# Patient Record
Sex: Female | Born: 1975 | Race: White | Hispanic: Yes | Smoking: Current some day smoker
Health system: Southern US, Community
[De-identification: ages and names within clinical notes are randomized; demographics above are authoritative.]

## PROBLEM LIST (undated history)

## (undated) DIAGNOSIS — G932 Benign intracranial hypertension: Secondary | ICD-10-CM

## (undated) DIAGNOSIS — K760 Fatty (change of) liver, not elsewhere classified: Secondary | ICD-10-CM

## (undated) DIAGNOSIS — F329 Major depressive disorder, single episode, unspecified: Secondary | ICD-10-CM

## (undated) DIAGNOSIS — M797 Fibromyalgia: Secondary | ICD-10-CM

## (undated) DIAGNOSIS — Z9889 Other specified postprocedural states: Secondary | ICD-10-CM

## (undated) DIAGNOSIS — E781 Pure hyperglyceridemia: Secondary | ICD-10-CM

## (undated) DIAGNOSIS — N12 Tubulo-interstitial nephritis, not specified as acute or chronic: Secondary | ICD-10-CM

## (undated) DIAGNOSIS — R112 Nausea with vomiting, unspecified: Secondary | ICD-10-CM

## (undated) DIAGNOSIS — N390 Urinary tract infection, site not specified: Secondary | ICD-10-CM

## (undated) DIAGNOSIS — M1990 Unspecified osteoarthritis, unspecified site: Secondary | ICD-10-CM

## (undated) DIAGNOSIS — I10 Essential (primary) hypertension: Secondary | ICD-10-CM

## (undated) DIAGNOSIS — F32A Depression, unspecified: Secondary | ICD-10-CM

## (undated) DIAGNOSIS — D649 Anemia, unspecified: Secondary | ICD-10-CM

## (undated) DIAGNOSIS — E282 Polycystic ovarian syndrome: Secondary | ICD-10-CM

## (undated) DIAGNOSIS — M5430 Sciatica, unspecified side: Secondary | ICD-10-CM

## (undated) DIAGNOSIS — N7092 Oophoritis, unspecified: Secondary | ICD-10-CM

## (undated) DIAGNOSIS — F419 Anxiety disorder, unspecified: Secondary | ICD-10-CM

## (undated) DIAGNOSIS — K219 Gastro-esophageal reflux disease without esophagitis: Secondary | ICD-10-CM

## (undated) DIAGNOSIS — R519 Headache, unspecified: Secondary | ICD-10-CM

## (undated) HISTORY — DX: Major depressive disorder, single episode, unspecified: F32.9

## (undated) HISTORY — DX: Anxiety disorder, unspecified: F41.9

## (undated) HISTORY — DX: Fatty (change of) liver, not elsewhere classified: K76.0

## (undated) HISTORY — DX: Pure hyperglyceridemia: E78.1

## (undated) HISTORY — DX: Essential (primary) hypertension: I10

## (undated) HISTORY — DX: Depression, unspecified: F32.A

## (undated) HISTORY — DX: Fibromyalgia: M79.7

## (undated) HISTORY — DX: Unspecified osteoarthritis, unspecified site: M19.90

## (undated) HISTORY — DX: Polycystic ovarian syndrome: E28.2

## (undated) HISTORY — PX: TUBAL LIGATION: SHX77

## (undated) HISTORY — PX: OTHER SURGICAL HISTORY: SHX169

---

## 2012-01-28 ENCOUNTER — Emergency Department (HOSPITAL_COMMUNITY)
Admission: EM | Admit: 2012-01-28 | Discharge: 2012-01-28 | Discharge status: Against Medical Advice | Payer: Self-pay | Attending: Emergency Medicine | Admitting: Emergency Medicine

## 2012-01-28 ENCOUNTER — Encounter (HOSPITAL_COMMUNITY): Payer: Self-pay | Admitting: Emergency Medicine

## 2012-01-28 DIAGNOSIS — R109 Unspecified abdominal pain: Secondary | ICD-10-CM | POA: Insufficient documentation

## 2012-01-28 DIAGNOSIS — R55 Syncope and collapse: Secondary | ICD-10-CM | POA: Insufficient documentation

## 2012-01-28 DIAGNOSIS — R110 Nausea: Secondary | ICD-10-CM | POA: Insufficient documentation

## 2012-01-28 HISTORY — DX: Oophoritis, unspecified: N70.92

## 2012-01-28 NOTE — ED Notes (Signed)
Pt states she cannot wait any longer, needs to get home to her children.  Advised to return for worsening of sx.  Pt alert and oriented, ambulatory

## 2012-01-28 NOTE — ED Notes (Signed)
Pt reports right flank pain that radiated to lower right abd and middle right back area onset thursdays past denies dysuria but admits to nausea no vomiting. Had sharp pain this afternoon causing near syncope and patient came for eval of symptoms.

## 2013-05-06 DIAGNOSIS — D352 Benign neoplasm of pituitary gland: Secondary | ICD-10-CM | POA: Insufficient documentation

## 2013-05-26 ENCOUNTER — Ambulatory Visit (INDEPENDENT_AMBULATORY_CARE_PROVIDER_SITE_OTHER): Payer: BC Managed Care – PPO | Admitting: Endocrinology

## 2013-05-26 ENCOUNTER — Encounter: Payer: Self-pay | Admitting: Endocrinology

## 2013-05-26 VITALS — BP 124/70 | HR 86 | Temp 98.2°F | Resp 12 | Ht 62.0 in | Wt 235.9 lb

## 2013-05-26 DIAGNOSIS — E229 Hyperfunction of pituitary gland, unspecified: Secondary | ICD-10-CM

## 2013-05-26 DIAGNOSIS — F329 Major depressive disorder, single episode, unspecified: Secondary | ICD-10-CM

## 2013-05-26 DIAGNOSIS — E221 Hyperprolactinemia: Secondary | ICD-10-CM | POA: Insufficient documentation

## 2013-05-26 NOTE — Progress Notes (Signed)
Patient ID: Angela Massey, female   DOB: [birth year], 37 y.o.   MRN: [id number]   Chief complaint:  Irregular menstrual cycles  History of Present Illness:  The patient stopped having menstrual cycles this summer for about 3 months. Previously she would have fairly regular menstrual cycles although in the early part of the year she had 2 cycles in a month. About 6 weeks ago or so she then started noticing milk discharge from her breasts also. This would be spontaneous and with cause wetting of her underclothing. 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  She does complain of feeling tired but this is not new More recently, for about 6 months she has been getting cold more recently  LABS: Prolactin on 05/06/13 was 143.7 Free T4, TSH, LH, FSH, ACTH, cortisol, estradiol and IGF-1 are normal   She is now referred here for further evaluation by her neurosurgeon   Past Medical History  Diagnosis Date  . Ovarian abscess   . Depression     Past Surgical History  Procedure Laterality Date  . Abdominal surgery    . Tubal ligation      Family History  Problem Relation Age of Onset  . Diabetes Maternal Grandmother     Social History:  reports that she has been smoking.  She does not have any smokeless tobacco history on file. She reports that she does not drink alcohol or use illicit drugs.  Allergies:  Allergies  Allergen Reactions  . Codeine Hives  . Phenergan [Promethazine Hcl]     convulsions      Medication List       This list is accurate as of:  05/26/13  9:53 AM.  Always use your most recent med list.               benztropine 0.5 MG tablet  Commonly known as:  COGENTIN  Take 0.5 mg by mouth daily.     buPROPion 150 MG 12 hr tablet  Commonly known as:  WELLBUTRIN SR  Take 150 mg by mouth 3 (three) times daily.     ferrous sulfate 325 (65 FE) MG tablet  Take 325 mg by mouth daily with breakfast.     FLUoxetine 20 MG capsule  Commonly known as:  PROZAC  Take 60 mg by mouth daily.     furosemide 20 MG tablet  Commonly known as:  LASIX  Take 20 mg by mouth daily.     ibuprofen 200 MG tablet  Commonly known as:  ADVIL,MOTRIN  Take 800 mg by mouth every 6 (six) hours as needed. For pain     pravastatin 20 MG tablet  Commonly known as:  PRAVACHOL  Take 20 mg by mouth daily.     risperiDONE 2 MG tablet  Commonly known as:  RISPERDAL  Take 2 mg by mouth at bedtime. 2 tablets at bedtime     Vitamin D3 5000 UNITS Tabs  Take 5,000 mg by mouth.         REVIEW OF SYSTEMS:  She has had occasional headaches, not worse now.       She may occasionally feel a little dizzy     Eyes: No visual disturbance        Skin: No rash or pigmentation. She has had mild chronic acne. Does not complain of unusual development of facial hair     His blood pressure has been normal.     No swelling of feet.     Bowel habits:  No change. She does complain of mild chronic nausea.      She had difficulty conceiving her first child and was told to have polycystic ovarian syndrome at that time with a normal testosterone level. Most treated with metformin for a short time. Her maximum weight was 260 pounds. She lost about 60 pounds prior to her second pregnancy and had no difficulty with conceiving. Her last child was 6 years ago  No history of diabetes   PHYSICAL EXAM:  BP 124/70  Pulse 86  Temp(Src) 98.2 F (36.8 C)  Resp 12  Ht 5\' 2"  (1.575 m)  Wt 235 lb 14.4 oz (107.004 kg)  BMI 43.14 kg/m2  SpO2 96%  LMP  05/18/2013  GENERAL: Generalized obesity present. No cushingoid features  No pallor, clubbing or edema.  Skin:  no rash or pigmentation.  EYES:  Externally normal.  Fundii:  normal discs and vessels.  ENT: Oral mucosa and tongue normal.  THYROID:  Not palpable. No cervical lymphadenopathy  HEART:  Normal  S1 and S2; no murmur or click.  CHEST:  Normal shape. Lungs:Vescicular breath sounds heard equally.  No crepitations/ wheeze. Breast exam not indicated  ABDOMEN:  No distention. No skin changes. Liver and spleen not palpable.  No other mass or tenderness.  NEUROLOGICAL: .Reflexes are normal bilaterally at biceps.  SPINE AND JOINTS:  Normal.   ASSESSMENT:    Hyperprolactinemia associated with starting Risperdal about 6 months ago Oligomenorrhea and galactorrhea secondary to her hyperprolactinemia Although she missed menstrual cycles for 3 months she has had a menstrual cycle this month Is unclear whether she has a condition secondary to her using Risperdal which is well known to cause high levels of prolactin or she has a coincidental prolactinoma which was asymptomatic previously.  She currently does have problems with chronic nausea and discussed potential treatment of hyperprolactinemia with dopaminergic drugs which would possibly cause nausea also  PLAN:   It would be preferable that she go off Risperdal and try a different antipsychotic/antidepressant drug that would have less propensity to hyperprolactinemia. She would discuss this with her PCP today Will reassess her prolactin levels and symptoms in about 2 months after stopping this medication and consider treatment if prolactin is persistently high Patient and husband agree with this plan  [facility] 05/26/2013, 9:53 AM

## 2013-05-26 NOTE — Patient Instructions (Signed)
Try to have Risperdal changed

## 2013-07-27 ENCOUNTER — Ambulatory Visit (INDEPENDENT_AMBULATORY_CARE_PROVIDER_SITE_OTHER): Payer: BC Managed Care – PPO | Admitting: Endocrinology

## 2013-07-27 ENCOUNTER — Encounter: Payer: Self-pay | Admitting: Endocrinology

## 2013-07-27 VITALS — BP 110/70 | HR 82 | Temp 98.3°F | Resp 16 | Ht 62.0 in | Wt 239.6 lb

## 2013-07-27 DIAGNOSIS — E221 Hyperprolactinemia: Secondary | ICD-10-CM

## 2013-07-27 DIAGNOSIS — E229 Hyperfunction of pituitary gland, unspecified: Secondary | ICD-10-CM

## 2013-07-27 NOTE — Progress Notes (Signed)
Patient ID: Angela Massey, female   DOB: 03/07/1976, 38 y.o.   MRN: [id number]   Chief complaint:  Followup of high prolactin  History of Present Illness:  She was evaluated in 05/2013 for hyperprolactinemia Patient was symptomatic with galactorrhea and also had stopped having menstrual cycles in summer for about 3 months. Previously she would have fairly regular menstrual cycles although in the early part of 2014 she had 2 cycles in a month. 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 Ovarian abscess   . Depression     Past Surgical History  Procedure Laterality Date  . Abdominal surgery    . Tubal ligation      Family History  Problem Relation Age of Onset  . Diabetes Maternal Grandmother     Social History:  reports that she has been smoking.  She does not have any smokeless tobacco history on file. She reports that she does not drink alcohol or use illicit drugs.  Allergies:  Allergies  Allergen Reactions  . Codeine Hives  . Phenergan [Promethazine Hcl]     convulsions      Medication List       This list is accurate as of: 07/27/13  9:17 AM.  Always use your most recent med list.               ARIPiprazole 15 MG tablet  Commonly known as:  ABILIFY  Take 15 mg by mouth daily.     benztropine 0.5 MG tablet  Commonly known as:  COGENTIN  Take 0.5 mg by mouth daily.     buPROPion 150 MG 12 hr tablet  Commonly known as:  WELLBUTRIN SR  Take 150 mg by mouth 3 (three) times daily.     ferrous  sulfate 325 (65 FE) MG tablet  Take 325 mg by mouth daily with breakfast.     FLUoxetine 20 MG capsule  Commonly known as:  PROZAC  Take 60 mg by mouth daily.     furosemide 20 MG tablet  Commonly known as:  LASIX  Take 20 mg by mouth daily.     ibuprofen 200 MG tablet  Commonly known as:  ADVIL,MOTRIN  Take 800 mg by mouth every 6 (six) hours as needed. For pain     pravastatin 20 MG tablet  Commonly known as:  PRAVACHOL  Take 20 mg by mouth daily.     Vitamin D3 5000 UNITS Tabs  Take 5,000 mg by mouth.         REVIEW OF SYSTEMS:            She has had occasional headaches, not worse now     No history of diabetes      Depression is controlled currently  PHYSICAL EXAM:  BP 110/70  Pulse 82  Temp(Src) 98.3 F (36.8 C)  Resp 16  Ht 5\' 2"  (1.575 m)  Wt 239 lb 9.6 oz (108.682 kg)  BMI 43.81 kg/m2  SpO2 98%  LMP  07/24/2013   ASSESSMENT/PLAN:    Hyperprolactinemia likely to have been associated with use of Risperdal in 2014 Oligomenorrhea and galactorrhea secondary to her hyperprolactinemia appears to be resolving and she has regular menstrual cycles now She also does have a known 4 mm possible pituitary adenoma She is currently taking Abilify which is usually not associated with significant prolactin elevation  Will check her prolactin level today and decide on further management   Shernita Rabinovich 07/27/2013, 9:17 AM

## 2013-07-27 NOTE — Patient Instructions (Signed)
Call if skipping cycles regularly

## 2013-07-28 LAB — PROLACTIN: Prolactin: 22.7 ng/mL (ref 4.8–23.3)

## 2013-07-28 NOTE — Progress Notes (Signed)
Quick Note:  Please let patient know that the lab result is normal and no further action needed. To followup only as needed ______

## 2013-07-31 ENCOUNTER — Encounter: Payer: Self-pay | Admitting: *Deleted

## 2014-05-03 ENCOUNTER — Encounter: Payer: Self-pay | Admitting: Endocrinology

## 2014-07-29 ENCOUNTER — Ambulatory Visit: Payer: Self-pay | Admitting: Podiatry

## 2014-08-02 ENCOUNTER — Ambulatory Visit (INDEPENDENT_AMBULATORY_CARE_PROVIDER_SITE_OTHER): Payer: 59

## 2014-08-02 ENCOUNTER — Ambulatory Visit (INDEPENDENT_AMBULATORY_CARE_PROVIDER_SITE_OTHER): Payer: 59 | Admitting: Podiatry

## 2014-08-02 ENCOUNTER — Encounter: Payer: Self-pay | Admitting: Podiatry

## 2014-08-02 VITALS — BP 112/62 | HR 79 | Resp 16

## 2014-08-02 DIAGNOSIS — M722 Plantar fascial fibromatosis: Secondary | ICD-10-CM

## 2014-08-02 MED ORDER — DICLOFENAC SODIUM 75 MG PO TBEC: 75.0000 mg | DELAYED_RELEASE_TABLET | Freq: Two times a day (BID) | ORAL | Status: DC

## 2014-08-02 MED ORDER — TRIAMCINOLONE ACETONIDE 10 MG/ML IJ SUSP
10.0000 mg | Freq: Once | INTRAMUSCULAR | Status: AC
  Administered 2014-08-02: 10 mg

## 2014-08-02 NOTE — Patient Instructions (Signed)

## 2014-08-02 NOTE — Progress Notes (Signed)
Subjective:     Patient ID: Angela Massey, female   DOB: 1975/12/24, 39 y.o.   MRN: [id number]  HPI patient presents with pain in the left heel ration and mild discomfort on the right. Has tried night splint has tried ice and anti-inflammatories without relief and states the pain has worsened over these 3 months and she wants to be active but is unable to   Review of Systems  All other systems reviewed and are negative.      Objective:   Physical Exam  Constitutional: She is oriented to person, place, and time.  Cardiovascular: Intact distal pulses.   Musculoskeletal: Normal range of motion.  Neurological: She is oriented to person, place, and time.  Skin: Skin is warm and dry.  Nursing note and vitals reviewed.  neurovascular status intact with muscle strength adequate and range of motion subtalar midtarsal joint within normal limits. Patient is noted to have good digital perfusion is well oriented 3 with mild equinus condition noted. Patient is noted to have exquisite discomfort plantar aspect left heel at the insertion of the tendon into the calcaneus and mild to moderate discomfort of the right arch     Assessment:     Severe plantar fasciitis left with inflammatory component and arch pain right    Plan:     H&P and x-rays reviewed and today I injected the left plantar fascia 3 mg Kenalog 5 mg Xylocaine and applied fascial brace. Placed on Voltaren 75 mg twice a day and instructed on physical therapy and reappoint one week also discussed long-term orthotic therapy for this patient

## 2014-08-02 NOTE — Progress Notes (Signed)
   Subjective:    Patient ID: Angela Massey, female    DOB: 05-01-1976, 39 y.o.   MRN: [id number]  HPI Comments: "I have pain the heel and some arch"  Patient c/o aching plantar heel and some arch left for 3 months. AM pain. Walking a lot makes worse. Been using night splint-only helped AM. She takes hydrocodone for fibromyalgia and takes Aleve-no help.   Foot Pain Associated symptoms include diaphoresis, fatigue, myalgias and weakness.      Review of Systems  Constitutional: Positive for diaphoresis and fatigue.  HENT: Positive for hearing loss.   Eyes: Positive for visual disturbance.  Musculoskeletal: Positive for myalgias, back pain and gait problem.  Neurological: Positive for weakness and light-headedness.  Hematological: Bruises/bleeds easily.  All other systems reviewed and are negative.      Objective:   Physical Exam        Assessment & Plan:

## 2014-08-05 ENCOUNTER — Ambulatory Visit (INDEPENDENT_AMBULATORY_CARE_PROVIDER_SITE_OTHER): Payer: 59 | Admitting: Podiatry

## 2014-08-05 ENCOUNTER — Encounter: Payer: Self-pay | Admitting: Podiatry

## 2014-08-05 VITALS — BP 105/55 | HR 73 | Resp 16

## 2014-08-05 DIAGNOSIS — M722 Plantar fascial fibromatosis: Secondary | ICD-10-CM

## 2014-08-05 MED ORDER — TRIAMCINOLONE ACETONIDE 10 MG/ML IJ SUSP
10.0000 mg | Freq: Once | INTRAMUSCULAR | Status: AC
  Administered 2014-08-05: 10 mg

## 2014-08-08 NOTE — Progress Notes (Signed)
Subjective:     Patient ID: Angela Massey, female   DOB: 10-Jun-1976, 39 y.o.   MRN: [id number]  HPI patient states that the pain has improved some but it still quite sore with quite a bit a depression of my arch and feeling of instability   Review of Systems     Objective:   Physical Exam Neurovascular status intact with continued discomfort left plantar fascia with improvement more apparent on the right. Patient does have depression of the arch collapse medial longitudinal arch which is contributory    Assessment:     Plantar fasciitis left with inflammation noted and structural deficit of the foot    Plan:     Reviewed physical therapy supportive shoe and reinjected the plantar fascia 3 mg Kenalog 5 mg Xylocaine and also today scanned for custom orthotic devices

## 2014-08-09 ENCOUNTER — Ambulatory Visit: Payer: 59 | Admitting: Podiatry

## 2014-08-20 ENCOUNTER — Ambulatory Visit: Payer: 59 | Admitting: *Deleted

## 2014-08-20 DIAGNOSIS — M722 Plantar fascial fibromatosis: Secondary | ICD-10-CM

## 2014-08-20 NOTE — Patient Instructions (Signed)

## 2014-08-20 NOTE — Progress Notes (Signed)
Patient ID: Angela Massey, female   DOB: April 26, 1976, 39 y.o.   MRN: [id number] PICKING UP MY INSERTS

## 2014-09-14 ENCOUNTER — Ambulatory Visit (INDEPENDENT_AMBULATORY_CARE_PROVIDER_SITE_OTHER): Payer: 59 | Admitting: Podiatry

## 2014-09-14 ENCOUNTER — Encounter: Payer: Self-pay | Admitting: Podiatry

## 2014-09-14 VITALS — BP 120/67 | HR 83 | Resp 18

## 2014-09-14 DIAGNOSIS — M722 Plantar fascial fibromatosis: Secondary | ICD-10-CM

## 2014-09-14 MED ORDER — TRIAMCINOLONE ACETONIDE 10 MG/ML IJ SUSP
10.0000 mg | Freq: Once | INTRAMUSCULAR | Status: AC
  Administered 2014-09-14: 10 mg

## 2014-09-14 NOTE — Progress Notes (Signed)
Subjective:     Patient ID: Angela Massey, female   DOB: 1976-01-25, 39 y.o.   MRN: [id number]  HPI patient presents stating that the heel is not hurting as much on the inside but is hurting more in the center in the outside and it is at a point where she simply cannot bear weight. Patient is obese which is certainly a complicating factor   Review of Systems     Objective:   Physical Exam Neurovascular status intact no change in health history was severe discomfort central and lateral band of the plantar fascia with the medial band been reasonably improved    Assessment:     Improved plantar fasciitis left with pain that still present but not to the same intensity with severe intensity in the lateral fascial band insertional point calcaneus    Plan:     Reviewed condition at great length and due to the intensity of discomfort I recommended immobilization. Patient is injected from the lateral side 3 mg Kenalog 5 mg Xylocaine and air fracture walker was dispensed with instructions on usage. Reappoint to recheck in 4 weeks

## 2014-10-11 ENCOUNTER — Encounter: Payer: Self-pay | Admitting: Podiatry

## 2014-10-11 ENCOUNTER — Ambulatory Visit (INDEPENDENT_AMBULATORY_CARE_PROVIDER_SITE_OTHER): Payer: 59 | Admitting: Podiatry

## 2014-10-11 DIAGNOSIS — M722 Plantar fascial fibromatosis: Secondary | ICD-10-CM | POA: Diagnosis not present

## 2014-10-11 NOTE — Patient Instructions (Signed)
Pre-Operative Instructions  Congratulations, you have decided to take an important step to improving your quality of life.  You can be assured that the doctors of [facility] will be with you every step of the way.  1. Plan to be at the surgery center/hospital at least 1 (one) hour prior to your scheduled time unless otherwise directed by the surgical center/hospital staff.  You must have a responsible adult accompany you, remain during the surgery and drive you home.  Make sure you have directions to the surgical center/hospital and know how to get there on time. 2. For hospital based surgery you will need to obtain a history and physical form from your family physician within 1 month prior to the date of surgery- we will give you a form for you primary physician.  3. We make every effort to accommodate the date you request for surgery.  There are however, times where surgery dates or times have to be moved.  We will contact you as soon as possible if a change in schedule is required.   4. No Aspirin/Ibuprofen for one week before surgery.  If you are on aspirin, any non-steroidal anti-inflammatory medications (Mobic, Aleve, Ibuprofen) you should stop taking it 7 days prior to your surgery.  You make take Tylenol  For pain prior to surgery.  5. Medications- If you are taking daily heart and blood pressure medications, seizure, reflux, allergy, asthma, anxiety, pain or diabetes medications, make sure the surgery center/hospital is aware before the day of surgery so they may notify you which medications to take or avoid the day of surgery. 6. No food or drink after midnight the night before surgery unless directed otherwise by surgical center/hospital staff. 7. No alcoholic beverages 24 hours prior to surgery.  No smoking 24 hours prior to or 24 hours after surgery. 8. Wear loose pants or shorts- loose enough to fit over bandages, boots, and casts. 9. No slip on shoes, sneakers are best. 10. Bring  your boot with you to the surgery center/hospital.  Also bring crutches or a walker if your physician has prescribed it for you.  If you do not have this equipment, it will be provided for you after surgery. 11. If you have not been contracted by the surgery center/hospital by the day before your surgery, call to confirm the date and time of your surgery. 12. Leave-time from work may vary depending on the type of surgery you have.  Appropriate arrangements should be made prior to surgery with your employer. 13. Prescriptions will be provided immediately following surgery by your doctor.  Have these filled as soon as possible after surgery and take the medication as directed. 14. Remove nail polish on the operative foot. 15. Wash the night before surgery.  The night before surgery wash the foot and leg well with the antibacterial soap provided and water paying special attention to beneath the toenails and in between the toes.  Rinse thoroughly with water and dry well with a towel.  Perform this wash unless told not to do so by your physician.  Enclosed: 1 Ice pack (please put in freezer the night before surgery)   1 Hibiclens skin cleaner   Pre-op Instructions  If you have any questions regarding the instructions, do not hesitate to call our office.  [place]: [address] [place], [place] [address] [phone]  [place]: [address] [place], [place] [address] [phone]  [place]: [address]  [place],  [address] [phone]  Dr. Richard   Tuchman DPM, Dr. Norman Regal DPM Dr. Richard Sikora DPM, Dr. M. Todd Hyatt DPM, Dr. Kathryn Egerton DPM 

## 2014-10-12 ENCOUNTER — Ambulatory Visit: Payer: 59 | Admitting: Podiatry

## 2014-10-12 NOTE — Progress Notes (Signed)
Subjective:     Patient ID: Angela Massey, female   DOB: 07-23-1975, 39 y.o.   MRN: [id number]  HPI patient states my left heel is killing me and I need to be able to walk but cannot because of the pain   Review of Systems     Objective:   Physical Exam Neurovascular status intact muscle strength adequate with severe discomfort left plantar heel at the insertional point of the tendon into the calcaneus    Assessment:     Long-term plantar fasciitis left which has not responded to conservative care that we have tried including complete immobilization    Plan:     Reviewed condition and discussed conservative and surgical treatments. Due to long-standing nature and failure to respond she is opted for surgical intervention and I have recommended endoscopic release of the plantar fascia medial and central band. I reviewed with her alternative treatments and complications and she after review wants surgery understanding risk and signs consent form. She is scheduled for outpatient surgery [facility] specialty surgical center at this time and will have done in the next several weeks and was given all preoperative instructions and was encouraged to call us if she should have any questions prior to procedure. Understands total recovery. Can take 6 months to one year

## 2014-10-19 ENCOUNTER — Encounter: Payer: Self-pay | Admitting: Podiatry

## 2014-10-19 DIAGNOSIS — M722 Plantar fascial fibromatosis: Secondary | ICD-10-CM | POA: Diagnosis not present

## 2014-10-21 NOTE — Progress Notes (Signed)
DOS 10/19/2014 left medial and central endoscopic plantar fasciotomy

## 2014-10-25 ENCOUNTER — Encounter: Payer: Self-pay | Admitting: Podiatry

## 2014-10-25 ENCOUNTER — Ambulatory Visit (INDEPENDENT_AMBULATORY_CARE_PROVIDER_SITE_OTHER): Payer: 59 | Admitting: Podiatry

## 2014-10-25 DIAGNOSIS — M722 Plantar fascial fibromatosis: Secondary | ICD-10-CM

## 2014-10-26 NOTE — Progress Notes (Signed)
Subjective:     Patient ID: Angela Massey, female   DOB: 07-17-1975, 39 y.o.   MRN: [id number]  HPI patient states I'm doing really well with minimal discomfort and able to walk without pain or swelling   Review of Systems     Objective:   Physical Exam Neurovascular status intact with in incision sites well coapted upon removal of dressing with stitches in place and negative Homans sign noted left. Minimal discomfort to palpation of the plantar heel    Assessment:     Doing well post endoscopic surgery left    Plan:     Reapplied sterile dressing instructed on continued immobilization elevation and reappoint 2 weeks for suture removal or earlier if any issues should occur

## 2014-11-08 ENCOUNTER — Ambulatory Visit (INDEPENDENT_AMBULATORY_CARE_PROVIDER_SITE_OTHER): Payer: 59 | Admitting: Podiatry

## 2014-11-08 VITALS — BP 114/66 | HR 88 | Resp 14

## 2014-11-08 DIAGNOSIS — M722 Plantar fascial fibromatosis: Secondary | ICD-10-CM

## 2014-11-08 NOTE — Progress Notes (Signed)
   Subjective:    Patient ID: Angela Massey, female    DOB: [birth year], 39 y.o.   MRN: [id number]  HPI Comments: DOS 10/19/2014 left central and medial endoscopic plantar fasciotomy     Review of Systems     Objective:   Physical Exam        Assessment & Plan:

## 2014-11-08 NOTE — Progress Notes (Signed)
   Subjective:    Patient ID: Angela Massey, female    DOB: [birth year], 39 y.o.   MRN: [id number]  HPI Comments: DOS 04/19/'2016 left central and medial endoscopic plantar fasciotomy.  I remove the remaining 2 sutures at the lateral heel surgical site, pt states the medial sutures have fallen out.  I instructed pt to begin showers 24 -48 hours after the suture removal today, and to shower at the end of the day and begin the XXL anklet the following morning after the 1st shower.  I encouraged pt to remain in the Air Fracture Walker until seen in two weeks, then may begin the athletic shoe.     Review of Systems     Objective:   Physical Exam        Assessment & Plan:

## 2014-11-22 ENCOUNTER — Ambulatory Visit (INDEPENDENT_AMBULATORY_CARE_PROVIDER_SITE_OTHER): Payer: 59 | Admitting: Podiatry

## 2014-11-22 DIAGNOSIS — M722 Plantar fascial fibromatosis: Secondary | ICD-10-CM

## 2014-11-23 NOTE — Progress Notes (Signed)
Subjective:     Patient ID: Angela Massey, female   DOB: 01-Mar-1976, 39 y.o.   MRN: [id number]  HPI patient states I'm doing real well with minimal discomfort   Review of Systems     Objective:   Physical Exam Neurovascular status intact wound edges well healed on the medial lateral side left heel post endoscopic surgery    Assessment:     Doing well post endoscopic release medial and central band of the left plantar fascia    Plan:     Advised patient's doing well and to increase activity levels at this time. Continue to wear her boot on an as-needed basis and reappoint if any symptoms were to indicate

## 2015-04-14 ENCOUNTER — Ambulatory Visit (HOSPITAL_COMMUNITY): Payer: Self-pay | Admitting: Psychology

## 2015-05-25 ENCOUNTER — Encounter (HOSPITAL_COMMUNITY): Payer: Self-pay | Admitting: Emergency Medicine

## 2015-05-25 ENCOUNTER — Emergency Department (HOSPITAL_COMMUNITY)
Admission: EM | Admit: 2015-05-25 | Discharge: 2015-05-25 | Disposition: A | Discharge status: Home / Self Care | Payer: 59 | Attending: Emergency Medicine | Admitting: Emergency Medicine

## 2015-05-25 DIAGNOSIS — M5440 Lumbago with sciatica, unspecified side: Secondary | ICD-10-CM | POA: Diagnosis not present

## 2015-05-25 DIAGNOSIS — F319 Bipolar disorder, unspecified: Secondary | ICD-10-CM | POA: Diagnosis not present

## 2015-05-25 DIAGNOSIS — Z8742 Personal history of other diseases of the female genital tract: Secondary | ICD-10-CM | POA: Insufficient documentation

## 2015-05-25 DIAGNOSIS — M5441 Lumbago with sciatica, right side: Secondary | ICD-10-CM

## 2015-05-25 DIAGNOSIS — F17200 Nicotine dependence, unspecified, uncomplicated: Secondary | ICD-10-CM | POA: Diagnosis not present

## 2015-05-25 DIAGNOSIS — M5442 Lumbago with sciatica, left side: Secondary | ICD-10-CM

## 2015-05-25 DIAGNOSIS — M797 Fibromyalgia: Secondary | ICD-10-CM | POA: Insufficient documentation

## 2015-05-25 DIAGNOSIS — M545 Low back pain: Secondary | ICD-10-CM | POA: Diagnosis present

## 2015-05-25 MED ORDER — KETOROLAC TROMETHAMINE 60 MG/2ML IM SOLN
60.0000 mg | Freq: Once | INTRAMUSCULAR | Status: AC
  Administered 2015-05-25: 60 mg via INTRAMUSCULAR
  Filled 2015-05-25: qty 2

## 2015-05-25 MED ORDER — PREDNISONE 20 MG PO TABS: ORAL_TABLET | ORAL | Status: DC

## 2015-05-25 MED ORDER — DIAZEPAM 5 MG PO TABS: 10.0000 mg | ORAL_TABLET | Freq: Once | ORAL | Status: DC

## 2015-05-25 NOTE — ED Notes (Signed)
C/O LBP x 1.5 weeks . Saw an orthopedist then. States pain has gotten worse.

## 2015-05-25 NOTE — ED Notes (Signed)
Patient states back pain x 1 week with worsening every day.   Patient states the pain started radiating down into legs x 2 days ago.   Patient is with pain clinic for chronic problems (fibromyalgia).  Patient denies any urinary symptoms or loss of continence.

## 2015-05-25 NOTE — ED Provider Notes (Signed)
CSN: [id number]     Arrival date & time 05/25/15  1113 History  By signing my name below, I, Evelene Croon, attest that this documentation has been prepared under the direction and in the presence of non-physician practitioner, Clayton Bibles, PA-C. Electronically Signed: Evelene Croon, Scribe. 05/25/2015. 11:29 AM.  Chief Complaint  Patient presents with  . Back Pain  . Leg Pain   The history is provided by the patient. No language interpreter was used.   HPI Comments:  Angela Massey is a 39 y.o. female with a history of fibromyalgia, who presents to the Emergency Department complaining of moderate low back pain that radiates down her posterior BLE for a few weeks, worsening yesterday. She denies back injury and notes her pain is greater on the right than the left. She notes increased pain with movement and when bending over. Pt notes she has a pain contract and is taking oxycodone, gabapentin, meloxicam, Cymbalta, and flexeril which she has been taking with little relief. Pt also reports swelling to her BLE since yesterday. She has a h/o similar milder swelling but notes it usually resolves on its own. She has been seen by ortho and was told she had a sprain of her right ankle and was discharged with steroids which she finished yesterday. She denies SOB, CP, fever, abdominal pain, urinary symptoms, weakness in her extremites and bowel/bladder incontinence. She also denies h/o surgery to her lymph nodes, recent long periods of immobiliztion, recent surgery, hormone replacement therapy, family hx or personal hx of blood clots .  Past Medical History  Diagnosis Date  . Ovarian abscess   . Depression   . Bipolar 1 disorder ([place]) 10/2014   Past Surgical History  Procedure Laterality Date  . Abdominal surgery    . Tubal ligation     Family History  Problem Relation Age of Onset  . Diabetes Maternal Grandmother    Social History  Substance Use Topics  . Smoking status: Current Every Day  Smoker  . Smokeless tobacco: Current User  . Alcohol Use: No   OB History    Gravida Para Term Preterm AB TAB SAB Ectopic Multiple Living   4 3 3       3      Review of Systems  Constitutional: Negative for fever and chills.  Respiratory: Negative for shortness of breath.   Cardiovascular: Negative for chest pain.    Allergies  Codeine and Phenergan  Home Medications   Prior to Admission medications   Medication Sig Start Date End Date Taking? Authorizing Provider  cyclobenzaprine (FLEXERIL) 10 MG tablet As needed 10/07/14   Historical Provider, MD  Divalproex Sodium (DEPAKOTE ER PO) Take 1,000 mg by mouth daily. At bedtime    Historical Provider, MD  HYDROcodone-acetaminophen (NORCO) 7.5-325 MG per tablet Take 1 tablet by mouth every 6 (six) hours as needed for moderate pain.    Historical Provider, MD  lamoTRIgine (LAMICTAL) 25 MG tablet Take 25 mg by mouth daily. Every other night for a couple of weeks    Historical Provider, MD   BP 115/67 mmHg  Pulse 97  Temp(Src) 98.2 F (36.8 C) (Oral)  Resp 20  Ht 5\' 2"  (1.575 m)  Wt 280 lb (127.007 kg)  BMI 51.20 kg/m2  SpO2 98%  LMP 05/02/2015 Physical Exam  Constitutional: She is oriented to person, place, and time. She appears well-developed and well-nourished. No distress.  HENT:  Head: Normocephalic and atraumatic.  Neck: Neck supple.  Cardiovascular: Normal rate,  regular rhythm and intact distal pulses.   Pulmonary/Chest: Effort normal and breath sounds normal. No respiratory distress. She has no wheezes. She has no rales.  Abdominal: Soft. She exhibits no distension and no mass. There is no tenderness. There is no rebound and no guarding.  Musculoskeletal:  Bilateral pitting edma mid shin Tenderness across lower back   Spine nontender, no crepitus, or stepoffs. Lower extremities:  Strength 5/5, sensation intact, distal pulses intact.     Neurological: She is alert and oriented to person, place, and time.  Skin: She is  not diaphoretic.  Nursing note and vitals reviewed.   ED Course  Procedures   DIAGNOSTIC STUDIES:  Oxygen Saturation is 98% on RA, normal by my interpretation.    COORDINATION OF CARE:  12:28 PM Discussed treatment plan with pt at bedside and pt agreed to plan.   MDM   Final diagnoses:  Bilateral low back pain with sciatica, sciatica laterality unspecified     Afebrile, nontoxic patient with mechanical low back pain without injury. No red flags. Xrays at orthopedic office reported to be negative.  Pain does radiate down the backs of both of her legs.  She has been seen by orthopedics for this.  She has intermittent peripheral edema and has peripheral edema today.  Her calves are equivalent in size and she has intact and equal distal pulses.  NO risk factors known for DVT.  Pain is worse with movement.  Suspect muscle strain, possible lumbar radiculopathy.  Pt is in pain management and it already on norco, flexeril, gabapentin, and has been prescribed mobic for her pain.  Toradol given in ED.  She has also taken 4-5 days of prednisone and has finished this treatment.  Will extend steroid taper.  Pt to follow up with PCP.  Discussed result, findings, treatment, and follow up  with patient.  Pt given return precautions.  Pt verbalizes understanding and agrees with plan.       I personally performed the services described in this documentation, which was scribed in my presence. The recorded information has been reviewed and is accurate.    Clayton Bibles, PA-C 05/25/15 1342  Pattricia Boss, MD 05/25/15 [phone]

## 2015-05-25 NOTE — Discharge Instructions (Signed)
Read the information below.  Use the prescribed medication as directed.  Please discuss all new medications with your pharmacist.  You may return to the Emergency Department at any time for worsening condition or any new symptoms that concern you.    If you develop fevers, loss of control of bowel or bladder, weakness or numbness in your legs, or are unable to walk, return to the ER for a recheck.  °

## 2015-10-06 ENCOUNTER — Ambulatory Visit (HOSPITAL_COMMUNITY): Payer: 59 | Admitting: Psychiatry

## 2015-10-11 ENCOUNTER — Encounter (HOSPITAL_COMMUNITY): Payer: Self-pay | Admitting: Psychiatry

## 2015-10-11 ENCOUNTER — Ambulatory Visit (INDEPENDENT_AMBULATORY_CARE_PROVIDER_SITE_OTHER): Payer: 59 | Admitting: Psychiatry

## 2015-10-11 ENCOUNTER — Encounter (INDEPENDENT_AMBULATORY_CARE_PROVIDER_SITE_OTHER): Payer: Self-pay

## 2015-10-11 VITALS — BP 110/62 | HR 84 | Ht 62.0 in | Wt 270.0 lb

## 2015-10-11 DIAGNOSIS — G4700 Insomnia, unspecified: Secondary | ICD-10-CM | POA: Diagnosis not present

## 2015-10-11 DIAGNOSIS — F331 Major depressive disorder, recurrent, moderate: Secondary | ICD-10-CM | POA: Diagnosis not present

## 2015-10-11 DIAGNOSIS — F411 Generalized anxiety disorder: Secondary | ICD-10-CM | POA: Diagnosis not present

## 2015-10-11 DIAGNOSIS — F410 Panic disorder [episodic paroxysmal anxiety] without agoraphobia: Secondary | ICD-10-CM | POA: Diagnosis not present

## 2015-10-11 MED ORDER — BUPROPION HCL ER (XL) 150 MG PO TB24: 150.0000 mg | ORAL_TABLET | Freq: Every day | ORAL | Status: DC
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
## 2015-12-04 ENCOUNTER — Other Ambulatory Visit (HOSPITAL_COMMUNITY): Payer: Self-pay | Admitting: Psychiatry

## 2015-12-07 ENCOUNTER — Other Ambulatory Visit: Payer: Self-pay | Admitting: Physical Medicine and Rehabilitation

## 2015-12-07 ENCOUNTER — Ambulatory Visit
Admission: RE | Admit: 2015-12-07 | Discharge: 2015-12-07 | Disposition: A | Discharge status: Home / Self Care | Payer: 59 | Source: Ambulatory Visit | Attending: Physical Medicine and Rehabilitation | Admitting: Physical Medicine and Rehabilitation

## 2015-12-07 DIAGNOSIS — M545 Low back pain: Secondary | ICD-10-CM

## 2015-12-13 ENCOUNTER — Ambulatory Visit (HOSPITAL_COMMUNITY): Payer: Self-pay | Admitting: Psychiatry

## 2015-12-29 ENCOUNTER — Other Ambulatory Visit (HOSPITAL_COMMUNITY): Payer: Self-pay | Admitting: Psychiatry

## 2016-02-02 ENCOUNTER — Encounter (HOSPITAL_COMMUNITY): Payer: Self-pay | Admitting: Psychiatry

## 2016-02-02 ENCOUNTER — Ambulatory Visit (HOSPITAL_COMMUNITY): Payer: 59 | Admitting: Psychiatry

## 2016-02-02 ENCOUNTER — Other Ambulatory Visit (HOSPITAL_COMMUNITY): Payer: Self-pay

## 2016-02-02 DIAGNOSIS — F410 Panic disorder [episodic paroxysmal anxiety] without agoraphobia: Secondary | ICD-10-CM

## 2016-02-02 DIAGNOSIS — F17210 Nicotine dependence, cigarettes, uncomplicated: Secondary | ICD-10-CM

## 2016-02-02 DIAGNOSIS — G4700 Insomnia, unspecified: Secondary | ICD-10-CM | POA: Diagnosis not present

## 2016-02-02 DIAGNOSIS — F331 Major depressive disorder, recurrent, moderate: Secondary | ICD-10-CM

## 2016-02-02 DIAGNOSIS — F411 Generalized anxiety disorder: Secondary | ICD-10-CM

## 2016-02-02 DIAGNOSIS — Z79899 Other long term (current) drug therapy: Secondary | ICD-10-CM

## 2016-02-02 MED ORDER — BUPROPION HCL ER (XL) 300 MG PO TB24: 300.0000 mg | ORAL_TABLET | Freq: Every day | ORAL | 3 refills | Status: DC

## 2016-02-02 MED ORDER — OLANZAPINE 2.5 MG PO TABS: 2.5000 mg | ORAL_TABLET | Freq: Every day | ORAL | 3 refills | Status: DC
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
## 2016-02-02 NOTE — Patient Instructions (Signed)
Call [phone] to schedule EKG before starting Zyprexa

## 2016-02-03 ENCOUNTER — Ambulatory Visit (HOSPITAL_COMMUNITY)
Admission: RE | Admit: 2016-02-03 | Discharge: 2016-02-03 | Disposition: A | Discharge status: Home / Self Care | Payer: 59 | Source: Ambulatory Visit | Attending: Psychiatry | Admitting: Psychiatry

## 2016-02-03 DIAGNOSIS — Z79899 Other long term (current) drug therapy: Secondary | ICD-10-CM | POA: Insufficient documentation

## 2016-02-03 DIAGNOSIS — R9431 Abnormal electrocardiogram [ECG] [EKG]: Secondary | ICD-10-CM | POA: Diagnosis not present

## 2016-02-03 DIAGNOSIS — Z5181 Encounter for therapeutic drug level monitoring: Secondary | ICD-10-CM | POA: Insufficient documentation

## 2016-02-15 ENCOUNTER — Other Ambulatory Visit: Payer: Self-pay | Admitting: Vascular Surgery

## 2016-02-15 DIAGNOSIS — I7092 Chronic total occlusion of artery of the extremities: Secondary | ICD-10-CM

## 2016-02-18 ENCOUNTER — Other Ambulatory Visit (HOSPITAL_COMMUNITY): Payer: Self-pay | Admitting: Psychiatry

## 2016-04-03 ENCOUNTER — Encounter: Payer: Self-pay | Admitting: Vascular Surgery

## 2016-04-05 ENCOUNTER — Encounter: Payer: Self-pay | Admitting: Vascular Surgery

## 2016-04-05 ENCOUNTER — Ambulatory Visit (INDEPENDENT_AMBULATORY_CARE_PROVIDER_SITE_OTHER): Payer: 59 | Admitting: Vascular Surgery

## 2016-04-05 ENCOUNTER — Ambulatory Visit (HOSPITAL_COMMUNITY)
Admission: RE | Admit: 2016-04-05 | Discharge: 2016-04-05 | Disposition: A | Discharge status: Home / Self Care | Payer: 59 | Source: Ambulatory Visit | Attending: Vascular Surgery | Admitting: Vascular Surgery

## 2016-04-05 VITALS — BP 140/85 | HR 88 | Temp 98.7°F | Resp 20 | Ht 62.0 in | Wt 258.0 lb

## 2016-04-05 DIAGNOSIS — I7092 Chronic total occlusion of artery of the extremities: Secondary | ICD-10-CM | POA: Diagnosis not present

## 2016-04-05 DIAGNOSIS — R011 Cardiac murmur, unspecified: Secondary | ICD-10-CM | POA: Diagnosis not present

## 2016-04-05 DIAGNOSIS — G609 Hereditary and idiopathic neuropathy, unspecified: Secondary | ICD-10-CM

## 2016-04-05 DIAGNOSIS — R0989 Other specified symptoms and signs involving the circulatory and respiratory systems: Secondary | ICD-10-CM | POA: Diagnosis present

## 2016-04-05 DIAGNOSIS — Z720 Tobacco use: Secondary | ICD-10-CM | POA: Insufficient documentation

## 2016-04-05 NOTE — Progress Notes (Signed)
Referring Physician: Dr Greta Doom  Patient name: Angela Massey MRN: [id number] DOB: Jun 03, 1976 Sex: female  REASON FOR CONSULT: Leg pain with occasional numbness  HPI: Angela Massey is a 40 y.o. female referred for evaluation of bilateral leg pain which occurs primarily when standing still for long periods of time. This is been present for greater than a year. She also occasionally has some numbness and tingling that occurs in her feet primarily with sitting. She does not really describe claudication or rest pain symptoms. Her primary risk factor is smoking. Greater than 3 minutes they were spent regarding smoking cessation counseling. She also has a history of hypertension hypertriglycerides fatty liver arthritis anxiety and depression all of which are currently stable.   Past Medical History:  Diagnosis Date  . Anxiety   . Arthritis    back and ankles  . Depression   . Fatty liver   . Fibromyalgia   . Hypertension   . Hypertriglyceridemia   . Ovarian abscess   . PCOS (polycystic ovarian syndrome)    Past Surgical History:  Procedure Laterality Date  . ABDOMINAL SURGERY    . planter facitis    . TUBAL LIGATION      Family History  Problem Relation Age of Onset  . Diabetes Maternal Grandmother   . Depression Mother   . Anxiety disorder Mother   . Alcohol abuse Father     SOCIAL HISTORY: Social History   Social History  . Marital status: Married    Spouse name: N/A  . Number of children: 6  . Years of education: 40   Occupational History  . manager [employer]   Social History Main Topics  . Smoking status: Current Every Day Smoker    Packs/day: 0.50    Types: Cigarettes  . Smokeless tobacco: Never Used  . Alcohol use 0.0 oz/week     Comment: 1-2 drinks per year  . Drug use: No  . Sexual activity: Yes   Other Topics Concern  . Not on file   Social History Narrative   Born in [place] and raised there until 10 by mom and step dad. Biological parents  divorced when she was 39mo. She then moved to [place] states. At 40yo she moved to [place] by herself for [employer] and then moved to [place] and lived there for 47ys. Pt has one older brother and one older sister. Pt has an associates degree in Energy manager. 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 Pt has been married 3x. Current marriage for 10 yrs and has 3 biological kids and 3 adopted kids.     Allergies  Allergen Reactions  . Codeine Hives  . Phenergan [Promethazine Hcl]     convulsions  . Seroquel [Quetiapine Fumarate]     Caused severe hypotension    Current Outpatient Prescriptions  Medication Sig Dispense Refill  . buPROPion (WELLBUTRIN XL) 300 MG 24 hr tablet Take 1 tablet (300 mg total) by mouth daily. 30 tablet 3  . cyclobenzaprine (FLEXERIL) 10 MG tablet As needed  0  . gabapentin (NEURONTIN) 300 MG capsule Take 300 mg by mouth 3 (three) times daily.    . meloxicam (MOBIC) 7.5 MG tablet Take 7.5 mg by mouth daily.    . naproxen sodium (ANAPROX) 220 MG tablet Take 220 mg by mouth 2 (two) times daily with a meal. Reported on 10/11/2015    . OLANZapine (ZYPREXA) 2.5 MG tablet Take 1 tablet (2.5  mg total) by mouth at bedtime. 30 tablet 3  . oxyCODONE (ROXICODONE) 15 MG immediate release tablet Take 5 mg by mouth every 4 (four) hours as needed for pain.     . predniSONE (DELTASONE) 20 MG tablet 3 tabs po daily x 3 days, then 2 tabs x 3 days, then 1.5 tabs x 3 days, then 1 tab x 3 days, then 0.5 tabs x 3 days (Patient not taking: Reported on 02/02/2016) 27 tablet 0   No current facility-administered medications for this visit.     ROS:   General:  No weight loss, Fever, chills  HEENT: No recent headaches, no nasal bleeding, no visual changes, no sore throat  Neurologic: No dizziness, blackouts, seizures. No recent symptoms of stroke or mini- stroke. No recent episodes of slurred speech, or temporary blindness.  Cardiac: No recent episodes of chest  pain/pressure, no shortness of breath at rest.  No shortness of breath with exertion.  Denies history of atrial fibrillation or irregular heartbeat  Vascular: No history of rest pain in feet.  No history of claudication.  No history of non-healing ulcer, No history of DVT   Pulmonary: No home oxygen, no productive cough, no hemoptysis,  No asthma or wheezing  Musculoskeletal:  [X]  Arthritis, [X]  Low back pain,  [X]  Joint pain  Hematologic:No history of hypercoagulable state.  No history of easy bleeding.  No history of anemia  Gastrointestinal: No hematochezia or melena,  No gastroesophageal reflux, no trouble swallowing  Urinary: [ ]  chronic Kidney disease, [ ]  on HD - [ ]  MWF or [ ]  TTHS, [ ]  Burning with urination, [ ]  Frequent urination, [ ]  Difficulty urinating;   Skin: No rashes  Psychological: No history of anxiety,  No history of depression   Physical Examination  Vitals:   04/05/16 1252  BP: 140/85  Pulse: 88  Resp: 20  Temp: 98.7 F (37.1 C)  TempSrc: Oral  SpO2: 96%  Weight: 258 lb (117 kg)  Height: 5\' 2"  (1.575 m)    Body mass index is 47.19 kg/m.  General:  Alert and oriented, no acute distress HEENT: Normal Neck: No bruit or JVD Pulmonary: Clear to auscultation bilaterally Cardiac: Regular Rate and Rhythm with 3/6 systolic murmur Abdomen: Soft, non-tender, non-distended, no mass, no scars Skin: No rash, no ulcers Extremity Pulses:  2+ radial, brachial, femoral, Absent dorsalis pedis, 2+ posterior tibial pulses bilaterally Musculoskeletal: No deformity or edema  Neurologic: Upper and lower extremity motor 5/5 and symmetric  DATA:  Patient had bilateral ABIs performed today which were greater than 1 triphasic normal waveforms bilaterally  ASSESSMENT:  Patient with bilateral leg pain occasional intermittent numbness and tingling but with palpable posterior tibial pulses and normal noninvasive arterial exam today. Her overall exam is not consistent with  peripheral arterial disease. She was counseled against smoking to prevent this in the future. Patient also was noted to have a systolic cardiac murmur. She states that this has not been evaluated in the past. We will order an echocardiogram for her for further evaluation of this.   PLAN:  #1 no evidence of peripheral arterial disease follow-up on as-needed basis #2 systolic cardiac murmur echocardiogram ordered follow-up as needed depending on echo results.   Ruta Hinds, MD [facility] Office: [phone] Pager: [phone]

## 2016-04-06 ENCOUNTER — Other Ambulatory Visit: Payer: Self-pay

## 2016-04-06 DIAGNOSIS — R011 Cardiac murmur, unspecified: Secondary | ICD-10-CM

## 2016-04-30 DIAGNOSIS — D72829 Elevated white blood cell count, unspecified: Secondary | ICD-10-CM | POA: Diagnosis not present

## 2016-05-10 ENCOUNTER — Ambulatory Visit (HOSPITAL_COMMUNITY): Payer: Self-pay | Admitting: Psychiatry

## 2016-05-17 DIAGNOSIS — E611 Iron deficiency: Secondary | ICD-10-CM | POA: Diagnosis not present

## 2016-05-17 DIAGNOSIS — D72829 Elevated white blood cell count, unspecified: Secondary | ICD-10-CM | POA: Diagnosis not present

## 2016-05-17 DIAGNOSIS — R7989 Other specified abnormal findings of blood chemistry: Secondary | ICD-10-CM | POA: Diagnosis not present

## 2016-05-20 ENCOUNTER — Other Ambulatory Visit (HOSPITAL_COMMUNITY): Payer: Self-pay | Admitting: Psychiatry

## 2016-05-20 DIAGNOSIS — F410 Panic disorder [episodic paroxysmal anxiety] without agoraphobia: Secondary | ICD-10-CM

## 2016-05-20 DIAGNOSIS — F331 Major depressive disorder, recurrent, moderate: Secondary | ICD-10-CM

## 2016-05-20 DIAGNOSIS — G4700 Insomnia, unspecified: Secondary | ICD-10-CM

## 2016-05-20 DIAGNOSIS — F411 Generalized anxiety disorder: Secondary | ICD-10-CM

## 2016-05-20 DIAGNOSIS — F17210 Nicotine dependence, cigarettes, uncomplicated: Secondary | ICD-10-CM

## 2016-06-21 ENCOUNTER — Encounter (HOSPITAL_COMMUNITY): Payer: Self-pay | Admitting: Psychiatry

## 2016-06-21 ENCOUNTER — Ambulatory Visit (INDEPENDENT_AMBULATORY_CARE_PROVIDER_SITE_OTHER): Payer: 59 | Admitting: Psychiatry

## 2016-06-21 DIAGNOSIS — G4701 Insomnia due to medical condition: Secondary | ICD-10-CM

## 2016-06-21 DIAGNOSIS — Z833 Family history of diabetes mellitus: Secondary | ICD-10-CM

## 2016-06-21 DIAGNOSIS — Z79891 Long term (current) use of opiate analgesic: Secondary | ICD-10-CM

## 2016-06-21 DIAGNOSIS — F411 Generalized anxiety disorder: Secondary | ICD-10-CM | POA: Diagnosis not present

## 2016-06-21 DIAGNOSIS — F410 Panic disorder [episodic paroxysmal anxiety] without agoraphobia: Secondary | ICD-10-CM

## 2016-06-21 DIAGNOSIS — F331 Major depressive disorder, recurrent, moderate: Secondary | ICD-10-CM

## 2016-06-21 DIAGNOSIS — Z7982 Long term (current) use of aspirin: Secondary | ICD-10-CM

## 2016-06-21 DIAGNOSIS — F17210 Nicotine dependence, cigarettes, uncomplicated: Secondary | ICD-10-CM | POA: Diagnosis not present

## 2016-06-21 DIAGNOSIS — Z79899 Other long term (current) drug therapy: Secondary | ICD-10-CM

## 2016-06-21 DIAGNOSIS — Z888 Allergy status to other drugs, medicaments and biological substances status: Secondary | ICD-10-CM

## 2016-06-21 DIAGNOSIS — Z811 Family history of alcohol abuse and dependence: Secondary | ICD-10-CM

## 2016-06-21 DIAGNOSIS — Z818 Family history of other mental and behavioral disorders: Secondary | ICD-10-CM

## 2016-06-21 MED ORDER — BUPROPION HCL ER (XL) 300 MG PO TB24: 300.0000 mg | ORAL_TABLET | Freq: Every day | ORAL | 3 refills | Status: DC

## 2016-06-21 MED ORDER — OLANZAPINE 5 MG PO TABS: 5.0000 mg | ORAL_TABLET | Freq: Every day | ORAL | 3 refills | Status: DC
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
## 2016-07-24 ENCOUNTER — Ambulatory Visit: Payer: 59 | Admitting: Medical

## 2016-08-06 ENCOUNTER — Encounter (HOSPITAL_COMMUNITY): Payer: Self-pay | Admitting: Radiology

## 2016-08-06 ENCOUNTER — Telehealth (HOSPITAL_COMMUNITY): Payer: Self-pay | Admitting: Radiology

## 2016-08-06 NOTE — Telephone Encounter (Signed)
Angela Massey

## 2016-08-07 ENCOUNTER — Telehealth (HOSPITAL_COMMUNITY): Payer: Self-pay

## 2016-08-07 NOTE — Telephone Encounter (Signed)
Patient is calling because her new insurance will not cover Wellbutrin or Zyprexa. Patient states they will cover Prozac and amitriptyline. She would like to go back on these medications. Please review and advise, thank you.   Patient is using [facility] in [place].

## 2016-08-07 NOTE — Telephone Encounter (Signed)
Is there any way to get her patient assistance?

## 2016-08-14 NOTE — Telephone Encounter (Signed)
I have left patient a couple of messages to call me back, I will try again today.

## 2016-08-16 NOTE — Telephone Encounter (Signed)
Called patient again today and lvm for her to call me back.

## 2016-09-13 ENCOUNTER — Telehealth (HOSPITAL_COMMUNITY): Payer: Self-pay | Admitting: Vascular Surgery

## 2016-09-13 NOTE — Telephone Encounter (Signed)
User: Lenard Galloway V Date/time: 08/06/2016 11:07 AM  Comment: and sent letter 2/5 remove from [place] 2/15  Context: [place] Schedule Orders/Appt Requests Outcome: Left Message  Phone number: [phone] Phone Type: Home Phone  Comm. type: Telephone Call type: Outgoing  Contact: Orvilla Cornwall M Relation to patient: Self  Letter:

## 2016-09-20 ENCOUNTER — Encounter (HOSPITAL_COMMUNITY): Payer: Self-pay | Admitting: Psychiatry

## 2016-09-20 ENCOUNTER — Ambulatory Visit (INDEPENDENT_AMBULATORY_CARE_PROVIDER_SITE_OTHER): Payer: No Typology Code available for payment source | Admitting: Psychiatry

## 2016-09-20 DIAGNOSIS — Z818 Family history of other mental and behavioral disorders: Secondary | ICD-10-CM | POA: Diagnosis not present

## 2016-09-20 DIAGNOSIS — F331 Major depressive disorder, recurrent, moderate: Secondary | ICD-10-CM | POA: Diagnosis not present

## 2016-09-20 DIAGNOSIS — Z811 Family history of alcohol abuse and dependence: Secondary | ICD-10-CM

## 2016-09-20 DIAGNOSIS — F410 Panic disorder [episodic paroxysmal anxiety] without agoraphobia: Secondary | ICD-10-CM | POA: Diagnosis not present

## 2016-09-20 DIAGNOSIS — F17210 Nicotine dependence, cigarettes, uncomplicated: Secondary | ICD-10-CM | POA: Diagnosis not present

## 2016-09-20 DIAGNOSIS — Z79891 Long term (current) use of opiate analgesic: Secondary | ICD-10-CM | POA: Diagnosis not present

## 2016-09-20 DIAGNOSIS — Z79899 Other long term (current) drug therapy: Secondary | ICD-10-CM | POA: Diagnosis not present

## 2016-09-20 DIAGNOSIS — G4701 Insomnia due to medical condition: Secondary | ICD-10-CM

## 2016-09-20 DIAGNOSIS — F411 Generalized anxiety disorder: Secondary | ICD-10-CM

## 2016-09-20 DIAGNOSIS — Z7982 Long term (current) use of aspirin: Secondary | ICD-10-CM

## 2016-09-20 MED ORDER — BUPROPION HCL ER (XL) 300 MG PO TB24: 300.0000 mg | ORAL_TABLET | Freq: Every day | ORAL | 3 refills | Status: DC

## 2016-09-20 MED ORDER — LORAZEPAM 1 MG PO TABS: 1.0000 mg | ORAL_TABLET | Freq: Every day | ORAL | 1 refills | Status: AC | PRN

## 2016-09-20 MED ORDER — OLANZAPINE 10 MG PO TABS: 10.0000 mg | ORAL_TABLET | Freq: Every day | ORAL | 1 refills | Status: DC

## 2016-09-20 NOTE — Progress Notes (Signed)
BH MD/PA/NP OP Progress Note  09/20/2016 9:18 AM Angela Massey  MRN:  [id number]  Chief Complaint:  Chief Complaint    Follow-up     HPI:  Here with husband.  Pt states she is irritable all the time. If she is not irritable then she is crying. She is snapping at her husband and kids daily. States she has a very stress tolerance. Pt does not know why she is anxious.  Pt wants to get on Klonopin.  Sleep is poor and energy is low.  Depression is getting worse. States she is not happy. She feels like she is going crazy. Appetite is variable and some days she over eats. She does not want to wake up or face the day. She wakes most mornings with stress induced panic attack. Pt spends the day working on the house and her studies. It is hard for her come out her bedroom. Denies SI/HI at this time. States she had some passive SI without plan or intent a few weeks ago.   Pt has stress induced panic attacks during the day several times a week.  Taking meds as prescribed and denies SE. She stopped Wellbutrin and Zyprexa for 2+ week and noticed she was unmotivated and very depressed.     Visit Diagnosis:    ICD-9-CM ICD-10-CM   1. MDD (major depressive disorder), recurrent episode, moderate (HCC) 296.32 F33.1 OLANZapine (ZYPREXA) 10 MG tablet     buPROPion (WELLBUTRIN XL) 300 MG 24 hr tablet  2. GAD (generalized anxiety disorder) 300.02 F41.1 OLANZapine (ZYPREXA) 10 MG tablet     LORazepam (ATIVAN) 1 MG tablet  3. Panic disorder without agoraphobia 300.01 F41.0 OLANZapine (ZYPREXA) 10 MG tablet     LORazepam (ATIVAN) 1 MG tablet  4. Insomnia due to medical condition 327.01 G47.01 OLANZapine (ZYPREXA) 10 MG tablet  5. Cigarette nicotine dependence without complication 630.1 S01.093 buPROPion (WELLBUTRIN XL) 300 MG 24 hr tablet    Past Psychiatric History: see H&P  Past Medical History:  Past Medical History:  Diagnosis Date  . Anxiety   . Arthritis    back and ankles  . Depression   .  Fatty liver   . Fibromyalgia   . Hypertension   . Hypertriglyceridemia   . Ovarian abscess   . PCOS (polycystic ovarian syndrome)     Past Surgical History:  Procedure Laterality Date  . ABDOMINAL SURGERY    . planter facitis    . TUBAL LIGATION      Family Psychiatric History:  Family History  Problem Relation Age of Onset  . Diabetes Maternal Grandmother   . Depression Mother   . Anxiety disorder Mother   . Alcohol abuse Father     Social History:  Social History   Social History  . Marital status: Married    Spouse name: N/A  . Number of children: 6  . Years of education: 15   Occupational History  . manager [employer]   Social History Main Topics  . Smoking status: Current Every Day Smoker    Packs/day: 1.00    Years: 10.00    Types: Cigarettes  . Smokeless tobacco: Never Used     Comment: Plans to quit with New Year's Resolution   . Alcohol use 0.0 oz/week     Comment: 1-2 drinks per year  . Drug use: No  . Sexual activity: Yes    Partners: Male    Birth control/ protection: Surgical   Other Topics Concern  .  None   Social History Narrative   Born in [place] and raised there until 10 by mom and step dad. Biological parents divorced when she was 85mo. She then moved to [place]. At 41yo she moved to [place] by herself for [employer] and then moved to [place] and lived there for 12ys. Pt has one older brother and one older sister. Pt has an associates degree in Energy manager. 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 Pt has been married 3x. Current marriage for 10 yrs and has 3 biological kids and 3 adopted kids.     Allergies:  Allergies  Allergen Reactions  . Codeine Hives  . Phenergan [Promethazine Hcl]     convulsions  . Seroquel [Quetiapine Fumarate]     Caused severe hypotension    Metabolic Disorder Labs: No results found for: HGBA1C, MPG Lab Results  Component Value Date   PROLACTIN 22.7 07/27/2013    No results found for: CHOL, TRIG, HDL, CHOLHDL, VLDL, LDLCALC   Current Medications: Current Outpatient Prescriptions  Medication Sig Dispense Refill  . aspirin-acetaminophen-caffeine (EXCEDRIN MIGRAINE) 250-250-65 MG tablet Take 1 tablet by mouth every 6 (six) hours as needed for headache (Takes 1-2 times daily).    Marland Kitchen buPROPion (WELLBUTRIN XL) 300 MG 24 hr tablet Take 1 tablet (300 mg total) by mouth daily. 30 tablet 3  . cyclobenzaprine (FLEXERIL) 10 MG tablet As needed  0  . gabapentin (NEURONTIN) 300 MG capsule Take 300 mg by mouth 3 (three) times daily.    . naproxen sodium (ANAPROX) 220 MG tablet Take 220 mg by mouth 2 (two) times daily with a meal. Reported on 10/11/2015    . OLANZapine (ZYPREXA) 5 MG tablet Take 1 tablet (5 mg total) by mouth at bedtime. 30 tablet 3  . oxyCODONE (ROXICODONE) 15 MG immediate release tablet Take 5 mg by mouth every 4 (four) hours as needed for pain.      No current facility-administered medications for this visit.      Musculoskeletal: Strength & Muscle Tone: within normal limits Gait & Station: normal Patient leans: N/A  Psychiatric Specialty Exam: Review of Systems  Cardiovascular: Positive for leg swelling. Negative for chest pain, palpitations and orthopnea.  Musculoskeletal: Positive for back pain and joint pain. Negative for falls and neck pain.  Neurological: Positive for dizziness and headaches. Negative for tremors, sensory change, seizures and loss of consciousness.    There were no vitals taken for this visit.There is no height or weight on file to calculate BMI.  General Appearance: Casual  Eye Contact:  Good  Speech:  Clear and Coherent and Normal Rate  Volume:  Normal  Mood:  Anxious and Depressed  Affect:  Congruent  Thought Process:  Goal Directed and Descriptions of Associations: Intact  Orientation:  Full (Time, Place, and Person)  Thought Content: WDL   Suicidal Thoughts:  No  Homicidal Thoughts:  No  Memory:   Immediate;   Good Recent;   Good Remote;   Good  Judgement:  Fair  Insight:  Fair  Psychomotor Activity:  Increased  Concentration:  Concentration: Good and Attention Span: Good  Recall:  Good  Fund of Knowledge: Good  Language: Good  Akathisia:  No  Handed:  Right  AIMS (if indicated):  AIMS:  Facial and Oral Movements  Muscles of Facial Expression: None, normal  Lips and Perioral Area: None, normal  Jaw: None, normal  Tongue: None, normal Extremity Movements: Upper (arms, wrists, hands,  fingers): None, normal  Lower (legs, knees, ankles, toes): None, normal,  Trunk Movements:  Neck, shoulders, hips: None, normal,  Overall Severity : Severity of abnormal movements (highest score from questions above): None, normal  Incapacitation due to abnormal movements: None, normal  Patient's awareness of abnormal movements (rate only patient's report): No Awareness, Dental Status  Current problems with teeth and/or dentures?: No  Does patient usually wear dentures?: No     Assets:  Communication Skills Desire for Improvement Housing Social Support Talents/Skills Transportation  ADL's:  Intact  Cognition: WNL  Sleep:  poor     Treatment Plan Summary:Medication management and Plan see below   Assessment: MDD-recurrent, moderate; GAD; Panic disorder without agoraphobia; Insomnia; Nicotine dependence   Medication management with supportive therapy. Risks/benefits and SE of the medication discussed. Pt verbalized understanding and verbal consent obtained for treatment.  Affirm with the patient that the medications are taken as ordered. Patient expressed understanding of how their medications were to be used.   The risk of un-intended pregnancy is low based on the fact that pt reports she is hysterectomy. Pt is aware that these meds carry a teratogenic risk. Pt will discuss plan of action if she does or plans to become pregnant in the future. Meds: increase Zyprexa to 10mg  po qD for  depression, anxiety and panic attacks Continue Wellbutrin XL 300mg  po qD for mood Start trial of Ativan 1mg  po qD prn anxiety as pt reports anxiety is overwhelming  Labs: none   Therapy: brief supportive therapy provided. Discussed psychosocial stressors in detail.   Encouraged pt to develop daily routine and work on daily goal setting as a way to improve mood symptoms.    Consultations: pt unable to afford therapy  Pt denies SI and is at an acute low risk for suicide. Patient told to call clinic if any problems occur. Patient advised to go to ER if they should develop SI/HI, side effects, or if symptoms worsen. Has crisis numbers to call if needed. Pt verbalized understanding.  F/up in 2 months or sooner if needed    Charlcie Cradle, MD 09/20/2016, 9:18 AM

## 2016-10-15 ENCOUNTER — Telehealth (HOSPITAL_COMMUNITY): Payer: Self-pay

## 2016-10-15 NOTE — Telephone Encounter (Signed)
Patient is calling to say that her anxiety has gotten worse, she has been unable to start the Zyprexa because she can not afford it. She was approved for patient assistance, but it could take 3-4 weeks for her to get the medication. Patient would like to know what to do in the meantime. Please review and advise, thank you

## 2016-10-16 ENCOUNTER — Other Ambulatory Visit (HOSPITAL_COMMUNITY): Payer: Self-pay | Admitting: Psychiatry

## 2016-10-18 NOTE — Telephone Encounter (Signed)
Patients medication came in, I called her to come and pick up at the front desk.

## 2016-11-29 ENCOUNTER — Ambulatory Visit (HOSPITAL_COMMUNITY): Payer: Self-pay | Admitting: Psychiatry

## 2017-07-11 ENCOUNTER — Other Ambulatory Visit (HOSPITAL_COMMUNITY): Payer: Self-pay | Admitting: Respiratory Therapy

## 2017-07-11 ENCOUNTER — Other Ambulatory Visit: Payer: Self-pay | Admitting: Gerontology

## 2017-07-11 ENCOUNTER — Other Ambulatory Visit (HOSPITAL_COMMUNITY): Payer: Self-pay | Admitting: Specialist

## 2017-07-11 DIAGNOSIS — M25561 Pain in right knee: Secondary | ICD-10-CM

## 2017-07-16 ENCOUNTER — Ambulatory Visit
Admission: RE | Admit: 2017-07-16 | Discharge: 2017-07-16 | Disposition: A | Discharge status: Home / Self Care | Payer: BLUE CROSS/BLUE SHIELD | Source: Ambulatory Visit | Attending: Gerontology | Admitting: Gerontology

## 2017-07-16 DIAGNOSIS — M25561 Pain in right knee: Secondary | ICD-10-CM

## 2017-07-17 ENCOUNTER — Ambulatory Visit (HOSPITAL_COMMUNITY): Payer: Self-pay

## 2017-07-20 ENCOUNTER — Ambulatory Visit (HOSPITAL_COMMUNITY): Payer: Self-pay

## 2017-09-25 ENCOUNTER — Other Ambulatory Visit: Payer: Self-pay

## 2017-09-25 ENCOUNTER — Emergency Department (HOSPITAL_COMMUNITY): Payer: BLUE CROSS/BLUE SHIELD

## 2017-09-25 ENCOUNTER — Emergency Department (HOSPITAL_COMMUNITY)
Admission: EM | Admit: 2017-09-25 | Discharge: 2017-09-26 | Disposition: A | Discharge status: Home / Self Care | Payer: BLUE CROSS/BLUE SHIELD | Attending: Emergency Medicine | Admitting: Emergency Medicine

## 2017-09-25 ENCOUNTER — Encounter (HOSPITAL_COMMUNITY): Payer: Self-pay | Admitting: Emergency Medicine

## 2017-09-25 DIAGNOSIS — F17210 Nicotine dependence, cigarettes, uncomplicated: Secondary | ICD-10-CM | POA: Diagnosis not present

## 2017-09-25 DIAGNOSIS — R112 Nausea with vomiting, unspecified: Secondary | ICD-10-CM | POA: Insufficient documentation

## 2017-09-25 DIAGNOSIS — R1013 Epigastric pain: Secondary | ICD-10-CM | POA: Diagnosis present

## 2017-09-25 DIAGNOSIS — Z79899 Other long term (current) drug therapy: Secondary | ICD-10-CM | POA: Diagnosis not present

## 2017-09-25 DIAGNOSIS — I10 Essential (primary) hypertension: Secondary | ICD-10-CM | POA: Diagnosis not present

## 2017-09-25 LAB — CBC WITH DIFFERENTIAL/PLATELET
BASOS PCT: 0 %
Basophils Absolute: 0 10*3/uL (ref 0.0–0.1)
EOS ABS: 0.2 10*3/uL (ref 0.0–0.7)
EOS PCT: 2 %
HCT: 41.4 % (ref 36.0–46.0)
HEMOGLOBIN: 13 g/dL (ref 12.0–15.0)
LYMPHS ABS: 2.2 10*3/uL (ref 0.7–4.0)
Lymphocytes Relative: 18 %
MCH: 26.8 pg (ref 26.0–34.0)
MCHC: 31.4 g/dL (ref 30.0–36.0)
MCV: 85.4 fL (ref 78.0–100.0)
MONOS PCT: 6 %
Monocytes Absolute: 0.7 10*3/uL (ref 0.1–1.0)
NEUTROS PCT: 74 %
Neutro Abs: 8.9 10*3/uL — ABNORMAL HIGH (ref 1.7–7.7)
PLATELETS: 376 10*3/uL (ref 150–400)
RBC: 4.85 MIL/uL (ref 3.87–5.11)
RDW: 14 % (ref 11.5–15.5)
WBC: 12 10*3/uL — ABNORMAL HIGH (ref 4.0–10.5)

## 2017-09-25 LAB — COMPREHENSIVE METABOLIC PANEL
ALBUMIN: 3.6 g/dL (ref 3.5–5.0)
ALT: 43 U/L (ref 14–54)
ANION GAP: 11 (ref 5–15)
AST: 41 U/L (ref 15–41)
Alkaline Phosphatase: 86 U/L (ref 38–126)
BUN: 8 mg/dL (ref 6–20)
CALCIUM: 8.9 mg/dL (ref 8.9–10.3)
CO2: 26 mmol/L (ref 22–32)
Chloride: 102 mmol/L (ref 101–111)
Creatinine, Ser: 0.69 mg/dL (ref 0.44–1.00)
GFR calc non Af Amer: >60 mL/min (ref >60)
GLUCOSE: 107 mg/dL — AB (ref 65–99)
Potassium: 3.5 mmol/L (ref 3.5–5.1)
SODIUM: 139 mmol/L (ref 135–145)
Total Bilirubin: 0.3 mg/dL (ref 0.3–1.2)
Total Protein: 7.3 g/dL (ref 6.5–8.1)

## 2017-09-25 LAB — LIPASE, BLOOD: Lipase: 25 U/L (ref 11–51)

## 2017-09-25 MED ORDER — IOPAMIDOL (ISOVUE-300) INJECTION 61%
100.0000 mL | Freq: Once | INTRAVENOUS | Status: AC | PRN
  Administered 2017-09-25: 100 mL via INTRAVENOUS

## 2017-09-25 MED ORDER — SODIUM CHLORIDE 0.9 % IV BOLUS
500.0000 mL | Freq: Once | INTRAVENOUS | Status: AC
  Administered 2017-09-25: 500 mL via INTRAVENOUS

## 2017-09-25 MED ORDER — FENTANYL CITRATE (PF) 100 MCG/2ML IJ SOLN
50.0000 ug | Freq: Once | INTRAMUSCULAR | Status: AC
  Administered 2017-09-25: 50 ug via INTRAVENOUS
  Filled 2017-09-25: qty 2

## 2017-09-25 MED ORDER — ONDANSETRON HCL 4 MG/2ML IJ SOLN
4.0000 mg | Freq: Once | INTRAMUSCULAR | Status: AC
  Administered 2017-09-25: 4 mg via INTRAVENOUS
  Filled 2017-09-25: qty 2

## 2017-09-25 MED ORDER — MORPHINE SULFATE (PF) 4 MG/ML IV SOLN
4.0000 mg | Freq: Once | INTRAVENOUS | Status: AC
  Administered 2017-09-25: 4 mg via INTRAVENOUS
  Filled 2017-09-25: qty 1

## 2017-09-25 MED ORDER — GI COCKTAIL ~~LOC~~
30.0000 mL | Freq: Once | ORAL | Status: AC
  Administered 2017-09-25: 30 mL via ORAL
  Filled 2017-09-25: qty 30

## 2017-09-25 NOTE — ED Triage Notes (Signed)
Pt hurting to right upper abd pain. With nausea and vomiting. Pt had ultrasound yesterday

## 2017-09-25 NOTE — ED Notes (Signed)
Patient transported to CT 

## 2017-09-26 ENCOUNTER — Telehealth: Payer: Self-pay | Admitting: Gastroenterology

## 2017-09-26 LAB — URINALYSIS, ROUTINE W REFLEX MICROSCOPIC
BILIRUBIN URINE: NEGATIVE
Glucose, UA: NEGATIVE mg/dL
Ketones, ur: NEGATIVE mg/dL
Leukocytes, UA: NEGATIVE
NITRITE: NEGATIVE
PROTEIN: NEGATIVE mg/dL
Specific Gravity, Urine: >1.046 — ABNORMAL HIGH (ref 1.005–1.030)
pH: 5 (ref 5.0–8.0)

## 2017-09-26 LAB — PREGNANCY, URINE: PREG TEST UR: NEGATIVE

## 2017-09-26 MED ORDER — ONDANSETRON 4 MG PO TBDP: 4.0000 mg | ORAL_TABLET | Freq: Three times a day (TID) | ORAL | 0 refills | Status: DC | PRN

## 2017-09-26 NOTE — ED Provider Notes (Signed)
[facility] EMERGENCY DEPARTMENT Provider Note   CSN: [id number] Arrival date & time: 09/25/17  1920     History   Chief Complaint Chief Complaint  Patient presents with  . Abdominal Pain    HPI Angela Massey is a 42 y.o. female.  HPI Patient presents with upper abdominal pain nausea and vomiting.  Pain is severe and gets worse after eating.  It is sharp.  No diarrhea.  Has not really had pains like this before.  Had her primary care doctor do a ultrasound yesterday but does not know the results.  No dysuria.  Has not taken anything for the pain.  Pain is severe. Past Medical History:  Diagnosis Date  . Anxiety   . Arthritis    back and ankles  . Depression   . Fatty liver   . Fibromyalgia   . Hypertension   . Hypertriglyceridemia   . Ovarian abscess   . PCOS (polycystic ovarian syndrome)     Patient Active Problem List   Diagnosis Date Noted  . MDD (major depressive disorder), recurrent episode, moderate ([place]) 10/11/2015  . GAD (generalized anxiety disorder) 10/11/2015  . Panic disorder without agoraphobia 10/11/2015  . Insomnia 10/11/2015  . Hyperprolactinemia ([place]) 05/26/2013  . Depression, major 05/26/2013    Past Surgical History:  Procedure Laterality Date  . ABDOMINAL SURGERY    . planter facitis    . TUBAL LIGATION       OB History    Gravida  4   Para  3   Term  3   Preterm      AB      Living  3     SAB      TAB      Ectopic      Multiple      Live Births               Home Medications    Prior to Admission medications   Medication Sig Start Date End Date Taking? Authorizing Provider  aspirin-acetaminophen-caffeine (EXCEDRIN MIGRAINE) [phone] MG tablet Take 1 tablet by mouth every 6 (six) hours as needed for headache (Takes 1-2 times daily).   Yes [provider]  clonazePAM (KLONOPIN) 1 MG tablet Take 0.5-1 mg by mouth 2 (two) times daily.   Yes [provider]  cyclobenzaprine (FLEXERIL) 10 MG  tablet Take 10 mg by mouth 3 (three) times daily as needed for muscle spasms. As needed 10/07/14  Yes [provider]  diclofenac sodium (VOLTAREN) 1 % GEL Apply topically every 4 (four) hours as needed (FOR PAIN).   Yes [provider]  FLUoxetine HCl 60 MG TABS Take 60 mg by mouth daily.   Yes [provider]  lamoTRIgine (LAMICTAL) 100 MG tablet Take 100 mg by mouth at bedtime.   Yes [provider]  naproxen sodium (ANAPROX) 220 MG tablet Take 220 mg by mouth daily as needed.    Yes [provider]  omeprazole (PRILOSEC) 40 MG capsule Take 40 mg by mouth every morning.   Yes [provider]  ondansetron (ZOFRAN) 8 MG tablet Take 8 mg by mouth 3 (three) times daily. 09/24/17  Yes [provider]  Oxycodone HCl 10 MG TABS Take 10 mg by mouth every 4 (four) hours as needed (FOR PAIN).   Yes [provider]    Family History Family History  Problem Relation Age of Onset  . Diabetes Maternal Grandmother   . Depression Mother   .  Anxiety disorder Mother   . Alcohol abuse Father     Social History Social History   Tobacco Use  . Smoking status: Current Every Day Smoker    Packs/day: 1.00    Years: 10.00    Pack years: 10.00    Types: Cigarettes  . Smokeless tobacco: Never Used  Substance Use Topics  . Alcohol use: Yes    Alcohol/week: 0.0 oz    Comment: 1-2 drinks per year  . Drug use: No     Allergies   Codeine; Phenergan [promethazine hcl]; and Seroquel [quetiapine fumarate]   Review of Systems Review of Systems  Constitutional: Negative for chills and fever.  HENT: Negative for dental problem.   Respiratory: Negative for cough.   Cardiovascular: Negative for chest pain.  Gastrointestinal: Positive for abdominal pain, nausea and vomiting.  Genitourinary: Negative for flank pain.  Musculoskeletal: Negative for back pain.  Neurological: Negative for weakness.  Psychiatric/Behavioral: Negative for  confusion.     Physical Exam Updated Vital Signs BP 101/61 (BP Location: Right Arm)   Pulse 75   Temp 98.3 F (36.8 C) (Oral)   Resp 16   Ht 5\' 2"  (1.575 m)   Wt 136.1 kg (300 lb)   LMP 09/22/2017   SpO2 94%   BMI 54.87 kg/m    Physical Exam  Constitutional: She appears well-developed and well-nourished.  HENT:  Head: Atraumatic.  Eyes: Pupils are equal, round, and reactive to light.  Cardiovascular: Normal rate.  Pulmonary/Chest: Breath sounds normal.  Abdominal: Normal appearance. No hernia.  Epigastric to right upper quadrant tenderness.  No hernia palpated.  No rebound or guarding.  Neurological: She is alert.  Skin: Skin is warm. Capillary refill takes less than 2 seconds.     ED Treatments / Results  Labs (all labs ordered are listed, but only abnormal results are displayed) Labs Reviewed  CBC WITH DIFFERENTIAL/PLATELET - Abnormal; Notable for the following components:      Result Value   WBC 12.0 (*)    Neutro Abs 8.9 (*)    All other components within normal limits  COMPREHENSIVE METABOLIC PANEL - Abnormal; Notable for the following components:   Glucose, Bld 107 (*)    All other components within normal limits  LIPASE, BLOOD  URINALYSIS, ROUTINE W REFLEX MICROSCOPIC  PREGNANCY, URINE    EKG None  Radiology Ct Abdomen Pelvis W Contrast  Result Date: 09/25/2017 CLINICAL DATA:  42 year old female with right upper abdominal pain nausea and vomiting. EXAM: CT ABDOMEN AND PELVIS WITH CONTRAST TECHNIQUE: Multidetector CT imaging of the abdomen and pelvis was performed using the standard protocol following bolus administration of intravenous contrast. CONTRAST:  18mL ISOVUE-300 IOPAMIDOL (ISOVUE-300) INJECTION 61% COMPARISON:  Lumbar MRI 03/03/2016. CT Abdomen and Pelvis 09/19/2015. FINDINGS: Lower chest: Lung bases are stable since 2017 and negative. No pericardial or pleural effusion. Hepatobiliary: Chronic [place] steatosis. Liver enhancement is within  normal limits. The gallbladder is within normal limits. There is no biliary ductal enlargement. Pancreas: Negative. Spleen: Negative. Adrenals/Urinary Tract: Normal adrenal glands. Bilateral renal enhancement and contrast excretion is symmetric and within normal limits. There is a small benign appearing left renal midpole cyst. No perinephric stranding. No hydronephrosis or hydroureter. Negative course of both ureters. Small chronic pelvic phleboliths. Diminutive and unremarkable urinary bladder. No urologic calculus identified. Stomach/Bowel: Negative rectum. Mildly redundant sigmoid colon with some retained stool. Similar retained stool in the left colon. Negative transverse colon and right colon. Normal appendix (series 2, image 64). Negative terminal  ileum. No dilated or abnormal small bowel loops. Decompressed stomach and duodenum. No abdominal free air or free fluid. Vascular/Lymphatic: The major arterial structures in the abdomen and pelvis appear patent and normal. The portal venous system appears patent. No lymphadenopathy. Reproductive: Negative. Other: No pelvic free fluid. Musculoskeletal: Lower lumbar facet degeneration. No acute osseous abnormality identified. IMPRESSION: 1. No acute or inflammatory process identified in the abdomen or pelvis. Normal appendix. 2. Chronic hepatic steatosis. Electronically Signed   By: Genevie Ann M.D.   On: 09/25/2017 22:40    Procedures Procedures (including critical care time)  Medications Ordered in ED Medications  sodium chloride 0.9 % bolus 500 mL (0 mLs Intravenous Stopped 09/25/17 2123)  ondansetron (ZOFRAN) injection 4 mg (4 mg Intravenous Given 09/25/17 2024)  fentaNYL (SUBLIMAZE) injection 50 mcg (50 mcg Intravenous Given 09/25/17 2025)  morphine 4 MG/ML injection 4 mg (4 mg Intravenous Given 09/25/17 2122)  iopamidol (ISOVUE-300) 61 % injection 100 mL (100 mLs Intravenous Contrast Given 09/25/17 2218)  gi cocktail (Maalox,Lidocaine,Donnatal) (30 mLs Oral  Given 09/25/17 2345)     Initial Impression / Assessment and Plan / ED Course  I have reviewed the triage vital signs and the nursing notes.  Pertinent labs & imaging results that were available during my care of the patient were reviewed by me and considered in my medical decision making (see chart for details).     Patient upper abdominal pain.  Labs reassuring but mildly elevated white count.  CT scan done without clear cause.  GI cocktail given.Urinalysis pending but likely will be able to discharge home.  Follow-up with gastroenterology.  Care will be turned over to Dr. Tomi Bamberger  Final Clinical Impressions(s) / ED Diagnoses   Final diagnoses:  Epigastric pain    ED Discharge Orders    None      Davonna Belling, MD 09/26/17 0040

## 2017-09-26 NOTE — Telephone Encounter (Signed)
Pt was seen in the ER last night and was recommended to follow up with [place]. Can we accept her as a new patient?

## 2017-09-27 NOTE — Telephone Encounter (Signed)
yes

## 2017-10-03 ENCOUNTER — Encounter: Payer: Self-pay | Admitting: Gastroenterology

## 2017-10-03 NOTE — Telephone Encounter (Signed)
OV made and letter mailed °

## 2017-12-16 ENCOUNTER — Emergency Department (HOSPITAL_COMMUNITY): Payer: BLUE CROSS/BLUE SHIELD

## 2017-12-16 ENCOUNTER — Encounter (HOSPITAL_COMMUNITY): Payer: Self-pay | Admitting: Emergency Medicine

## 2017-12-16 ENCOUNTER — Emergency Department (HOSPITAL_COMMUNITY)
Admission: EM | Admit: 2017-12-16 | Discharge: 2017-12-16 | Disposition: A | Discharge status: Home / Self Care | Payer: BLUE CROSS/BLUE SHIELD | Attending: Emergency Medicine | Admitting: Emergency Medicine

## 2017-12-16 DIAGNOSIS — R300 Dysuria: Secondary | ICD-10-CM | POA: Diagnosis present

## 2017-12-16 DIAGNOSIS — Z79899 Other long term (current) drug therapy: Secondary | ICD-10-CM | POA: Diagnosis not present

## 2017-12-16 DIAGNOSIS — I10 Essential (primary) hypertension: Secondary | ICD-10-CM | POA: Diagnosis not present

## 2017-12-16 DIAGNOSIS — Z87891 Personal history of nicotine dependence: Secondary | ICD-10-CM | POA: Diagnosis not present

## 2017-12-16 DIAGNOSIS — N3001 Acute cystitis with hematuria: Secondary | ICD-10-CM | POA: Diagnosis not present

## 2017-12-16 DIAGNOSIS — J069 Acute upper respiratory infection, unspecified: Secondary | ICD-10-CM | POA: Diagnosis not present

## 2017-12-16 HISTORY — DX: Sciatica, unspecified side: M54.30

## 2017-12-16 LAB — URINALYSIS, ROUTINE W REFLEX MICROSCOPIC
Bilirubin Urine: NEGATIVE
Glucose, UA: NEGATIVE mg/dL
KETONES UR: NEGATIVE mg/dL
Nitrite: NEGATIVE
PROTEIN: 30 mg/dL — AB
RBC / HPF: >50 RBC/hpf — ABNORMAL HIGH (ref 0–5)
SPECIFIC GRAVITY, URINE: 1.02 (ref 1.005–1.030)
WBC, UA: >50 WBC/hpf — ABNORMAL HIGH (ref 0–5)
pH: 5 (ref 5.0–8.0)

## 2017-12-16 LAB — PREGNANCY, URINE: PREG TEST UR: NEGATIVE

## 2017-12-16 MED ORDER — AEROCHAMBER PLUS FLO-VU MEDIUM MISC
1.0000 | Freq: Once | Status: AC
  Administered 2017-12-16: 1
  Filled 2017-12-16: qty 1

## 2017-12-16 MED ORDER — SULFAMETHOXAZOLE-TRIMETHOPRIM 800-160 MG PO TABS: 1.0000 | ORAL_TABLET | Freq: Two times a day (BID) | ORAL | 0 refills | Status: AC

## 2017-12-16 MED ORDER — OXYMETAZOLINE HCL 0.05 % NA SOLN
1.0000 | Freq: Once | NASAL | Status: AC
  Administered 2017-12-16: 1 via NASAL
  Filled 2017-12-16: qty 15

## 2017-12-16 MED ORDER — ALBUTEROL SULFATE HFA 108 (90 BASE) MCG/ACT IN AERS
1.0000 | INHALATION_SPRAY | RESPIRATORY_TRACT | Status: DC | PRN
  Administered 2017-12-16: 2 via RESPIRATORY_TRACT
  Filled 2017-12-16: qty 6.7

## 2017-12-16 MED ORDER — IPRATROPIUM-ALBUTEROL 0.5-2.5 (3) MG/3ML IN SOLN
3.0000 mL | Freq: Once | RESPIRATORY_TRACT | Status: AC
  Administered 2017-12-16: 3 mL via RESPIRATORY_TRACT
  Filled 2017-12-16: qty 3

## 2017-12-16 MED ORDER — KETOROLAC TROMETHAMINE 30 MG/ML IJ SOLN
30.0000 mg | Freq: Once | INTRAMUSCULAR | Status: AC
  Administered 2017-12-16: 30 mg via INTRAMUSCULAR
  Filled 2017-12-16: qty 1

## 2017-12-16 NOTE — Discharge Instructions (Signed)
Otc Sudafed for sinus congestion.

## 2017-12-16 NOTE — ED Triage Notes (Signed)
Pt has a plethora of complaints.  States she began having dysuria 3 days ago with low mid abd pain.  Also cough, congestion, bilateral ear pain, and dizziness x 4 days.  States she is very sob with exertion.

## 2017-12-16 NOTE — ED Provider Notes (Signed)
[facility] EMERGENCY DEPARTMENT Provider Note   CSN: [id number] Arrival date & time: 12/16/17  1102     History   Chief Complaint Chief Complaint  Patient presents with  . Dysuria    HPI Angela Massey is a 42 y.o. female.  Pt presents to the ED today with dysuria which started a few days ago.  She also has sinus congestion and sob.  The pt said she could not sleep last night due to sob.  She has a hx of smoking, but has not smoked in 6 months.  She denies fevers, but has felt chills.  The pt      Past Medical History:  Diagnosis Date  . Anxiety   . Arthritis    back and ankles  . Depression   . Fatty liver   . Fibromyalgia   . Hypertension   . Hypertriglyceridemia   . Ovarian abscess   . PCOS (polycystic ovarian syndrome)   . Sciatica     Patient Active Problem List   Diagnosis Date Noted  . MDD (major depressive disorder), recurrent episode, moderate ([place]) 10/11/2015  . GAD (generalized anxiety disorder) 10/11/2015  . Panic disorder without agoraphobia 10/11/2015  . Insomnia 10/11/2015  . Hyperprolactinemia ([place]) 05/26/2013  . Depression, major 05/26/2013    Past Surgical History:  Procedure Laterality Date  . ABDOMINAL SURGERY    . planter facitis    . TUBAL LIGATION       OB History    Gravida  4   Para  3   Term  3   Preterm      AB      Living  3     SAB      TAB      Ectopic      Multiple      Live Births               Home Medications    Prior to Admission medications   Medication Sig Start Date End Date Taking? Authorizing Provider  aspirin-acetaminophen-caffeine (EXCEDRIN MIGRAINE) [phone] MG tablet Take 1 tablet by mouth every 6 (six) hours as needed for headache (Takes 1-2 times daily).   Yes [provider]  clonazePAM (KLONOPIN) 1 MG tablet Take 0.5-1 mg by mouth 2 (two) times daily.   Yes [provider]  cyclobenzaprine (FLEXERIL) 10 MG tablet Take 10 mg by mouth 3 (three) times daily as  needed for muscle spasms. As needed 10/07/14  Yes [provider]  diclofenac sodium (VOLTAREN) 1 % GEL Apply topically every 4 (four) hours as needed (FOR PAIN).   Yes [provider]  FLUoxetine HCl 60 MG TABS Take 60 mg by mouth daily.   Yes [provider]  lamoTRIgine (LAMICTAL) 100 MG tablet Take 100 mg by mouth at bedtime.   Yes [provider]  naproxen sodium (ANAPROX) 220 MG tablet Take 220 mg by mouth daily as needed.    Yes [provider]  omeprazole (PRILOSEC) 40 MG capsule Take 40 mg by mouth every morning.   Yes [provider]  Oxycodone HCl 10 MG TABS Take 10 mg by mouth every 4 (four) hours as needed (FOR PAIN).   Yes [provider]  ondansetron (ZOFRAN-ODT) 4 MG disintegrating tablet Take 1 tablet (4 mg total) by mouth every 8 (eight) hours as needed for nausea or vomiting. Patient not taking: Reported on 12/16/2017 09/26/17   Davonna Belling, MD  sulfamethoxazole-trimethoprim (BACTRIM DS,SEPTRA DS) 800-160  MG tablet Take 1 tablet by mouth 2 (two) times daily for 7 days. 12/16/17 12/23/17  Isla Pence, MD    Family History Family History  Problem Relation Age of Onset  . Diabetes Maternal Grandmother   . Depression Mother   . Anxiety disorder Mother   . Alcohol abuse Father     Social History Social History   Tobacco Use  . Smoking status: Former Smoker    Packs/day: 1.00    Years: 10.00    Pack years: 10.00    Types: Cigarettes    Last attempt to quit: 04/17/2017    Years since quitting: 0.6  . Smokeless tobacco: Never Used  Substance Use Topics  . Alcohol use: Yes    Alcohol/week: 0.0 oz    Comment: 1-2 drinks per year  . Drug use: No     Allergies   Codeine; Phenergan [promethazine hcl]; and Seroquel [quetiapine fumarate]   Review of Systems Review of Systems  HENT: Positive for sinus pressure.   Respiratory: Positive for cough and shortness of breath.   Genitourinary: Positive for  dysuria.  All other systems reviewed and are negative.    Physical Exam Updated Vital Signs BP (!) 137/92 (BP Location: Right Wrist)   Pulse 87   Temp 98.8 F (37.1 C) (Oral)   Resp 18   Ht 5\' 2"  (1.575 m)   Wt (!) 140.6 kg (310 lb)   LMP 11/18/2017 (Exact Date)   SpO2 94%   BMI 56.70 kg/m   Physical Exam  Constitutional: She is oriented to person, place, and time. She appears well-developed and well-nourished.  HENT:  Head: Normocephalic and atraumatic.  Right Ear: External ear normal.  Left Ear: External ear normal.  Nose: Rhinorrhea present.  Mouth/Throat: Oropharynx is clear and moist.  Eyes: Pupils are equal, round, and reactive to light. Conjunctivae and EOM are normal.  Neck: Normal range of motion. Neck supple.  Cardiovascular: Normal rate, regular rhythm, normal heart sounds and intact distal pulses.  Pulmonary/Chest: Effort normal. She has wheezes.  Abdominal: Soft. Bowel sounds are normal.  Musculoskeletal: Normal range of motion.  Neurological: She is alert and oriented to person, place, and time.  Skin: Skin is warm. Capillary refill takes less than 2 seconds.  Psychiatric: She has a normal mood and affect. Her behavior is normal. Judgment and thought content normal.  Nursing note and vitals reviewed.    ED Treatments / Results  Labs (all labs ordered are listed, but only abnormal results are displayed) Labs Reviewed  URINALYSIS, ROUTINE W REFLEX MICROSCOPIC - Abnormal; Notable for the following components:      Result Value   APPearance CLOUDY (*)    Hgb urine dipstick MODERATE (*)    Protein, ur 30 (*)    Leukocytes, UA LARGE (*)    RBC / HPF >50 (*)    WBC, UA >50 (*)    Bacteria, UA RARE (*)    Non Squamous Epithelial 0-5 (*)    All other components within normal limits  PREGNANCY, URINE    EKG EKG Interpretation  Date/Time:  Monday December 16 2017 11:32:13 EDT Ventricular Rate:  90 PR Interval:    QRS Duration: 91 QT Interval:  367 QTC  Calculation: 449 R Axis:   43 Text Interpretation:  Sinus rhythm Low voltage, precordial leads No significant change since last tracing Confirmed by Isla Pence [phone]) on 12/16/2017 11:40:33 AM   Radiology Dg Chest 2 View  Result Date: 12/16/2017 CLINICAL DATA:  Shortness of breath for 1 day EXAM: CHEST - 2 VIEW COMPARISON:  03/09/2017 FINDINGS: Cardiac shadow is within normal limits. The lungs are well aerated bilaterally. No focal infiltrate or sizable effusion is seen. Mild degenerative changes of the thoracic spine are again seen and stable. IMPRESSION: No active cardiopulmonary disease. Electronically Signed   By: Inez Catalina M.D.   On: 12/16/2017 12:46    Procedures Procedures (including critical care time)  Medications Ordered in ED Medications  albuterol (PROVENTIL HFA;VENTOLIN HFA) 108 (90 Base) MCG/ACT inhaler 1-2 puff (has no administration in time range)  AEROCHAMBER PLUS FLO-VU MEDIUM MISC 1 each (has no administration in time range)  ipratropium-albuterol (DUONEB) 0.5-2.5 (3) MG/3ML nebulizer solution 3 mL (3 mLs Nebulization Given 12/16/17 1237)  oxymetazoline (AFRIN) 0.05 % nasal spray 1 spray (1 spray Each Nare Given 12/16/17 1244)  ketorolac (TORADOL) 30 MG/ML injection 30 mg (30 mg Intramuscular Given 12/16/17 1244)     Initial Impression / Assessment and Plan / ED Course  I have reviewed the triage vital signs and the nursing notes.  Pertinent labs & imaging results that were available during my care of the patient were reviewed by me and considered in my medical decision making (see chart for details).    Breathing has improved after neb.  She is given an albuterol inhaler with spacer prior to d/c.  She will be treated with bactrim for her UTI.  She knows to return if worse and to f/u with pcp.   Final Clinical Impressions(s) / ED Diagnoses   Final diagnoses:  Acute cystitis with hematuria  Viral upper respiratory tract infection    ED Discharge Orders          Ordered    sulfamethoxazole-trimethoprim (BACTRIM DS,SEPTRA DS) 800-160 MG tablet  2 times daily     12/16/17 1313       Isla Pence, MD 12/16/17 1321

## 2017-12-20 ENCOUNTER — Ambulatory Visit: Payer: BLUE CROSS/BLUE SHIELD | Admitting: Gastroenterology

## 2018-03-22 ENCOUNTER — Other Ambulatory Visit: Payer: Self-pay

## 2018-03-22 ENCOUNTER — Encounter (HOSPITAL_COMMUNITY): Payer: Self-pay | Admitting: Emergency Medicine

## 2018-03-22 ENCOUNTER — Emergency Department (HOSPITAL_COMMUNITY)
Admission: EM | Admit: 2018-03-22 | Discharge: 2018-03-22 | Disposition: A | Discharge status: Home / Self Care | Payer: BLUE CROSS/BLUE SHIELD | Attending: Emergency Medicine | Admitting: Emergency Medicine

## 2018-03-22 DIAGNOSIS — R3915 Urgency of urination: Secondary | ICD-10-CM | POA: Insufficient documentation

## 2018-03-22 DIAGNOSIS — R350 Frequency of micturition: Secondary | ICD-10-CM | POA: Insufficient documentation

## 2018-03-22 DIAGNOSIS — M545 Low back pain, unspecified: Secondary | ICD-10-CM

## 2018-03-22 DIAGNOSIS — Z87891 Personal history of nicotine dependence: Secondary | ICD-10-CM | POA: Insufficient documentation

## 2018-03-22 DIAGNOSIS — I10 Essential (primary) hypertension: Secondary | ICD-10-CM | POA: Insufficient documentation

## 2018-03-22 DIAGNOSIS — Z79899 Other long term (current) drug therapy: Secondary | ICD-10-CM | POA: Insufficient documentation

## 2018-03-22 LAB — URINALYSIS, ROUTINE W REFLEX MICROSCOPIC
BILIRUBIN URINE: NEGATIVE
GLUCOSE, UA: NEGATIVE mg/dL
HGB URINE DIPSTICK: NEGATIVE
Ketones, ur: NEGATIVE mg/dL
Leukocytes, UA: NEGATIVE
NITRITE: NEGATIVE
PH: 5 (ref 5.0–8.0)
Protein, ur: NEGATIVE mg/dL
SPECIFIC GRAVITY, URINE: 1.023 (ref 1.005–1.030)

## 2018-03-22 MED ORDER — DEXAMETHASONE SODIUM PHOSPHATE 10 MG/ML IJ SOLN
10.0000 mg | Freq: Once | INTRAMUSCULAR | Status: AC
  Administered 2018-03-22: 10 mg via INTRAMUSCULAR
  Filled 2018-03-22: qty 1

## 2018-03-22 MED ORDER — METHOCARBAMOL 500 MG PO TABS: 500.0000 mg | ORAL_TABLET | Freq: Two times a day (BID) | ORAL | 0 refills | Status: DC | PRN

## 2018-03-22 NOTE — Discharge Instructions (Signed)
Your testing today shows no signs of urinary infection.  Your back pain is likely related to muscle spasm however if you should develop increasing pain, numbness, weakness or any severe or worsening symptoms please return to the emergency department immediately.   I have given you a prescription for Robaxin to be used in place of Flexeril.  Please do not use those 2 medications at the same time.  Please call your doctor in the morning to let them know about this medication change and to arrange follow-up.  I have also referred you to a spinal specialist, Dr. Vertell Limber, please see the information above, you may make a phone call to arrange a follow-up appointment for the next available appointment.

## 2018-03-22 NOTE — ED Triage Notes (Signed)
Pt reports history of chronic back pain but reports "this pain is different and higher up on both sides." pt denies gi/gu symptoms. Pt reports has tried flexeril with no relief. Pt reports bilateral below the knee numbness since pain onset a week ago.

## 2018-03-22 NOTE — ED Provider Notes (Signed)
[facility] EMERGENCY DEPARTMENT Provider Note   CSN: [id number] Arrival date & time: 03/22/18  1004     History   Chief Complaint Chief Complaint  Patient presents with  . Back Pain    HPI Angela Massey is a 42 y.o. female.  HPI  42 year old female, she has multiple medical problems including history of chronic back pain for which she has been seen by orthopedist, she is currently being taking care of the pain clinic in [place] where she gets oxycodone, 10 mg by mouth every 4 hours which she uses regularly because of her chronic pain which often wakes her up at night.  She was treated as well with 2 different nerve ablations, one which worked in one which did not and is known to have some herniated disks in her lumbar spine.  That being said over the last month she has had some increased activity trying to exercise and lose weight, and she has been doing that she notes that eventually as she walks she develops acute onset of muscle spasms and cramping in her lower back, this is different than her chronic pain seems to be higher, last for 5 to 20 minutes and then resolves.  It seems to get worse with any change in position such as movement from a sitting to a standing position and is sometimes associated with numbness and tingling in her bilateral feet.  She reports that when the pain hits the numbness and tingling hits as well and when the pain goes away the symptoms also go away.  She denies any history of cancer, IV drug use, the pain definitely goes away when she rests.  She has been using Flexeril at night in addition to the Naprosyn twice a day but is still having her chronic pain.  Lastly the patient does endorse having some urinary frequency and urgency recently.  Past Medical History:  Diagnosis Date  . Anxiety   . Arthritis    back and ankles  . Depression   . Fatty liver   . Fibromyalgia   . Hypertension   . Hypertriglyceridemia   . Ovarian abscess   . PCOS  (polycystic ovarian syndrome)   . Sciatica     Patient Active Problem List   Diagnosis Date Noted  . MDD (major depressive disorder), recurrent episode, moderate ([place]) 10/11/2015  . GAD (generalized anxiety disorder) 10/11/2015  . Panic disorder without agoraphobia 10/11/2015  . Insomnia 10/11/2015  . Hyperprolactinemia ([place]) 05/26/2013  . Depression, major 05/26/2013    Past Surgical History:  Procedure Laterality Date  . ABDOMINAL SURGERY    . planter facitis    . TUBAL LIGATION       OB History    Gravida  4   Para  3   Term  3   Preterm      AB      Living  3     SAB      TAB      Ectopic      Multiple      Live Births               Home Medications    Prior to Admission medications   Medication Sig Start Date End Date Taking? Authorizing Provider  aspirin-acetaminophen-caffeine (EXCEDRIN MIGRAINE) [phone] MG tablet Take 1 tablet by mouth every 6 (six) hours as needed for headache (Takes 1-2 times daily).    [provider]  clonazePAM (KLONOPIN) 1 MG tablet Take 0.5-1 mg  by mouth 2 (two) times daily.    [provider]  cyclobenzaprine (FLEXERIL) 10 MG tablet Take 10 mg by mouth 3 (three) times daily as needed for muscle spasms. As needed 10/07/14   [provider]  diclofenac sodium (VOLTAREN) 1 % GEL Apply topically every 4 (four) hours as needed (FOR PAIN).    [provider]  FLUoxetine HCl 60 MG TABS Take 60 mg by mouth daily.    [provider]  lamoTRIgine (LAMICTAL) 100 MG tablet Take 100 mg by mouth at bedtime.    [provider]  methocarbamol (ROBAXIN) 500 MG tablet Take 1 tablet (500 mg total) by mouth 2 (two) times daily as needed for muscle spasms. 03/22/18   Noemi Chapel, MD  naproxen sodium (ANAPROX) 220 MG tablet Take 220 mg by mouth daily as needed.     [provider]  omeprazole (PRILOSEC) 40 MG capsule Take 40 mg by mouth every morning.    [provider]    ondansetron (ZOFRAN-ODT) 4 MG disintegrating tablet Take 1 tablet (4 mg total) by mouth every 8 (eight) hours as needed for nausea or vomiting. Patient not taking: Reported on 12/16/2017 09/26/17   Davonna Belling, MD  Oxycodone HCl 10 MG TABS Take 10 mg by mouth every 4 (four) hours as needed (FOR PAIN).    [provider]    Family History Family History  Problem Relation Age of Onset  . Diabetes Maternal Grandmother   . Depression Mother   . Anxiety disorder Mother   . Alcohol abuse Father     Social History Social History   Tobacco Use  . Smoking status: Former Smoker    Packs/day: 1.00    Years: 10.00    Pack years: 10.00    Types: Cigarettes    Last attempt to quit: 04/17/2017    Years since quitting: 0.9  . Smokeless tobacco: Never Used  Substance Use Topics  . Alcohol use: Yes    Alcohol/week: 0.0 standard drinks    Comment: 1-2 drinks per year  . Drug use: No     Allergies   Codeine; Phenergan [promethazine hcl]; and Seroquel [quetiapine fumarate]   Review of Systems Review of Systems  Constitutional: Negative for chills, fever and unexpected weight change.  Eyes: Negative for redness.  Respiratory: Negative for cough and shortness of breath.   Cardiovascular: Negative for leg swelling.  Gastrointestinal: Negative for nausea and vomiting.       No incontinence of bowel  Genitourinary: Positive for frequency and urgency. Negative for difficulty urinating.       No incontinence or retention  Musculoskeletal: Positive for back pain. Negative for neck pain.  Skin: Negative for rash.  Neurological: Positive for numbness ( Intermittent). Negative for weakness.  Hematological: Negative for adenopathy.     Physical Exam Updated Vital Signs BP 127/83 (BP Location: Left Arm)   Pulse (!) 102   Temp 98.3 F (36.8 C) (Oral)   Resp 16   Ht 1.575 m (5\' 2" )   Wt 136.1 kg   SpO2 95%   BMI 54.87 kg/m   Physical Exam  Constitutional: She appears  well-developed and well-nourished. No distress.  HENT:  Head: Normocephalic and atraumatic.  Mouth/Throat: Oropharynx is clear and moist. No oropharyngeal exudate.  Eyes: Pupils are equal, round, and reactive to light. Conjunctivae and EOM are normal. Right eye exhibits no discharge. Left eye exhibits no discharge. No scleral icterus.  Neck: Normal range of motion. Neck supple.  No JVD present. No thyromegaly present.  Cardiovascular: Normal rate, regular rhythm, normal heart sounds and intact distal pulses. Exam reveals no gallop and no friction rub.  No murmur heard. Pulmonary/Chest: Effort normal and breath sounds normal. No respiratory distress. She has no wheezes. She has no rales.  Abdominal: Soft. Bowel sounds are normal. She exhibits no distension and no mass. There is no tenderness.  Musculoskeletal: Normal range of motion. She exhibits tenderness ( There is tenderness to palpation across the lower back in the paraspinal muscles, minimal spinal tenderness, no tenderness in the thoracic or cervical areas). She exhibits no edema.  The patient is able to straight leg raise bilaterally, this causes back pain  Lymphadenopathy:    She has no cervical adenopathy.  Neurological: She is alert. Coordination normal.  Strength is 5 out of 5 in all 4 extremities, she is able to flex and extend at the knees and the ankles, she is able to raise the great toe on the air bilaterally, she has no sensory deficits of her bilateral lower extremities except for a very small patch on the medial right knee, there is no distal numbness or weakness.  Skin: Skin is warm and dry. No rash noted. No erythema.  Psychiatric: She has a normal mood and affect. Her behavior is normal.  Nursing note and vitals reviewed.    ED Treatments / Results  Labs (all labs ordered are listed, but only abnormal results are displayed) Labs Reviewed  URINALYSIS, ROUTINE W REFLEX MICROSCOPIC - Abnormal; Notable for the following  components:      Result Value   APPearance CLOUDY (*)    All other components within normal limits  URINE CULTURE    EKG None  Radiology No results found.  Procedures Procedures (including critical care time)  Medications Ordered in ED Medications  dexamethasone (DECADRON) injection 10 mg (10 mg Intramuscular Given 03/22/18 1035)     Initial Impression / Assessment and Plan / ED Course  I have reviewed the triage vital signs and the nursing notes.  Pertinent labs & imaging results that were available during my care of the patient were reviewed by me and considered in my medical decision making (see chart for details).  Clinical Course as of Mar 22 1217  Sat Mar 22, 2018  1206 The urinalysis is negative for infection, the patient is stable for discharge   [BM]    Clinical Course User Index [BM] Noemi Chapel, MD   The patient's presentation is more consistent with muscular spasm and given her increase in activity this month that seems to be associated with some muscles that are being recruited that have been somewhat deconditioned.  Her neurologic exam is unremarkable, she has fairly severe chronic pain for which she is on quite large doses of narcotic medications.  I think it would be reasonable to try a steroid, change the muscle relaxer and have a formal referral to neurosurgery.  The patient is aware that she needs to lose weight, she is attempting to do that with increasing in exercise.  We will also check a urinalysis.  Urinalysis negative, patient medically stable, no neurologic compromise, referred to local neurosurgery, patient is breast understanding  Final Clinical Impressions(s) / ED Diagnoses   Final diagnoses:  Acute bilateral low back pain without sciatica    ED Discharge Orders         Ordered    methocarbamol (ROBAXIN) 500 MG tablet  2 times daily PRN     03/22/18  [place], MD 03/22/18 1218

## 2018-03-23 LAB — URINE CULTURE: Culture: 30000 — AB

## 2018-03-24 ENCOUNTER — Telehealth: Payer: Self-pay | Admitting: Emergency Medicine

## 2018-03-24 NOTE — Telephone Encounter (Signed)
Post ED Visit - Positive Culture Follow-up  Culture report reviewed by antimicrobial stewardship pharmacist:  []  Elenor Quinones, Pharm.D. []  Heide Guile, Pharm.D., BCPS AQ-ID []  Parks Neptune, Pharm.D., BCPS []  Alycia Rossetti, Pharm.D., BCPS []  [place], Pharm.D., BCPS, AAHIVP []  Legrand Como, Pharm.D., BCPS, AAHIVP []  Salome Arnt, PharmD, BCPS []  Johnnette Gourd, PharmD, BCPS [x]  Hughes Better, PharmD, BCPS []  Leeroy Cha, PharmD  Positive urine culture Treated with none, asymptomatic, organism sensitive to the same and no further patient follow-up is required at this time.  Hazle Nordmann 03/24/2018, 3:39 PM

## 2018-04-30 ENCOUNTER — Encounter (HOSPITAL_COMMUNITY): Payer: Self-pay | Admitting: *Deleted

## 2018-04-30 ENCOUNTER — Emergency Department (HOSPITAL_COMMUNITY)
Admission: EM | Admit: 2018-04-30 | Discharge: 2018-04-30 | Disposition: A | Discharge status: Home / Self Care | Payer: Self-pay | Attending: Emergency Medicine | Admitting: Emergency Medicine

## 2018-04-30 ENCOUNTER — Other Ambulatory Visit: Payer: Self-pay

## 2018-04-30 DIAGNOSIS — Z79899 Other long term (current) drug therapy: Secondary | ICD-10-CM | POA: Insufficient documentation

## 2018-04-30 DIAGNOSIS — J0110 Acute frontal sinusitis, unspecified: Secondary | ICD-10-CM | POA: Insufficient documentation

## 2018-04-30 DIAGNOSIS — I10 Essential (primary) hypertension: Secondary | ICD-10-CM | POA: Insufficient documentation

## 2018-04-30 DIAGNOSIS — F17210 Nicotine dependence, cigarettes, uncomplicated: Secondary | ICD-10-CM | POA: Insufficient documentation

## 2018-04-30 MED ORDER — AMOXICILLIN 500 MG PO CAPS: 500.0000 mg | ORAL_CAPSULE | Freq: Three times a day (TID) | ORAL | 0 refills | Status: DC

## 2018-04-30 NOTE — Discharge Instructions (Addendum)
Follow-up with your doctor for recheck in 1 to 2 weeks drink plenty of fluids

## 2018-04-30 NOTE — ED Provider Notes (Signed)
[facility] EMERGENCY DEPARTMENT Provider Note   CSN: [id number] Arrival date & time: 04/30/18  1093     History   Chief Complaint Chief Complaint  Patient presents with  . Cough    HPI Angela Massey is a 42 y.o. female.  Patient complains of cough and sinus congestion.  Patient states that she gets sinusitis every year and this is been going on for 2 to 3 weeks  The history is provided by the patient. No language interpreter was used.  Cough  This is a new problem. The current episode started more than 1 week ago. The cough is non-productive. There has been no fever. Pertinent negatives include no chest pain and no headaches. She has tried nothing for the symptoms. The treatment provided no relief. Risk factors: Chronic sinusitis problem. She is not a smoker. Her past medical history does not include pneumonia.    Past Medical History:  Diagnosis Date  . Anxiety   . Arthritis    back and ankles  . Depression   . Fatty liver   . Fibromyalgia   . Hypertension   . Hypertriglyceridemia   . Ovarian abscess   . PCOS (polycystic ovarian syndrome)   . Sciatica     Patient Active Problem List   Diagnosis Date Noted  . MDD (major depressive disorder), recurrent episode, moderate ([place]) 10/11/2015  . GAD (generalized anxiety disorder) 10/11/2015  . Panic disorder without agoraphobia 10/11/2015  . Insomnia 10/11/2015  . Hyperprolactinemia ([place]) 05/26/2013  . Depression, major 05/26/2013    Past Surgical History:  Procedure Laterality Date  . Diagnostic Laparotomy    . planter facitis    . TUBAL LIGATION       OB History    Gravida  4   Para  3   Term  3   Preterm      AB      Living  3     SAB      TAB      Ectopic      Multiple      Live Births               Home Medications    Prior to Admission medications   Medication Sig Start Date End Date Taking? Authorizing Provider  amoxicillin (AMOXIL) 500 MG capsule Take 1 capsule (500 mg  total) by mouth 3 (three) times daily. 04/30/18   Milton Ferguson, MD  aspirin-acetaminophen-caffeine (EXCEDRIN MIGRAINE) [phone] MG tablet Take 1 tablet by mouth every 6 (six) hours as needed for headache (Takes 1-2 times daily).    [provider]  clonazePAM (KLONOPIN) 1 MG tablet Take 0.5-1 mg by mouth 2 (two) times daily.    [provider]  cyclobenzaprine (FLEXERIL) 10 MG tablet Take 10 mg by mouth 3 (three) times daily as needed for muscle spasms. As needed 10/07/14   [provider]  diclofenac sodium (VOLTAREN) 1 % GEL Apply topically every 4 (four) hours as needed (FOR PAIN).    [provider]  FLUoxetine HCl 60 MG TABS Take 60 mg by mouth daily.    [provider]  lamoTRIgine (LAMICTAL) 100 MG tablet Take 100 mg by mouth at bedtime.    [provider]  methocarbamol (ROBAXIN) 500 MG tablet Take 1 tablet (500 mg total) by mouth 2 (two) times daily as needed for muscle spasms. 03/22/18   Noemi Chapel, MD  naproxen sodium (ANAPROX) 220 MG tablet Take 220 mg by mouth daily  as needed.     [provider]  omeprazole (PRILOSEC) 40 MG capsule Take 40 mg by mouth every morning.    [provider]  ondansetron (ZOFRAN-ODT) 4 MG disintegrating tablet Take 1 tablet (4 mg total) by mouth every 8 (eight) hours as needed for nausea or vomiting. Patient not taking: Reported on 12/16/2017 09/26/17   Davonna Belling, MD  Oxycodone HCl 10 MG TABS Take 10 mg by mouth every 4 (four) hours as needed (FOR PAIN).    [provider]    Family History Family History  Problem Relation Age of Onset  . Diabetes Maternal Grandmother   . Depression Mother   . Anxiety disorder Mother   . Alcohol abuse Father     Social History Social History   Tobacco Use  . Smoking status: Current Some Day Smoker    Packs/day: 1.00    Years: 10.00    Pack years: 10.00    Types: Cigarettes    Last attempt to quit: 04/17/2017    Years  since quitting: 1.0  . Smokeless tobacco: Never Used  Substance Use Topics  . Alcohol use: Not Currently    Alcohol/week: 0.0 standard drinks  . Drug use: No     Allergies   Codeine; Phenergan [promethazine hcl]; and Seroquel [quetiapine fumarate]   Review of Systems Review of Systems  Constitutional: Negative for appetite change and fatigue.  HENT: Negative for congestion, ear discharge and sinus pressure.        Sinus congestion  Eyes: Negative for discharge.  Respiratory: Positive for cough.   Cardiovascular: Negative for chest pain.  Gastrointestinal: Negative for abdominal pain and diarrhea.  Genitourinary: Negative for frequency and hematuria.  Musculoskeletal: Negative for back pain.  Skin: Negative for rash.  Neurological: Negative for seizures and headaches.  Psychiatric/Behavioral: Negative for hallucinations.     Physical Exam Updated Vital Signs BP (!) 141/83 (BP Location: Left Arm)   Pulse 88   Temp 98.3 F (36.8 C) (Oral)   Resp 16   Ht 5\' 2"  (1.575 m)   Wt 136.1 kg   LMP 04/07/2018   SpO2 97%   BMI 54.87 kg/m   Physical Exam  Constitutional: She is oriented to person, place, and time. She appears well-developed.  HENT:  Head: Normocephalic.  Tender frontal sinuses and maxillary sinus  Eyes: Conjunctivae and EOM are normal. No scleral icterus.  Neck: Neck supple. No thyromegaly present.  Cardiovascular: Normal rate and regular rhythm. Exam reveals no gallop and no friction rub.  No murmur heard. Pulmonary/Chest: No stridor. She has no wheezes. She has no rales. She exhibits no tenderness.  Abdominal: She exhibits no distension. There is no tenderness. There is no rebound.  Musculoskeletal: Normal range of motion. She exhibits no edema.  Lymphadenopathy:    She has no cervical adenopathy.  Neurological: She is oriented to person, place, and time. She exhibits normal muscle tone. Coordination normal.  Skin: No rash noted. No erythema.    Psychiatric: She has a normal mood and affect. Her behavior is normal.     ED Treatments / Results  Labs (all labs ordered are listed, but only abnormal results are displayed) Labs Reviewed - No data to display  EKG None  Radiology No results found.  Procedures Procedures (including critical care time)  Medications Ordered in ED Medications - No data to display   Initial Impression / Assessment and Plan / ED Course  I have reviewed the triage vital signs and the  nursing notes.  Pertinent labs & imaging results that were available during my care of the patient were reviewed by me and considered in my medical decision making (see chart for details).     Patient with sinusitis.  She will be started on amoxicillin and follow-up with PCP  Final Clinical Impressions(s) / ED Diagnoses   Final diagnoses:  Acute frontal sinusitis, recurrence not specified    ED Discharge Orders         Ordered    amoxicillin (AMOXIL) 500 MG capsule  3 times daily     04/30/18 0908           Milton Ferguson, MD 04/30/18 [phone]

## 2018-04-30 NOTE — ED Triage Notes (Signed)
Pt c/o nasal congestion and drainage, productive green colored cough, headache, bilateral eyes "crusting", bilateral decreased hearing, mainly in right ear, "cold sore blisters all over my face" x 3 weeks. Pt has used OTC day and night sinus medication with no relief. Unknown fever.

## 2018-07-29 ENCOUNTER — Encounter (HOSPITAL_COMMUNITY): Payer: Self-pay | Admitting: Emergency Medicine

## 2018-07-29 ENCOUNTER — Emergency Department (HOSPITAL_COMMUNITY)
Admission: EM | Admit: 2018-07-29 | Discharge: 2018-07-29 | Disposition: A | Discharge status: Home / Self Care | Payer: Self-pay | Attending: Emergency Medicine | Admitting: Emergency Medicine

## 2018-07-29 ENCOUNTER — Other Ambulatory Visit: Payer: Self-pay

## 2018-07-29 ENCOUNTER — Emergency Department (HOSPITAL_COMMUNITY): Payer: Self-pay

## 2018-07-29 DIAGNOSIS — F17210 Nicotine dependence, cigarettes, uncomplicated: Secondary | ICD-10-CM | POA: Insufficient documentation

## 2018-07-29 DIAGNOSIS — F419 Anxiety disorder, unspecified: Secondary | ICD-10-CM | POA: Insufficient documentation

## 2018-07-29 DIAGNOSIS — Z79899 Other long term (current) drug therapy: Secondary | ICD-10-CM | POA: Insufficient documentation

## 2018-07-29 DIAGNOSIS — I10 Essential (primary) hypertension: Secondary | ICD-10-CM | POA: Insufficient documentation

## 2018-07-29 DIAGNOSIS — R109 Unspecified abdominal pain: Secondary | ICD-10-CM

## 2018-07-29 DIAGNOSIS — F329 Major depressive disorder, single episode, unspecified: Secondary | ICD-10-CM | POA: Insufficient documentation

## 2018-07-29 DIAGNOSIS — R1031 Right lower quadrant pain: Secondary | ICD-10-CM | POA: Insufficient documentation

## 2018-07-29 LAB — CBC WITH DIFFERENTIAL/PLATELET
Abs Immature Granulocytes: 0.06 10*3/uL (ref 0.00–0.07)
Basophils Absolute: 0.1 10*3/uL (ref 0.0–0.1)
Basophils Relative: 1 %
Eosinophils Absolute: 0.2 10*3/uL (ref 0.0–0.5)
Eosinophils Relative: 1 %
HEMATOCRIT: 46.1 % — AB (ref 36.0–46.0)
Hemoglobin: 14.2 g/dL (ref 12.0–15.0)
Immature Granulocytes: 1 %
LYMPHS ABS: 3.6 10*3/uL (ref 0.7–4.0)
Lymphocytes Relative: 27 %
MCH: 26 pg (ref 26.0–34.0)
MCHC: 30.8 g/dL (ref 30.0–36.0)
MCV: 84.3 fL (ref 80.0–100.0)
MONOS PCT: 4 %
Monocytes Absolute: 0.5 10*3/uL (ref 0.1–1.0)
Neutro Abs: 8.9 10*3/uL — ABNORMAL HIGH (ref 1.7–7.7)
Neutrophils Relative %: 66 %
Platelets: 331 10*3/uL (ref 150–400)
RBC: 5.47 MIL/uL — ABNORMAL HIGH (ref 3.87–5.11)
RDW: 14.7 % (ref 11.5–15.5)
WBC: 13.3 10*3/uL — ABNORMAL HIGH (ref 4.0–10.5)
nRBC: 0 % (ref 0.0–0.2)

## 2018-07-29 LAB — COMPREHENSIVE METABOLIC PANEL
ALT: 17 U/L (ref 0–44)
AST: 21 U/L (ref 15–41)
Albumin: 3.8 g/dL (ref 3.5–5.0)
Alkaline Phosphatase: 95 U/L (ref 38–126)
Anion gap: 10 (ref 5–15)
BILIRUBIN TOTAL: 0.3 mg/dL (ref 0.3–1.2)
BUN: 11 mg/dL (ref 6–20)
CO2: 23 mmol/L (ref 22–32)
Calcium: 9.1 mg/dL (ref 8.9–10.3)
Chloride: 104 mmol/L (ref 98–111)
Creatinine, Ser: 0.58 mg/dL (ref 0.44–1.00)
GFR calc Af Amer: >60 mL/min (ref >60)
GFR calc non Af Amer: >60 mL/min (ref >60)
Glucose, Bld: 117 mg/dL — ABNORMAL HIGH (ref 70–99)
Potassium: 3.7 mmol/L (ref 3.5–5.1)
Sodium: 137 mmol/L (ref 135–145)
Total Protein: 7.5 g/dL (ref 6.5–8.1)

## 2018-07-29 LAB — URINALYSIS, ROUTINE W REFLEX MICROSCOPIC
Bilirubin Urine: NEGATIVE
Glucose, UA: NEGATIVE mg/dL
Hgb urine dipstick: NEGATIVE
Ketones, ur: NEGATIVE mg/dL
Leukocytes, UA: NEGATIVE
Nitrite: NEGATIVE
Protein, ur: NEGATIVE mg/dL
Specific Gravity, Urine: 1.02 (ref 1.005–1.030)
pH: 5 (ref 5.0–8.0)

## 2018-07-29 LAB — LIPASE, BLOOD: Lipase: 25 U/L (ref 11–51)

## 2018-07-29 LAB — I-STAT BETA HCG BLOOD, ED (MC, WL, AP ONLY): I-stat hCG, quantitative: <5 m[IU]/mL (ref <5)

## 2018-07-29 LAB — PREGNANCY, URINE: Preg Test, Ur: NEGATIVE

## 2018-07-29 MED ORDER — HYDROCODONE-ACETAMINOPHEN 5-325 MG PO TABS: 1.0000 | ORAL_TABLET | Freq: Four times a day (QID) | ORAL | 0 refills | Status: DC | PRN

## 2018-07-29 MED ORDER — SODIUM CHLORIDE 0.9 % IV BOLUS
500.0000 mL | Freq: Once | INTRAVENOUS | Status: AC
  Administered 2018-07-29: 21:00:00 via INTRAVENOUS

## 2018-07-29 MED ORDER — HYDROMORPHONE HCL 1 MG/ML IJ SOLN
0.5000 mg | Freq: Once | INTRAMUSCULAR | Status: AC
  Administered 2018-07-29: 0.5 mg via INTRAVENOUS
  Filled 2018-07-29: qty 1

## 2018-07-29 MED ORDER — HYDROMORPHONE HCL 1 MG/ML IJ SOLN
1.0000 mg | Freq: Once | INTRAMUSCULAR | Status: AC
  Administered 2018-07-29: 1 mg via INTRAVENOUS
  Filled 2018-07-29: qty 1

## 2018-07-29 MED ORDER — ONDANSETRON HCL 4 MG/2ML IJ SOLN
4.0000 mg | Freq: Once | INTRAMUSCULAR | Status: AC
  Administered 2018-07-29: 4 mg via INTRAVENOUS
  Filled 2018-07-29: qty 2

## 2018-07-29 MED ORDER — ONDANSETRON 4 MG PO TBDP: ORAL_TABLET | ORAL | 0 refills | Status: DC

## 2018-07-29 NOTE — ED Triage Notes (Signed)
Pt C/O right flank and back pain that started 4 days ago. Pt reports nausea. Pt also reports blood in her urine.

## 2018-07-29 NOTE — ED Provider Notes (Signed)
[facility] EMERGENCY DEPARTMENT Provider Note   CSN: [id number] Arrival date & time: 07/29/18  1918     History   Chief Complaint Chief Complaint  Patient presents with  . Flank Pain    HPI Angela Massey is a 43 y.o. female.  Patient complains of severe right flank plain.  She also has had some blood in her urine once  The history is provided by the patient. No language interpreter was used.  Flank Pain  This is a new problem. The problem occurs constantly. The problem has not changed since onset.Pertinent negatives include no chest pain, no abdominal pain and no headaches. Nothing aggravates the symptoms. Nothing relieves the symptoms. She has tried nothing for the symptoms.    Past Medical History:  Diagnosis Date  . Anxiety   . Arthritis    back and ankles  . Depression   . Fatty liver   . Fibromyalgia   . Hypertension   . Hypertriglyceridemia   . Ovarian abscess   . PCOS (polycystic ovarian syndrome)   . Sciatica     Patient Active Problem List   Diagnosis Date Noted  . MDD (major depressive disorder), recurrent episode, moderate ([place]) 10/11/2015  . GAD (generalized anxiety disorder) 10/11/2015  . Panic disorder without agoraphobia 10/11/2015  . Insomnia 10/11/2015  . Hyperprolactinemia ([place]) 05/26/2013  . Depression, major 05/26/2013    Past Surgical History:  Procedure Laterality Date  . Diagnostic Laparotomy    . planter facitis    . TUBAL LIGATION       OB History    Gravida  4   Para  3   Term  3   Preterm      AB      Living  3     SAB      TAB      Ectopic      Multiple      Live Births               Home Medications    Prior to Admission medications   Medication Sig Start Date End Date Taking? Authorizing Provider  aspirin-acetaminophen-caffeine (EXCEDRIN MIGRAINE) [phone] MG tablet Take 1 tablet by mouth every 6 (six) hours as needed for headache (Takes 1-2 times daily).   Yes [provider]    clonazePAM (KLONOPIN) 1 MG tablet Take 0.5-1 mg by mouth 2 (two) times daily.   Yes [provider]  cyclobenzaprine (FLEXERIL) 10 MG tablet Take 10 mg by mouth 3 (three) times daily as needed for muscle spasms. As needed 10/07/14  Yes [provider]  docusate sodium (COLACE) 100 MG capsule Take 100 mg by mouth daily as needed for mild constipation.   Yes [provider]  ferrous sulfate 325 (65 FE) MG EC tablet Take 325 mg by mouth daily as needed (FOR SUPPLEMENT).   Yes [provider]  FLUoxetine HCl 60 MG TABS Take 60 mg by mouth daily.   Yes [provider]  fluticasone (FLONASE) 50 MCG/ACT nasal spray Place 2 sprays into both nostrils daily as needed for allergies or rhinitis.   Yes [provider]  gabapentin (NEURONTIN) 300 MG capsule Take 300 mg by mouth 3 (three) times daily. 07/05/18  Yes [provider]  ibuprofen (ADVIL,MOTRIN) 200 MG tablet Take 800 mg by mouth every 6 (six) hours as needed for mild pain or moderate pain.   Yes [provider]  lamoTRIgine (LAMICTAL) 100 MG tablet Take 100 mg  by mouth 2 (two) times daily.    Yes [provider]  magnesium hydroxide (MILK OF MAGNESIA) 400 MG/5ML suspension Take 30 mLs by mouth daily as needed for mild constipation or moderate constipation.   Yes [provider]  nicotine (NICODERM CQ - DOSED IN MG/24 HOURS) 21 mg/24hr patch Place 21 mg onto the skin daily.   Yes [provider]  omeprazole (PRILOSEC) 40 MG capsule Take 40 mg by mouth at bedtime.    Yes [provider]  Oxycodone HCl 10 MG TABS Take 10 mg by mouth every 4 (four) hours as needed (FOR PAIN).   Yes [provider]  phentermine (ADIPEX-P) 37.5 MG tablet Take 37.5 mg by mouth every morning.  06/13/18  Yes [provider]  azithromycin (ZITHROMAX) 250 MG tablet Take 250-500 mg by mouth See admin instructions. Starting on 07/22/2018 take 500mg  then take  250mg  on days 2 through 5 07/22/18   [provider]  HYDROcodone-acetaminophen (NORCO/VICODIN) 5-325 MG tablet Take 1 tablet by mouth every 6 (six) hours as needed for moderate pain. 07/29/18   Milton Ferguson, MD  ondansetron (ZOFRAN ODT) 4 MG disintegrating tablet 4mg  ODT q4 hours prn nausea/vomit 07/29/18   Milton Ferguson, MD    Family History Family History  Problem Relation Age of Onset  . Diabetes Maternal Grandmother   . Depression Mother   . Anxiety disorder Mother   . Alcohol abuse Father     Social History Social History   Tobacco Use  . Smoking status: Current Some Day Smoker    Packs/day: 1.00    Years: 10.00    Pack years: 10.00    Types: Cigarettes    Last attempt to quit: 04/17/2017    Years since quitting: 1.2  . Smokeless tobacco: Never Used  Substance Use Topics  . Alcohol use: Not Currently    Alcohol/week: 0.0 standard drinks  . Drug use: No     Allergies   Codeine; Phenergan [promethazine hcl]; and Seroquel [quetiapine fumarate]   Review of Systems Review of Systems  Constitutional: Negative for appetite change and fatigue.  HENT: Negative for congestion, ear discharge and sinus pressure.   Eyes: Negative for discharge.  Respiratory: Negative for cough.   Cardiovascular: Negative for chest pain.  Gastrointestinal: Negative for abdominal pain and diarrhea.  Genitourinary: Positive for flank pain. Negative for frequency and hematuria.       Hematuria  Musculoskeletal: Negative for back pain.  Skin: Negative for rash.  Neurological: Negative for seizures and headaches.  Psychiatric/Behavioral: Negative for hallucinations.     Physical Exam Updated Vital Signs BP 118/61 (BP Location: Right Arm)   Pulse 96   Temp 98.1 F (36.7 C) (Oral)   Resp 16   Ht 5\' 2"  (1.575 m)   Wt 136.1 kg   LMP 06/03/2018 (Within Months)   SpO2 96%   BMI 54.87 kg/m   Physical Exam Vitals signs and nursing note reviewed.  Constitutional:       Appearance: She is well-developed.  HENT:     Head: Normocephalic.     Nose: Nose normal.  Eyes:     General: No scleral icterus.    Conjunctiva/sclera: Conjunctivae normal.  Neck:     Musculoskeletal: Neck supple.     Thyroid: No thyromegaly.  Cardiovascular:     Rate and Rhythm: Normal rate and regular rhythm.     Heart sounds: No murmur. No friction rub. No gallop.   Pulmonary:  Breath sounds: No stridor. No wheezing or rales.  Chest:     Chest wall: No tenderness.  Abdominal:     General: There is no distension.     Tenderness: There is no abdominal tenderness. There is right CVA tenderness. There is no rebound.  Musculoskeletal: Normal range of motion.     Comments: Tender right flank  Lymphadenopathy:     Cervical: No cervical adenopathy.  Skin:    Findings: No erythema or rash.  Neurological:     Mental Status: She is oriented to person, place, and time.     Motor: No abnormal muscle tone.     Coordination: Coordination normal.  Psychiatric:        Behavior: Behavior normal.      ED Treatments / Results  Labs (all labs ordered are listed, but only abnormal results are displayed) Labs Reviewed  CBC WITH DIFFERENTIAL/PLATELET - Abnormal; Notable for the following components:      Result Value   WBC 13.3 (*)    RBC 5.47 (*)    HCT 46.1 (*)    Neutro Abs 8.9 (*)    All other components within normal limits  COMPREHENSIVE METABOLIC PANEL - Abnormal; Notable for the following components:   Glucose, Bld 117 (*)    All other components within normal limits  URINE CULTURE  URINALYSIS, ROUTINE W REFLEX MICROSCOPIC  LIPASE, BLOOD  PREGNANCY, URINE  I-STAT BETA HCG BLOOD, ED ([facility], [facility], [facility] ONLY)    EKG None  Radiology Ct Renal Stone Study  Result Date: 07/29/2018 CLINICAL DATA:  Right flank pain. Hematuria. EXAM: CT ABDOMEN AND PELVIS WITHOUT CONTRAST TECHNIQUE: Multidetector CT imaging of the abdomen and pelvis was performed following the standard protocol  without IV contrast. COMPARISON:  CT 09/25/2017 FINDINGS: Lower chest: The lung bases are clear. Hepatobiliary: Enlarged liver spanning 21 cm cranial caudal with steatosis. Fatty sparing adjacent the gallbladder fossa. Gallbladder is nondistended. No calcified gallstone or biliary dilatation. Pancreas: No ductal dilatation or inflammation. Spleen: Normal in size without focal abnormality. Adrenals/Urinary Tract: Normal adrenal glands. No hydronephrosis, perinephric edema, or urolithiasis. Both ureters are decompressed without stones along the course. Urinary bladder is physiologically distended. No stone or bladder wall thickening. Calcification in the mid pelvis is within the anterior uterus. Stomach/Bowel: Ingested material in the stomach. No gastric wall thickening. No small bowel dilatation, inflammatory change or obstruction. Normal appendix. Moderate colonic stool burden without colonic wall thickening or inflammatory change. No abnormal rectal distention. Vascular/Lymphatic: Normal course and caliber of noncontrast vascular structures. Small central mesenteric nodes not enlarged by size criteria. Prominent porta hepatis nodes are likely reactive. No pelvic adenopathy. Reproductive: Tiny calcification in the anterior uterus suggesting scarring. Uterus is normal in size. Both ovaries are visualized, symmetric in size and unremarkable. Other: No free air, free fluid, or intra-abdominal fluid collection. Small fat containing umbilical hernia. Musculoskeletal: There are no acute or suspicious osseous abnormalities. Facet arthropathy in the lumbar spine. Dependent edema in the subcutaneous tissues posteriorly as before. IMPRESSION: 1. No renal stones or obstructive uropathy. No acute findings in the abdomen or pelvis. 2. Hepatomegaly and hepatic steatosis. Capsular stretching of the liver may be the cause of right sided abdominal pain. Electronically Signed   By: Keith Rake M.D.   On: 07/29/2018 21:30     Procedures Procedures (including critical care time)  Medications Ordered in ED Medications  HYDROmorphone (DILAUDID) injection 0.5 mg (has no administration in time range)  HYDROmorphone (DILAUDID) injection 1 mg (1  mg Intravenous Given 07/29/18 2109)  ondansetron [facility]) injection 4 mg (4 mg Intravenous Given 07/29/18 2109)  sodium chloride 0.9 % bolus 500 mL ( Intravenous New Bag/Given 07/29/18 2111)     Initial Impression / Assessment and Plan / ED Course  I have reviewed the triage vital signs and the nursing notes.  Pertinent labs & imaging results that were available during my care of the patient were reviewed by me and considered in my medical decision making (see chart for details).    Labs and CT unremarkable.  Patient will have urine cultured and she will follow-up with her primary care doctor.  Possible she passed a kidney stone and she has had spasm from her ureter  Final Clinical Impressions(s) / ED Diagnoses   Final diagnoses:  Right flank pain    ED Discharge Orders         Ordered    HYDROcodone-acetaminophen (NORCO/VICODIN) 5-325 MG tablet  Every 6 hours PRN     07/29/18 2232    ondansetron (ZOFRAN ODT) 4 MG disintegrating tablet     07/29/18 2232           Milton Ferguson, MD 07/29/18 2234

## 2018-07-29 NOTE — Discharge Instructions (Addendum)
Drink plenty of fluids and follow-up with your doctor in 2 to 3 days for recheck

## 2018-08-02 LAB — URINE CULTURE: Culture: 30000 — AB

## 2018-08-03 ENCOUNTER — Telehealth: Payer: Self-pay | Admitting: Emergency Medicine

## 2018-08-03 NOTE — Telephone Encounter (Signed)
Post ED Visit - Positive Culture Follow-up  Culture report reviewed by antimicrobial stewardship pharmacist:  []  Angela Massey, Pharm.D. []  Angela Massey, Pharm.D., BCPS AQ-ID [x]  Angela Massey, Pharm.D., BCPS []  Angela Massey, Pharm.D., BCPS []  Angela Massey, Pharm.D., BCPS, AAHIVP []  Angela Massey, Pharm.D., BCPS, AAHIVP []  Angela Massey, PharmD, BCPS []  Angela Massey, PharmD, BCPS []  Angela Massey, PharmD, BCPS []  Angela Massey, PharmD  Positive urine culture Treated with Azithromycin, organism sensitive to the same and no further patient follow-up is required at this time.  Angela Massey 08/03/2018, 1:02 PM

## 2019-02-04 ENCOUNTER — Encounter (HOSPITAL_COMMUNITY): Payer: Self-pay | Admitting: Emergency Medicine

## 2019-02-04 ENCOUNTER — Other Ambulatory Visit: Payer: Self-pay

## 2019-02-04 ENCOUNTER — Emergency Department (HOSPITAL_COMMUNITY)
Admission: EM | Admit: 2019-02-04 | Discharge: 2019-02-04 | Disposition: A | Discharge status: Home / Self Care | Payer: Medicaid Other | Attending: Emergency Medicine | Admitting: Emergency Medicine

## 2019-02-04 DIAGNOSIS — L739 Follicular disorder, unspecified: Secondary | ICD-10-CM | POA: Insufficient documentation

## 2019-02-04 DIAGNOSIS — B379 Candidiasis, unspecified: Secondary | ICD-10-CM | POA: Insufficient documentation

## 2019-02-04 DIAGNOSIS — I10 Essential (primary) hypertension: Secondary | ICD-10-CM | POA: Insufficient documentation

## 2019-02-04 DIAGNOSIS — F17210 Nicotine dependence, cigarettes, uncomplicated: Secondary | ICD-10-CM | POA: Insufficient documentation

## 2019-02-04 DIAGNOSIS — Z79899 Other long term (current) drug therapy: Secondary | ICD-10-CM | POA: Insufficient documentation

## 2019-02-04 MED ORDER — CLINDAMYCIN HCL 300 MG PO CAPS: 300.0000 mg | ORAL_CAPSULE | Freq: Three times a day (TID) | ORAL | 0 refills | Status: AC

## 2019-02-04 MED ORDER — NYSTATIN 100000 UNIT/GM EX CREA: TOPICAL_CREAM | CUTANEOUS | 0 refills | Status: DC

## 2019-02-04 MED ORDER — CLINDAMYCIN HCL 150 MG PO CAPS
300.0000 mg | ORAL_CAPSULE | Freq: Once | ORAL | Status: AC
  Administered 2019-02-04: 300 mg via ORAL
  Filled 2019-02-04: qty 2

## 2019-02-04 NOTE — Discharge Instructions (Addendum)
Take the antibiotics as prescribed. Follow up for wound evaluation in 2 days. Return for new or worsening symptoms.

## 2019-02-04 NOTE — ED Triage Notes (Signed)
Pt c/o bumps on her lower abdomen that started x 5 days ago. States that wearing blue jeans yesterday made it worse. States that it has drained some last night

## 2019-02-04 NOTE — ED Provider Notes (Signed)
[facility] EMERGENCY DEPARTMENT Provider Note   CSN: [id number] Arrival date & time: 02/04/19  2223   History   Chief Complaint Chief Complaint  Patient presents with   Rash    HPI Angela Massey is a 43 y.o. female with past medical history significant for hypertension, hyperlipidemia, PCOS, sciatica, fatty liver, arthritis who presents for evaluation of rash.  Patient states she developed a rash to the underside of her stomach approximately 4 days ago.  Rash is been pruritic.  Patient states she had one area which started draining last night.  Denies fever, chills, nausea, vomiting, chest pain, shortness of breath, abdominal pain, diarrhea, dysuria, swelling, warmth.  Denies prior history of abscesses.  States this area has become painful to touch with her pressure.  She rates her current pain a 2/10.  Denies radiation of pain.  Denies additional rating alleviating factors  History obtained from patient and past medical records.  No interpreter was used.     HPI  Past Medical History:  Diagnosis Date   Anxiety    Arthritis    back and ankles   Depression    Fatty liver    Fibromyalgia    Hypertension    Hypertriglyceridemia    Ovarian abscess    PCOS (polycystic ovarian syndrome)    Sciatica     Patient Active Problem List   Diagnosis Date Noted   MDD (major depressive disorder), recurrent episode, moderate ([place]) 10/11/2015   GAD (generalized anxiety disorder) 10/11/2015   Panic disorder without agoraphobia 10/11/2015   Insomnia 10/11/2015   Hyperprolactinemia ([place]) 05/26/2013   Depression, major 05/26/2013    Past Surgical History:  Procedure Laterality Date   Diagnostic Laparotomy     planter facitis     TUBAL LIGATION       OB History    Gravida  4   Para  3   Term  3   Preterm      AB      Living  3     SAB      TAB      Ectopic      Multiple      Live Births               Home Medications    Prior to  Admission medications   Medication Sig Start Date End Date Taking? Authorizing Provider  aspirin-acetaminophen-caffeine (EXCEDRIN MIGRAINE) [phone] MG tablet Take 1 tablet by mouth every 6 (six) hours as needed for headache (Takes 1-2 times daily).    [provider]  azithromycin (ZITHROMAX) 250 MG tablet Take 250-500 mg by mouth See admin instructions. Starting on 07/22/2018 take 500mg  then take 250mg  on days 2 through 5 07/22/18   [provider]  clindamycin (CLEOCIN) 300 MG capsule Take 1 capsule (300 mg total) by mouth 3 (three) times daily for 7 days. 02/04/19 02/11/19  Ronaldo Crilly A, PA-C  clonazePAM (KLONOPIN) 1 MG tablet Take 0.5-1 mg by mouth 2 (two) times daily.    [provider]  cyclobenzaprine (FLEXERIL) 10 MG tablet Take 10 mg by mouth 3 (three) times daily as needed for muscle spasms. As needed 10/07/14   [provider]  docusate sodium (COLACE) 100 MG capsule Take 100 mg by mouth daily as needed for mild constipation.    [provider]  ferrous sulfate 325 (65 FE) MG EC tablet Take 325 mg by mouth daily as needed (FOR SUPPLEMENT).    [provider]  FLUoxetine HCl 60 MG TABS Take 60 mg by mouth daily.    [provider]  fluticasone (FLONASE) 50 MCG/ACT nasal spray Place 2 sprays into both nostrils daily as needed for allergies or rhinitis.    [provider]  gabapentin (NEURONTIN) 300 MG capsule Take 300 mg by mouth 3 (three) times daily. 07/05/18   [provider]  HYDROcodone-acetaminophen (NORCO/VICODIN) 5-325 MG tablet Take 1 tablet by mouth every 6 (six) hours as needed for moderate pain. 07/29/18   Milton Ferguson, MD  ibuprofen (ADVIL,MOTRIN) 200 MG tablet Take 800 mg by mouth every 6 (six) hours as needed for mild pain or moderate pain.    [provider]  lamoTRIgine (LAMICTAL) 100 MG tablet Take 100 mg by mouth 2 (two) times daily.     [provider]  magnesium hydroxide  (MILK OF MAGNESIA) 400 MG/5ML suspension Take 30 mLs by mouth daily as needed for mild constipation or moderate constipation.    [provider]  nicotine (NICODERM CQ - DOSED IN MG/24 HOURS) 21 mg/24hr patch Place 21 mg onto the skin daily.    [provider]  nystatin cream (MYCOSTATIN) Apply to affected area 2 times daily 02/04/19   Janaisa Birkland A, PA-C  omeprazole (PRILOSEC) 40 MG capsule Take 40 mg by mouth at bedtime.     [provider]  ondansetron (ZOFRAN ODT) 4 MG disintegrating tablet 4mg  ODT q4 hours prn nausea/vomit 07/29/18   Milton Ferguson, MD  Oxycodone HCl 10 MG TABS Take 10 mg by mouth every 4 (four) hours as needed (FOR PAIN).    [provider]  phentermine (ADIPEX-P) 37.5 MG tablet Take 37.5 mg by mouth every morning.  06/13/18   [provider]    Family History Family History  Problem Relation Age of Onset   Diabetes Maternal Grandmother    Depression Mother    Anxiety disorder Mother    Alcohol abuse Father     Social History Social History   Tobacco Use   Smoking status: Current Some Day Smoker    Packs/day: 1.00    Years: 10.00    Pack years: 10.00    Types: Cigarettes    Last attempt to quit: 04/17/2017    Years since quitting: 1.8   Smokeless tobacco: Never Used  Substance Use Topics   Alcohol use: Not Currently    Alcohol/week: 0.0 standard drinks   Drug use: No     Allergies   Codeine, Phenergan [promethazine hcl], and Seroquel [quetiapine fumarate]   Review of Systems Review of Systems  Constitutional: Negative.   HENT: Negative.   Respiratory: Negative.   Cardiovascular: Negative.   Gastrointestinal: Negative.   Genitourinary: Negative.   Musculoskeletal: Negative.   Skin: Positive for rash.  Neurological: Negative.   All other systems reviewed and are negative.  Physical Exam Updated Vital Signs BP 126/66 (BP Location: Left Arm)    Pulse 93    Temp 98.4 F (36.9 C)    Resp  18    Ht 5\' 2"  (1.575 m)    Wt (!) 137 kg    LMP 01/27/2019    SpO2 98%    BMI 55.24 kg/m   Physical Exam Vitals signs and nursing note reviewed.  Constitutional:      General: She is not in acute distress.    Appearance: She is well-developed. She is obese. She is not ill-appearing, toxic-appearing or diaphoretic.  HENT:     Head: Normocephalic and atraumatic.  Mouth/Throat:     Mouth: Mucous membranes are moist.     Pharynx: Oropharynx is clear.  Eyes:     Pupils: Pupils are equal, round, and reactive to light.  Neck:     Musculoskeletal: Normal range of motion.  Cardiovascular:     Rate and Rhythm: Normal rate.     Pulses: Normal pulses.     Heart sounds: Normal heart sounds.  Pulmonary:     Effort: Pulmonary effort is normal. No respiratory distress.     Breath sounds: Normal breath sounds.  Abdominal:     General: Bowel sounds are normal. There is no distension.     Palpations: Abdomen is soft.     Tenderness: There is no abdominal tenderness.     Comments: Soft, nontender without rebound or guarding.  Patient with large abdominal wall pannus.  Right sided abdominal pannus crease with erythema and rash.  No evidence of abdominal wall herniations.  Musculoskeletal: Normal range of motion.  Skin:    General: Skin is warm and dry.     Comments: Erythema to right abdominal wall pannus.  Lesions consistent with fungal infection as well as 2 lesions with possible early abscess.  Mild, 1.5 cm rounded area of induration without fluctuance.  Area does not extend into the GU region.  Area superior and lateral to this with infected hair follicle which is actively draining.  No target lesions, bulla, blistering, sinus tracts, impetigo or vesicles.  Neurological:     Mental Status: She is alert.        ED Treatments / Results  Labs (all labs ordered are listed, but only abnormal results are displayed) Labs Reviewed - No data to display  EKG None  Radiology No results  found.  Procedures Procedures (including critical care time)  Medications Ordered in ED Medications  clindamycin (CLEOCIN) capsule 300 mg (300 mg Oral Given 02/04/19 2306)   Initial Impression / Assessment and Plan / ED Course  I have reviewed the triage vital signs and the nursing notes.  Pertinent labs & imaging results that were available during my care of the patient were reviewed by me and considered in my medical decision making (see chart for details).  57 old female appears otherwise well presents for evaluation of rash.  She is afebrile, nonseptic, non-ill-appearing.  Patient with erythematous, pruritic rash underneath the right side of her abdominal wall pannus.  She does have area of infected hair follicle which is draining.  Has 1.5 cm area of induration without fluctuance.  No evidence of abscess to drain at this time.  Abdomen soft, nontender without rebound or guarding.  Heart and lungs clear.  Area consistent with early fungal infection as well as possible early bacterial infection/folliculitis.  Will start on antibiotics as well as nystatin.  Patient to follow-up in 2 days for wound recheck.  Discussed with patient heat therapy and to keep area clean.  Return for any new worsening symptoms.  Does not appear septic does not meet Sirs criteria.     Rash consistent with combination of yeast and early abscess. Patient denies any difficulty breathing or swallowing.  Pt has a patent airway without stridor and is handling secretions without difficulty; no angioedema. No blisters, no pustules, no warmth, no draining sinus tracts, no superficial abscesses, no bullous impetigo, no vesicles, no desquamation, no target lesions with dusky purpura or a central bulla. Not tender to touch. No concern for superimposed infection. No concern for SJS, TEN, TSS, tick borne  illness, syphilis or other life-threatening condition. Will discharge home with ABX and nystatin for yeast.  The patient has been  appropriately medically screened and/or stabilized in the ED. I have low suspicion for any other emergent medical condition which would require further screening, evaluation or treatment in the ED or require inpatient management.  Patient is hemodynamically stable and in no acute distress.  Patient able to ambulate in department prior to ED.  Evaluation does not show acute pathology that would require ongoing or additional emergent interventions while in the emergency department or further inpatient treatment.  I have discussed the diagnosis with the patient and answered all questions.  Pain is been managed while in the emergency department and patient has no further complaints prior to discharge.  Patient is comfortable with plan discussed in room and is stable for discharge at this time.  I have discussed strict return precautions for returning to the emergency department.  Patient was encouraged to follow-up with PCP/specialist refer to at discharge.  Final Clinical Impressions(s) / ED Diagnoses   Final diagnoses:  Yeast infection  Folliculitis    ED Discharge Orders         Ordered    clindamycin (CLEOCIN) 300 MG capsule  3 times daily     02/04/19 2318    nystatin cream (MYCOSTATIN)     02/04/19 2318           Kysha Muralles A, PA-C 02/04/19 2319    Milton Ferguson, MD 02/06/19 [id number]

## 2019-03-30 ENCOUNTER — Other Ambulatory Visit: Payer: Self-pay

## 2019-03-30 ENCOUNTER — Encounter (HOSPITAL_COMMUNITY): Payer: Self-pay

## 2019-03-30 ENCOUNTER — Emergency Department (HOSPITAL_COMMUNITY)
Admission: EM | Admit: 2019-03-30 | Discharge: 2019-03-31 | Disposition: A | Discharge status: Home / Self Care | Payer: Self-pay | Attending: Emergency Medicine | Admitting: Emergency Medicine

## 2019-03-30 DIAGNOSIS — I10 Essential (primary) hypertension: Secondary | ICD-10-CM | POA: Insufficient documentation

## 2019-03-30 DIAGNOSIS — F17210 Nicotine dependence, cigarettes, uncomplicated: Secondary | ICD-10-CM | POA: Insufficient documentation

## 2019-03-30 DIAGNOSIS — B001 Herpesviral vesicular dermatitis: Secondary | ICD-10-CM | POA: Insufficient documentation

## 2019-03-30 DIAGNOSIS — R5383 Other fatigue: Secondary | ICD-10-CM

## 2019-03-30 MED ORDER — SODIUM CHLORIDE 0.9% FLUSH: 3.0000 mL | Freq: Once | INTRAVENOUS | Status: DC

## 2019-03-30 NOTE — ED Triage Notes (Signed)
Pt presents to ED with complaints of weakness, headache and confusion x "a couple of weeks." Pt also c/o sore to her tongue x 4 months with shooting pain x 1.5 weeks.

## 2019-03-31 ENCOUNTER — Emergency Department (HOSPITAL_COMMUNITY): Payer: Self-pay

## 2019-03-31 LAB — BASIC METABOLIC PANEL
Anion gap: 8 (ref 5–15)
BUN: 13 mg/dL (ref 6–20)
CO2: 27 mmol/L (ref 22–32)
Calcium: 9 mg/dL (ref 8.9–10.3)
Chloride: 103 mmol/L (ref 98–111)
Creatinine, Ser: 0.54 mg/dL (ref 0.44–1.00)
GFR calc Af Amer: >60 mL/min (ref >60)
GFR calc non Af Amer: >60 mL/min (ref >60)
Glucose, Bld: 96 mg/dL (ref 70–99)
Potassium: 4.2 mmol/L (ref 3.5–5.1)
Sodium: 138 mmol/L (ref 135–145)

## 2019-03-31 LAB — TSH: TSH: 2.414 u[IU]/mL (ref 0.350–4.500)

## 2019-03-31 LAB — URINALYSIS, ROUTINE W REFLEX MICROSCOPIC
Bilirubin Urine: NEGATIVE
Glucose, UA: NEGATIVE mg/dL
Hgb urine dipstick: NEGATIVE
Ketones, ur: NEGATIVE mg/dL
Leukocytes,Ua: NEGATIVE
Nitrite: NEGATIVE
Protein, ur: NEGATIVE mg/dL
Specific Gravity, Urine: 1.017 (ref 1.005–1.030)
pH: 6 (ref 5.0–8.0)

## 2019-03-31 LAB — HEPATIC FUNCTION PANEL
ALT: 17 U/L (ref 0–44)
AST: 15 U/L (ref 15–41)
Albumin: 3.6 g/dL (ref 3.5–5.0)
Alkaline Phosphatase: 81 U/L (ref 38–126)
Bilirubin, Direct: <0.1 mg/dL (ref 0.0–0.2)
Total Bilirubin: 0.3 mg/dL (ref 0.3–1.2)
Total Protein: 7 g/dL (ref 6.5–8.1)

## 2019-03-31 LAB — CBG MONITORING, ED: Glucose-Capillary: 93 mg/dL (ref 70–99)

## 2019-03-31 LAB — CBC
HCT: 41.9 % (ref 36.0–46.0)
Hemoglobin: 13.2 g/dL (ref 12.0–15.0)
MCH: 25.2 pg — ABNORMAL LOW (ref 26.0–34.0)
MCHC: 31.5 g/dL (ref 30.0–36.0)
MCV: 80.1 fL (ref 80.0–100.0)
Platelets: 398 10*3/uL (ref 150–400)
RBC: 5.23 MIL/uL — ABNORMAL HIGH (ref 3.87–5.11)
RDW: 14.4 % (ref 11.5–15.5)
WBC: 16.8 10*3/uL — ABNORMAL HIGH (ref 4.0–10.5)
nRBC: 0 % (ref 0.0–0.2)

## 2019-03-31 LAB — LIPASE, BLOOD: Lipase: 29 U/L (ref 11–51)

## 2019-03-31 LAB — POC URINE PREG, ED: Preg Test, Ur: NEGATIVE

## 2019-03-31 LAB — T4, FREE: Free T4: 0.87 ng/dL (ref 0.61–1.12)

## 2019-03-31 MED ORDER — ACYCLOVIR 800 MG PO TABS
400.0000 mg | ORAL_TABLET | Freq: Once | ORAL | Status: AC
  Administered 2019-03-31: 400 mg via ORAL
  Filled 2019-03-31: qty 1

## 2019-03-31 MED ORDER — ACYCLOVIR 400 MG PO TABS: 400.0000 mg | ORAL_TABLET | Freq: Three times a day (TID) | ORAL | 0 refills | Status: AC

## 2019-03-31 MED ORDER — LIDOCAINE VISCOUS HCL 2 % MT SOLN
15.0000 mL | Freq: Once | OROMUCOSAL | Status: AC
  Administered 2019-03-31: 15 mL via OROMUCOSAL
  Filled 2019-03-31: qty 15

## 2019-03-31 MED ORDER — LIDOCAINE VISCOUS HCL 2 % MT SOLN: 15.0000 mL | OROMUCOSAL | 1 refills | Status: DC | PRN

## 2019-03-31 NOTE — ED Provider Notes (Signed)
Emergency Department Provider Note   I have reviewed the triage vital signs and the nursing notes.   HISTORY  Chief Complaint Weakness   HPI Angela Massey is a 43 y.o. female here with multiple complaints.  Patient is apparently had significant weakness over the last couple months with about 20 pounds of weight loss, decreased appetite.  She states she has difficulty sleeping night when she wakes up every hour to hour and a half.  She has trouble staying awake during the day and has decreased energy not able to get on the bed.  She also states she has an ulcer on her tongue that is similar to cold sores in the past has made her pain so much worse and she has shooting pain from that as well that makes it difficult for her to eat.  No urinary symptoms.  She had recent headaches that are worse than normal.  No neurologic changes associated with those.  No persistent nausea and vomiting or diarrhea and constipation.  No new rashes.  No chest pain or new back pain.  No difficulty breathing.  No recent fevers coughs or sick contacts.   No other associated or modifying symptoms.    Past Medical History:  Diagnosis Date  . Anxiety   . Arthritis    back and ankles  . Depression   . Fatty liver   . Fibromyalgia   . Hypertension   . Hypertriglyceridemia   . Ovarian abscess   . PCOS (polycystic ovarian syndrome)   . Sciatica     Patient Active Problem List   Diagnosis Date Noted  . MDD (major depressive disorder), recurrent episode, moderate ([place]) 10/11/2015  . GAD (generalized anxiety disorder) 10/11/2015  . Panic disorder without agoraphobia 10/11/2015  . Insomnia 10/11/2015  . Hyperprolactinemia ([place]) 05/26/2013  . Depression, major 05/26/2013    Past Surgical History:  Procedure Laterality Date  . Diagnostic Laparotomy    . planter facitis    . TUBAL LIGATION      Current Outpatient Rx  . Order #: [id number] Class: Print  . Order #: [id number] Class: Historical Med  .  Order #: [id number] Class: Historical Med  . Order #: [id number] Class: Historical Med  . Order #: [id number] Class: Historical Med  . Order #: [id number] Class: Historical Med  . Order #: [id number] Class: Historical Med  . Order #: [id number] Class: Historical Med  . Order #: [id number] Class: Historical Med  . Order #: [id number] Class: Historical Med  . Order #: [id number] Class: Normal  . Order #: [id number] Class: Historical Med  . Order #: [id number] Class: Historical Med  . Order #: [id number] Class: Print  . Order #: [id number] Class: Historical Med  . Order #: [id number] Class: Historical Med  . Order #: [id number] Class: Print  . Order #: [id number] Class: Historical Med  . Order #: [id number] Class: Normal  . Order #: [id number] Class: Historical Med  . Order #: [id number] Class: Historical Med    Allergies Codeine, Phenergan [promethazine hcl], and Seroquel [quetiapine fumarate]  Family History  Problem Relation Age of Onset  . Diabetes Maternal Grandmother   . Depression Mother   . Anxiety disorder Mother   . Alcohol abuse Father     Social History Social History   Tobacco Use  . Smoking status: Current Some Day Smoker    Packs/day: 1.00    Years: 10.00    Pack years: 10.00    Types: Cigarettes    Last attempt to quit: 04/17/2017    Years since quitting: 1.9  . Smokeless tobacco: Never Used  Substance Use Topics  . Alcohol use: Not Currently    Alcohol/week: 0.0 standard drinks  . Drug use: No    Review of Systems  All other systems negative except as documented in the HPI. All pertinent positives and negatives as reviewed in the HPI. ____________________________________________   PHYSICAL EXAM:  VITAL SIGNS: ED Triage Vitals  Enc Vitals Group     BP 03/30/19 2254 (!) 164/91     Pulse Rate 03/30/19 2254 86     Resp 03/30/19 2254 20     Temp 03/30/19 2254 98.2 F (36.8 C)     Temp Source 03/30/19 2254 Oral     SpO2 03/30/19 2254 95 %     Weight 03/30/19 2255 300 lb (136.1  kg)     Height 03/30/19 2255 5\' 2"  (1.575 m)     Head Circumference --      Peak Flow --      Pain Score 03/30/19 2255 9     Pain Loc --      Pain Edu? --      Excl. in [place]? --     Constitutional: Alert and oriented. Well appearing and in no acute distress. Eyes: Conjunctivae are normal. PERRL. EOMI. Head: Atraumatic. Nose: No congestion/rhinnorhea. Mouth/Throat: Mucous membranes are moist.  Oropharynx non-erythematous. Neck: No stridor.  No meningeal signs.   Cardiovascular: Normal rate, regular rhythm. Good peripheral circulation. Grossly normal heart sounds.   Respiratory: Normal respiratory effort.  No retractions. Lungs CTAB. Gastrointestinal: Soft and nontender. No distention.  Musculoskeletal: No lower extremity tenderness nor edema. No gross deformities of extremities. Neurologic:  Normal speech and language. No gross focal neurologic deficits are appreciated.  Skin:  Skin is warm, dry and intact. No rash noted.   ____________________________________________   LABS (all labs ordered are listed, but only abnormal results are displayed)  Labs Reviewed  CBC - Abnormal; Notable for the following components:      Result Value   WBC 16.8 (*)    RBC 5.23 (*)    MCH 25.2 (*)    All other components within normal limits  BASIC METABOLIC PANEL  URINALYSIS, ROUTINE W REFLEX MICROSCOPIC  HEPATIC FUNCTION PANEL  TSH  LIPASE, BLOOD  T4, FREE  T3, FREE  CBG MONITORING, ED  POC URINE PREG, ED   ____________________________________________  EKG   EKG Interpretation  Date/Time:  Tuesday March 31 2019 01:30:57 EDT Ventricular Rate:  72 PR Interval:    QRS Duration: 85 QT Interval:  400 QTC Calculation: 438 R Axis:   90 Text Interpretation:  Sinus rhythm Borderline right axis deviation Low voltage, precordial leads No significant change since last tracing Confirmed by Merrily Pew [phone]) on 03/31/2019 1:46:33 AM        ____________________________________________  RADIOLOGY  Ct Head Wo Contrast  Result Date: 03/31/2019 CLINICAL DATA:  Chronic headache, normal neuro exam, confusion for several weeks EXAM: CT HEAD WITHOUT CONTRAST TECHNIQUE: Contiguous axial images were obtained from the base of the skull through the vertex without intravenous contrast. COMPARISON:  MRI 06/08/2017 FINDINGS: Brain: No evidence of acute infarction, hemorrhage, hydrocephalus, extra-axial collection or mass lesion/mass effect. Vascular: No hyperdense vessel or unexpected calcification. Skull: No calvarial fracture or suspicious osseous lesion. No scalp swelling or hematoma. Benign scalp calcifications. Sinuses/Orbits: Paranasal sinuses and mastoid air cells are predominantly clear. Punctate calcification at the level of the right optic disc, possibly optic disc drusen. Other: None IMPRESSION: Calcification of the right optic disc, likely drusen. Otherwise normal noncontrast  head CT. Electronically Signed   By: Lovena Le M.D.   On: 03/31/2019 02:56    ____________________________________________   PROCEDURES  Procedure(s) performed:   Procedures   ____________________________________________   INITIAL IMPRESSION / ASSESSMENT AND PLAN / ED COURSE  Work-up essentially unremarkable.  No obvious causes for symptoms but no indication for further work-up or admission to the hospital at this time.  Patient continue to follow-up with her primary doctor for further management.  Pertinent labs & imaging results that were available during my care of the patient were reviewed by me and considered in my medical decision making (see chart for details).  A medical screening exam was performed and I feel the patient has had an appropriate workup for their chief complaint at this time and likelihood of emergent condition existing is low. They have been counseled on decision, discharge, follow up and which symptoms necessitate immediate  return to the emergency department. They or their family verbally stated understanding and agreement with plan and discharged in stable condition.   ____________________________________________  FINAL CLINICAL IMPRESSION(S) / ED DIAGNOSES  Final diagnoses:  Cold sore  Fatigue, unspecified type     MEDICATIONS GIVEN DURING THIS VISIT:  Medications  sodium chloride flush (NS) 0.9 % injection 3 mL (has no administration in time range)  lidocaine (XYLOCAINE) 2 % viscous mouth solution 15 mL (15 mLs Mouth/Throat Given 03/31/19 0200)  acyclovir (ZOVIRAX) tablet 400 mg (400 mg Oral Given 03/31/19 0200)     NEW OUTPATIENT MEDICATIONS STARTED DURING THIS VISIT:  Discharge Medication List as of 03/31/2019  3:37 AM    START taking these medications   Details  acyclovir (ZOVIRAX) 400 MG tablet Take 1 tablet (400 mg total) by mouth 3 (three) times daily for 10 days., Starting Tue 03/31/2019, Until Fri 04/10/2019, Print    lidocaine (XYLOCAINE) 2 % solution Use as directed 15 mLs in the mouth or throat as needed for mouth pain., Starting Tue 03/31/2019, Print        Note:  This note was prepared with assistance of Dragon voice recognition software. Occasional wrong-word or sound-a-like substitutions may have occurred due to the inherent limitations of voice recognition software.   Nylan Nevel, Corene Cornea, MD 03/31/19 [phone]

## 2019-04-01 LAB — T3, FREE: T3, Free: 3.2 pg/mL (ref 2.0–4.4)

## 2019-05-12 ENCOUNTER — Encounter (HOSPITAL_COMMUNITY): Payer: Self-pay | Admitting: *Deleted

## 2019-05-12 ENCOUNTER — Other Ambulatory Visit: Payer: Self-pay

## 2019-05-12 ENCOUNTER — Emergency Department (HOSPITAL_COMMUNITY)
Admission: EM | Admit: 2019-05-12 | Discharge: 2019-05-12 | Disposition: A | Discharge status: Other Acute Inpatient Hospital | Payer: Medicaid Other | Attending: Emergency Medicine | Admitting: Emergency Medicine

## 2019-05-12 DIAGNOSIS — T1491XA Suicide attempt, initial encounter: Secondary | ICD-10-CM

## 2019-05-12 DIAGNOSIS — Z20828 Contact with and (suspected) exposure to other viral communicable diseases: Secondary | ICD-10-CM | POA: Insufficient documentation

## 2019-05-12 DIAGNOSIS — F17210 Nicotine dependence, cigarettes, uncomplicated: Secondary | ICD-10-CM | POA: Insufficient documentation

## 2019-05-12 DIAGNOSIS — X788XXA Intentional self-harm by other sharp object, initial encounter: Secondary | ICD-10-CM | POA: Insufficient documentation

## 2019-05-12 DIAGNOSIS — Z79899 Other long term (current) drug therapy: Secondary | ICD-10-CM | POA: Insufficient documentation

## 2019-05-12 DIAGNOSIS — R45851 Suicidal ideations: Secondary | ICD-10-CM | POA: Insufficient documentation

## 2019-05-12 DIAGNOSIS — I10 Essential (primary) hypertension: Secondary | ICD-10-CM | POA: Insufficient documentation

## 2019-05-12 DIAGNOSIS — F332 Major depressive disorder, recurrent severe without psychotic features: Secondary | ICD-10-CM | POA: Insufficient documentation

## 2019-05-12 LAB — URINALYSIS, ROUTINE W REFLEX MICROSCOPIC
Bilirubin Urine: NEGATIVE
Glucose, UA: NEGATIVE mg/dL
Ketones, ur: NEGATIVE mg/dL
Nitrite: NEGATIVE
Protein, ur: NEGATIVE mg/dL
RBC / HPF: >50 RBC/hpf — ABNORMAL HIGH (ref 0–5)
Specific Gravity, Urine: 1.02 (ref 1.005–1.030)
pH: 5 (ref 5.0–8.0)

## 2019-05-12 LAB — COMPREHENSIVE METABOLIC PANEL
ALT: 25 U/L (ref 0–44)
AST: 29 U/L (ref 15–41)
Albumin: 3.8 g/dL (ref 3.5–5.0)
Alkaline Phosphatase: 88 U/L (ref 38–126)
Anion gap: 12 (ref 5–15)
BUN: 8 mg/dL (ref 6–20)
CO2: 27 mmol/L (ref 22–32)
Calcium: 9.2 mg/dL (ref 8.9–10.3)
Chloride: 101 mmol/L (ref 98–111)
Creatinine, Ser: 0.65 mg/dL (ref 0.44–1.00)
GFR calc Af Amer: >60 mL/min (ref >60)
GFR calc non Af Amer: >60 mL/min (ref >60)
Glucose, Bld: 94 mg/dL (ref 70–99)
Potassium: 3.8 mmol/L (ref 3.5–5.1)
Sodium: 140 mmol/L (ref 135–145)
Total Bilirubin: 0.4 mg/dL (ref 0.3–1.2)
Total Protein: 7.6 g/dL (ref 6.5–8.1)

## 2019-05-12 LAB — CBC
HCT: 43.6 % (ref 36.0–46.0)
Hemoglobin: 13.5 g/dL (ref 12.0–15.0)
MCH: 24.8 pg — ABNORMAL LOW (ref 26.0–34.0)
MCHC: 31 g/dL (ref 30.0–36.0)
MCV: 80.1 fL (ref 80.0–100.0)
Platelets: 428 10*3/uL — ABNORMAL HIGH (ref 150–400)
RBC: 5.44 MIL/uL — ABNORMAL HIGH (ref 3.87–5.11)
RDW: 14.3 % (ref 11.5–15.5)
WBC: 15.2 10*3/uL — ABNORMAL HIGH (ref 4.0–10.5)
nRBC: 0 % (ref 0.0–0.2)

## 2019-05-12 LAB — RAPID URINE DRUG SCREEN, HOSP PERFORMED
Amphetamines: NOT DETECTED
Barbiturates: NOT DETECTED
Benzodiazepines: NOT DETECTED
Cocaine: NOT DETECTED
Opiates: POSITIVE — AB
Tetrahydrocannabinol: NOT DETECTED

## 2019-05-12 LAB — ACETAMINOPHEN LEVEL: Acetaminophen (Tylenol), Serum: <10 ug/mL — ABNORMAL LOW (ref 10–30)

## 2019-05-12 LAB — SARS CORONAVIRUS 2 BY RT PCR (HOSPITAL ORDER, PERFORMED IN ~~LOC~~ HOSPITAL LAB): SARS Coronavirus 2: NEGATIVE

## 2019-05-12 LAB — ETHANOL: Alcohol, Ethyl (B): <10 mg/dL (ref <10)

## 2019-05-12 LAB — POC URINE PREG, ED: Preg Test, Ur: NEGATIVE

## 2019-05-12 LAB — SALICYLATE LEVEL: Salicylate Lvl: <7 mg/dL (ref 2.8–30.0)

## 2019-05-12 MED ORDER — OXYCODONE HCL 5 MG PO TABS
10.0000 mg | ORAL_TABLET | Freq: Once | ORAL | Status: AC
  Administered 2019-05-12: 10 mg via ORAL
  Filled 2019-05-12: qty 2

## 2019-05-12 MED ORDER — NICOTINE 21 MG/24HR TD PT24
21.0000 mg | MEDICATED_PATCH | Freq: Every day | TRANSDERMAL | Status: DC
  Administered 2019-05-12: 15:00:00 21 mg via TRANSDERMAL
  Filled 2019-05-12: qty 1

## 2019-05-12 NOTE — ED Notes (Signed)
[facility] called for clarification of patient status.

## 2019-05-12 NOTE — ED Notes (Signed)
Clinical research associate for transport to [facility]

## 2019-05-12 NOTE — Progress Notes (Signed)
Pt meets inpatient criteria per Mordecai Maes, NP. Referral information has been sent to the following hospitals for review:  [facility]  [facility]  [facility]  [facility]     Disposition will continue to follow for inpatient placement needs.   Audree Camel, LCSW, [place] Disposition [facility] [phone]

## 2019-05-12 NOTE — ED Notes (Signed)
Still waiting on transporters to get here.
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
## 2019-05-12 NOTE — ED Provider Notes (Addendum)
[facility] EMERGENCY DEPARTMENT Provider Note   CSN: [id number] Arrival date & time: 05/12/19  [id number]     History   Chief Complaint Chief Complaint  Patient presents with  . V70.1    HPI Angela Massey is a 43 y.o. female.     HPI   Angela Massey is a 43 y.o. female with past medical history of anxiety, depression, fibromyalgia, hypertension, and polycystic ovarian syndrome.  She  presents to the Emergency Department stating that she wants to harm herself.  She reports increased feelings of wanting to harm herself due to multiple situations at home.  She states that her 5 year old daughter has recently moved away and that her husband has told her that she either needed to get help or he wanted out of their marriage.  She states she found out that he has been having an affair.  Last evening, she states she used a razor blade to make several small cuts to her right wrist.  She states that she believes she feels this way because her medications (Lamictal and fluoxetine) are no longer working for her and she currently has no insurance and has not been able to see a counselor for some time.  She denies alcohol use, drug use, auditory or visual hallucinations, homicidal thoughts or plans.  She denies any chest pain, shortness of breath, abdominal pain or vomiting.  No recent Covid exposures.    Past Medical History:  Diagnosis Date  . Anxiety   . Arthritis    back and ankles  . Depression   . Fatty liver   . Fibromyalgia   . Hypertension   . Hypertriglyceridemia   . Ovarian abscess   . PCOS (polycystic ovarian syndrome)   . Sciatica     Patient Active Problem List   Diagnosis Date Noted  . MDD (major depressive disorder), recurrent episode, moderate ([place]) 10/11/2015  . GAD (generalized anxiety disorder) 10/11/2015  . Panic disorder without agoraphobia 10/11/2015  . Insomnia 10/11/2015  . Hyperprolactinemia ([place]) 05/26/2013  . Depression, major 05/26/2013    Past  Surgical History:  Procedure Laterality Date  . Diagnostic Laparotomy    . planter facitis    . TUBAL LIGATION       OB History    Gravida  4   Para  3   Term  3   Preterm      AB      Living  3     SAB      TAB      Ectopic      Multiple      Live Births               Home Medications    Prior to Admission medications   Medication Sig Start Date End Date Taking? Authorizing Provider  aspirin-acetaminophen-caffeine (EXCEDRIN MIGRAINE) [phone] MG tablet Take 1 tablet by mouth every 6 (six) hours as needed for headache (Takes 1-2 times daily).    [provider]  azithromycin (ZITHROMAX) 250 MG tablet Take 250-500 mg by mouth See admin instructions. Starting on 07/22/2018 take 500mg  then take 250mg  on days 2 through 5 07/22/18   [provider]  clonazePAM (KLONOPIN) 1 MG tablet Take 0.5-1 mg by mouth 2 (two) times daily.    [provider]  cyclobenzaprine (FLEXERIL) 10 MG tablet Take 10 mg by mouth 3 (three) times daily as needed for muscle spasms. As needed 10/07/14   [provider]  docusate sodium (  COLACE) 100 MG capsule Take 100 mg by mouth daily as needed for mild constipation.    [provider]  ferrous sulfate 325 (65 FE) MG EC tablet Take 325 mg by mouth daily as needed (FOR SUPPLEMENT).    [provider]  FLUoxetine HCl 60 MG TABS Take 60 mg by mouth daily.    [provider]  fluticasone (FLONASE) 50 MCG/ACT nasal spray Place 2 sprays into both nostrils daily as needed for allergies or rhinitis.    [provider]  gabapentin (NEURONTIN) 300 MG capsule Take 300 mg by mouth 3 (three) times daily. 07/05/18   [provider]  HYDROcodone-acetaminophen (NORCO/VICODIN) 5-325 MG tablet Take 1 tablet by mouth every 6 (six) hours as needed for moderate pain. 07/29/18   Milton Ferguson, MD  ibuprofen (ADVIL,MOTRIN) 200 MG tablet Take 800 mg by mouth every 6 (six) hours as needed for  mild pain or moderate pain.    [provider]  lamoTRIgine (LAMICTAL) 100 MG tablet Take 100 mg by mouth 2 (two) times daily.     [provider]  lidocaine (XYLOCAINE) 2 % solution Use as directed 15 mLs in the mouth or throat as needed for mouth pain. 03/31/19   Mesner, Corene Cornea, MD  magnesium hydroxide (MILK OF MAGNESIA) 400 MG/5ML suspension Take 30 mLs by mouth daily as needed for mild constipation or moderate constipation.    [provider]  nicotine (NICODERM CQ - DOSED IN MG/24 HOURS) 21 mg/24hr patch Place 21 mg onto the skin daily.    [provider]  nystatin cream (MYCOSTATIN) Apply to affected area 2 times daily 02/04/19   Henderly, Britni A, PA-C  omeprazole (PRILOSEC) 40 MG capsule Take 40 mg by mouth at bedtime.     [provider]  ondansetron (ZOFRAN ODT) 4 MG disintegrating tablet 4mg  ODT q4 hours prn nausea/vomit 07/29/18   Milton Ferguson, MD  Oxycodone HCl 10 MG TABS Take 10 mg by mouth every 4 (four) hours as needed (FOR PAIN).    [provider]  phentermine (ADIPEX-P) 37.5 MG tablet Take 37.5 mg by mouth every morning.  06/13/18   [provider]    Family History Family History  Problem Relation Age of Onset  . Diabetes Maternal Grandmother   . Depression Mother   . Anxiety disorder Mother   . Alcohol abuse Father     Social History Social History   Tobacco Use  . Smoking status: Current Some Day Smoker    Packs/day: 1.00    Years: 10.00    Pack years: 10.00    Types: Cigarettes    Last attempt to quit: 04/17/2017    Years since quitting: 2.0  . Smokeless tobacco: Never Used  Substance Use Topics  . Alcohol use: Not Currently    Alcohol/week: 0.0 standard drinks  . Drug use: No     Allergies   Codeine, Phenergan [promethazine hcl], and Seroquel [quetiapine fumarate]   Review of Systems Review of Systems  Constitutional: Negative for appetite change, chills and fever.  HENT: Negative for  congestion, sore throat and trouble swallowing.   Respiratory: Negative for chest tightness and shortness of breath.   Cardiovascular: Negative for chest pain.  Gastrointestinal: Negative for abdominal pain, diarrhea, nausea and vomiting.  Genitourinary: Negative for difficulty urinating, dysuria and flank pain.  Musculoskeletal: Negative for myalgias and neck pain.  Skin: Negative for rash.  Psychiatric/Behavioral: Positive for self-injury and suicidal ideas. Negative for agitation, confusion and hallucinations.  Physical Exam Updated Vital Signs BP (!) 143/66 (BP Location: Right Arm)   Pulse 81   Temp 98.6 F (37 C)   Resp 20   Ht 5\' 2"  (1.575 m)   Wt (!) 138.3 kg   LMP 05/09/2019   SpO2 96%   BMI 55.79 kg/m   Physical Exam Vitals signs and nursing note reviewed.  Constitutional:      Appearance: Normal appearance. She is not toxic-appearing.     Comments: Patient is tearful  HENT:     Head: Atraumatic.     Mouth/Throat:     Mouth: Mucous membranes are moist.  Eyes:     Conjunctiva/sclera: Conjunctivae normal.     Pupils: Pupils are equal, round, and reactive to light.  Neck:     Musculoskeletal: Normal range of motion.  Cardiovascular:     Rate and Rhythm: Regular rhythm.     Pulses: Normal pulses.  Pulmonary:     Effort: Pulmonary effort is normal.     Breath sounds: Normal breath sounds.  Chest:     Chest wall: No tenderness.  Abdominal:     Palpations: Abdomen is soft.     Tenderness: There is no abdominal tenderness. There is no guarding.  Skin:    General: Skin is warm.     Capillary Refill: Capillary refill takes less than 2 seconds.     Comments: Several small scratches to the right distal wrist.  No bleeding.  No edema.  Neurological:     General: No focal deficit present.     Mental Status: She is alert and oriented to person, place, and time.     Sensory: No sensory deficit.     Motor: No weakness.  Psychiatric:        Mood and Affect:  Affect is tearful.        Speech: Speech normal.        Behavior: Behavior is cooperative.        Thought Content: Thought content includes suicidal ideation. Thought content does not include homicidal ideation. Thought content includes suicidal plan.        Cognition and Memory: Cognition normal.      ED Treatments / Results  Labs (all labs ordered are listed, but only abnormal results are displayed) Labs Reviewed  ACETAMINOPHEN LEVEL - Abnormal; Notable for the following components:      Result Value   Acetaminophen (Tylenol), Serum <10 (*)    All other components within normal limits  CBC - Abnormal; Notable for the following components:   WBC 15.2 (*)    RBC 5.44 (*)    MCH 24.8 (*)    Platelets 428 (*)    All other components within normal limits  RAPID URINE DRUG SCREEN, HOSP PERFORMED - Abnormal; Notable for the following components:   Opiates POSITIVE (*)    All other components within normal limits  URINALYSIS, ROUTINE W REFLEX MICROSCOPIC - Abnormal; Notable for the following components:   APPearance HAZY (*)    Hgb urine dipstick LARGE (*)    Leukocytes,Ua TRACE (*)    RBC / HPF >50 (*)    Bacteria, UA RARE (*)    All other components within normal limits  SARS CORONAVIRUS 2 BY RT PCR (HOSPITAL ORDER, [facility])  COMPREHENSIVE METABOLIC PANEL  ETHANOL  SALICYLATE LEVEL  POC URINE PREG, ED    EKG None  Radiology No results found.  Procedures Procedures (including critical care time)  Medications  Ordered in ED Medications  nicotine (NICODERM CQ - dosed in mg/24 hours) patch 21 mg (has no administration in time range)  oxyCODONE (Oxy IR/ROXICODONE) immediate release tablet 10 mg (has no administration in time range)     Initial Impression / Assessment and Plan / ED Course  I have reviewed the triage vital signs and the nursing notes.  Pertinent labs & imaging results that were available during my care of the patient were  reviewed by me and considered in my medical decision making (see chart for details).        Patient here with suicidal thoughts.  Has history of anxiety and depression.  Recent stressors at home and she states her medications are no longer working effectively.  There are several small scratches to her distal wrist that she states were self-inflicted with a razor blade.  Will obtain labs and consult TTS.  TTS consulted and recommends inpatient therapy.  Pt is voluntary at present.  Accepted at [facility].  Dr. Alcide Clever is accepting physician  Final Clinical Impressions(s) / ED Diagnoses   Final diagnoses:  None    ED Discharge Orders    None       Kem Parkinson, PA-C 05/12/19 1611    Kem Parkinson, PA-C 05/12/19 1615    Sherwood Gambler, MD 05/14/19 1600

## 2019-05-12 NOTE — ED Triage Notes (Signed)
Pt states she has been thinking of ways to try and commit suicide; pt states she feels this way because she has lost 2 family members this year and her husband has been having an affair and she states her youngest daughter told her she hated her and moved out of the house; pt states she used a razor blade to cut herself

## 2019-05-12 NOTE — ED Notes (Signed)
Attempt report to [facility]

## 2019-05-12 NOTE — Progress Notes (Signed)
Pt accepted to [facility]; [address] [place]    Dr. Alcide Clever is the accepting/attending provider.    Call report to [phone]  Michelle @ [facility] notified.     Pt is voluntary and will be transported by [facility]  Pt may arrive as soon as transportation is arranged.   Audree Camel, LCSW, [place] Disposition [facility]/TTS [phone]

## 2019-05-12 NOTE — ED Notes (Signed)
TTS in progress 

## 2019-10-17 ENCOUNTER — Emergency Department (HOSPITAL_COMMUNITY): Payer: Self-pay

## 2019-10-17 ENCOUNTER — Encounter (HOSPITAL_COMMUNITY): Payer: Self-pay | Admitting: Emergency Medicine

## 2019-10-17 ENCOUNTER — Emergency Department (HOSPITAL_COMMUNITY)
Admission: EM | Admit: 2019-10-17 | Discharge: 2019-10-17 | Disposition: A | Discharge status: Home / Self Care | Payer: Self-pay | Attending: Emergency Medicine | Admitting: Emergency Medicine

## 2019-10-17 ENCOUNTER — Other Ambulatory Visit: Payer: Self-pay

## 2019-10-17 DIAGNOSIS — F17210 Nicotine dependence, cigarettes, uncomplicated: Secondary | ICD-10-CM | POA: Insufficient documentation

## 2019-10-17 DIAGNOSIS — Z79899 Other long term (current) drug therapy: Secondary | ICD-10-CM | POA: Insufficient documentation

## 2019-10-17 DIAGNOSIS — R531 Weakness: Secondary | ICD-10-CM | POA: Insufficient documentation

## 2019-10-17 DIAGNOSIS — Z20822 Contact with and (suspected) exposure to covid-19: Secondary | ICD-10-CM | POA: Insufficient documentation

## 2019-10-17 DIAGNOSIS — R200 Anesthesia of skin: Secondary | ICD-10-CM | POA: Insufficient documentation

## 2019-10-17 DIAGNOSIS — R10819 Abdominal tenderness, unspecified site: Secondary | ICD-10-CM | POA: Insufficient documentation

## 2019-10-17 HISTORY — DX: Benign intracranial hypertension: G93.2

## 2019-10-17 LAB — ETHANOL: Alcohol, Ethyl (B): <10 mg/dL (ref <10)

## 2019-10-17 LAB — AMMONIA: Ammonia: 31 umol/L (ref 9–35)

## 2019-10-17 LAB — PROTIME-INR
INR: 1 (ref 0.8–1.2)
Prothrombin Time: 13.5 seconds (ref 11.4–15.2)

## 2019-10-17 LAB — COMPREHENSIVE METABOLIC PANEL
ALT: 29 U/L (ref 0–44)
AST: 31 U/L (ref 15–41)
Albumin: 3.7 g/dL (ref 3.5–5.0)
Alkaline Phosphatase: 78 U/L (ref 38–126)
Anion gap: 9 (ref 5–15)
BUN: 9 mg/dL (ref 6–20)
CO2: 24 mmol/L (ref 22–32)
Calcium: 8.8 mg/dL — ABNORMAL LOW (ref 8.9–10.3)
Chloride: 102 mmol/L (ref 98–111)
Creatinine, Ser: 0.54 mg/dL (ref 0.44–1.00)
GFR calc Af Amer: >60 mL/min (ref >60)
GFR calc non Af Amer: >60 mL/min (ref >60)
Glucose, Bld: 108 mg/dL — ABNORMAL HIGH (ref 70–99)
Potassium: 3.9 mmol/L (ref 3.5–5.1)
Sodium: 135 mmol/L (ref 135–145)
Total Bilirubin: 0.4 mg/dL (ref 0.3–1.2)
Total Protein: 7.5 g/dL (ref 6.5–8.1)

## 2019-10-17 LAB — MAGNESIUM: Magnesium: 2.1 mg/dL (ref 1.7–2.4)

## 2019-10-17 LAB — I-STAT BETA HCG BLOOD, ED (MC, WL, AP ONLY): I-stat hCG, quantitative: <5 m[IU]/mL (ref <5)

## 2019-10-17 LAB — CBC WITH DIFFERENTIAL/PLATELET
Abs Immature Granulocytes: 0.07 10*3/uL (ref 0.00–0.07)
Basophils Absolute: 0.1 10*3/uL (ref 0.0–0.1)
Basophils Relative: 1 %
Eosinophils Absolute: 0.1 10*3/uL (ref 0.0–0.5)
Eosinophils Relative: 1 %
HCT: 42.9 % (ref 36.0–46.0)
Hemoglobin: 13.7 g/dL (ref 12.0–15.0)
Immature Granulocytes: 0 %
Lymphocytes Relative: 19 %
Lymphs Abs: 3.2 10*3/uL (ref 0.7–4.0)
MCH: 25.8 pg — ABNORMAL LOW (ref 26.0–34.0)
MCHC: 31.9 g/dL (ref 30.0–36.0)
MCV: 80.9 fL (ref 80.0–100.0)
Monocytes Absolute: 0.7 10*3/uL (ref 0.1–1.0)
Monocytes Relative: 4 %
Neutro Abs: 12.4 10*3/uL — ABNORMAL HIGH (ref 1.7–7.7)
Neutrophils Relative %: 75 %
Platelets: 429 10*3/uL — ABNORMAL HIGH (ref 150–400)
RBC: 5.3 MIL/uL — ABNORMAL HIGH (ref 3.87–5.11)
RDW: 15 % (ref 11.5–15.5)
WBC: 16.5 10*3/uL — ABNORMAL HIGH (ref 4.0–10.5)
nRBC: 0 % (ref 0.0–0.2)

## 2019-10-17 LAB — LACTIC ACID, PLASMA: Lactic Acid, Venous: 1.2 mmol/L (ref 0.5–1.9)

## 2019-10-17 LAB — CK: Total CK: 33 U/L — ABNORMAL LOW (ref 38–234)

## 2019-10-17 LAB — CBG MONITORING, ED: Glucose-Capillary: 101 mg/dL — ABNORMAL HIGH (ref 70–99)

## 2019-10-17 LAB — POC SARS CORONAVIRUS 2 AG -  ED: SARS Coronavirus 2 Ag: NEGATIVE

## 2019-10-17 LAB — TSH: TSH: 0.769 u[IU]/mL (ref 0.350–4.500)

## 2019-10-17 MED ORDER — LACTATED RINGERS IV BOLUS
1000.0000 mL | Freq: Once | INTRAVENOUS | Status: AC
  Administered 2019-10-17: 1000 mL via INTRAVENOUS

## 2019-10-17 MED ORDER — LACTATED RINGERS IV BOLUS
500.0000 mL | Freq: Once | INTRAVENOUS | Status: AC
  Administered 2019-10-17: 500 mL via INTRAVENOUS

## 2019-10-17 MED ORDER — TIZANIDINE HCL 4 MG PO TABS
2.0000 mg | ORAL_TABLET | Freq: Once | ORAL | Status: AC
  Administered 2019-10-17: 2 mg via ORAL
  Filled 2019-10-17: qty 1

## 2019-10-17 MED ORDER — MORPHINE SULFATE ER 15 MG PO TBCR: 15.0000 mg | EXTENDED_RELEASE_TABLET | Freq: Two times a day (BID) | ORAL | Status: DC

## 2019-10-17 NOTE — Consult Note (Signed)
[facility] TeleNeurology Consult Services  Stat Consult  Date of Service:   10/17/2019 19:11:33  Impression:     .  R51 - HA (Headache)  Comments/Sign-Out: Headache management, consider diamox if appropriate  Pseudotumor cerebri? consider spinal tap, and Ventriculo peritoneal shunt if appropriate Outpatient sleep apnea evaluation for chronic headaches  CT HEAD: Showed No Acute Hemorrhage or Acute Core Infarct  Metrics: [facility] Notification Time: 10/17/2019 19:09:09 Stamp Time: 10/17/2019 19:11:33 Callback Response Time: 10/17/2019 19:11:57  Our recommendations are outlined below.   Disposition: Sign Off  Sign Out:     .  Discussed with Emergency Department Provider  ----------------------------------------------------------------------------------------------------  Chief Complaint: headache  History of Present Illness: Patient is a 44 year old Female.  45 yo F h/o fibromyalgia, sciatica pain on multiple pain medications p/w headache for 6 months, progressively worsening, difficulty waking up today, neck pain,. Pt uses walker and buggy chair due to sciatic pain. Pt c/o numbness Left foot for months or longer. Pt has had these symptoms for some time. 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 Initial exam in ED - patient unable to move all extremities, now she is able to move all extremities with no limitation.       Examination: BP(unknown), Pulse(unknown), Blood Glucose(unknown)  Neuro Exam:  General: Alert,Awake  Speech: Fluent:  Language: Intact:  Face: Symmetric:  Facial Sensation:  Visual Fields:  Extraocular Movements: Intact:  Motor Exam: No Drift:  Sensation: Intact:  Coordination: Intact:  decreased range of motion with head turn to right, no significant pain   Patient/Family was informed the Neurology Consult would occur via TeleHealth consult by way of interactive audio and video telecommunications  and consented to receiving care in this manner.  Due to the immediate potential for life-threatening deterioration due to underlying acute neurologic illness, I spent 30 minutes providing critical care. This time includes time for face to face visit via telemedicine, review of medical records, imaging studies and discussion of findings with providers, the patient and/or family.   Dr Launa Grill   [facility] [phone]  Case [id number]

## 2019-10-17 NOTE — ED Provider Notes (Signed)
[facility] Provider Note   CSN: [id number] Arrival date & time: 10/17/19  1521     History Chief Complaint  Patient presents with  . Numbness    Angela Massey is a 44 y.o. female with a past medical history of fibromyalgia, pseudotumor cerebri, PCOS, hypertension, depression, chronic pain in her back, status post bilateral tubal ligation, who presents today for evaluation of multiple complaints. Her primary concern today is body wide weakness.  She reports that as far as she remembers she felt normal when she went to bed last night.  She states that she woke up at about 9 and felt generally weak.  She took her chronic pain medicine, with out relief.  She denies any medication changes.  She reports body wide pain.  She has had a recent diet change.  About 4 days ago she started a diet in to help with gastric bypass surgery.  She reports no fevers, cough, or shortness of breath.  She states that her weakness has been worsening throughout the day.  She denies any history of similar.  She reports that she feels sleepy and is having difficulty staying awake.  She notes that she did not sleep well last night due to insomnia however it was not outside of her usual.  She denies any trauma.  She reports she feels like she has something pressing her entire body down onto the bed.  She was able to walk to the ambulance however required significant assistance.  She has also taken her muscle relaxers today without relief.  She has recently been seen at [facility] for eye issues and has concern for papilledema.  They state that there was discussion about needing a LP to drain CSF.  No tick bites recently.   She denies any new medications.  She reports headache, she feels like it is "all over" and is unable to otherwise differentiate.  No acute vision changes.  She denies any trauma.  No specific abdominal pain, nausea, or vomiting.  She reports worsened body wide pain in addition  to weakness.  She feels like she has a weight pushing down on her pushing her into the bed.  She states she "just doesn't feel right."      HPI     Past Medical History:  Diagnosis Date  . Anxiety   . Arthritis    back and ankles  . Depression   . Fatty liver   . Fibromyalgia   . Hypertension   . Hypertriglyceridemia   . Ovarian abscess   . PCOS (polycystic ovarian syndrome)   . Pseudotumor   . Sciatica     Patient Active Problem List   Diagnosis Date Noted  . MDD (major depressive disorder), recurrent episode, moderate ([place]) 10/11/2015  . GAD (generalized anxiety disorder) 10/11/2015  . Panic disorder without agoraphobia 10/11/2015  . Insomnia 10/11/2015  . Hyperprolactinemia ([place]) 05/26/2013  . Depression, major 05/26/2013    Past Surgical History:  Procedure Laterality Date  . Diagnostic Laparotomy    . planter facitis    . TUBAL LIGATION       OB History    Gravida  4   Para  3   Term  3   Preterm      AB      Living  3     SAB      TAB      Ectopic      Multiple  Live Births              Family History  Problem Relation Age of Onset  . Diabetes Maternal Grandmother   . Depression Mother   . Anxiety disorder Mother   . Alcohol abuse Father     Social History   Tobacco Use  . Smoking status: Current Some Day Smoker    Packs/day: 1.00    Years: 10.00    Pack years: 10.00    Types: Cigarettes    Last attempt to quit: 04/17/2017    Years since quitting: 2.5  . Smokeless tobacco: Never Used  Substance Use Topics  . Alcohol use: Not Currently    Alcohol/week: 0.0 standard drinks  . Drug use: No    Home Medications Prior to Admission medications   Medication Sig Start Date End Date Taking? Authorizing Provider  buPROPion (WELLBUTRIN SR) 150 MG 12 hr tablet Take 150 mg by mouth 2 (two) times daily.   Yes [provider]  busPIRone (BUSPAR) 5 MG tablet Take 5 mg by mouth 3 (three) times daily. 09/30/19  Yes  [provider]  DULoxetine (CYMBALTA) 60 MG capsule Take 120 mg by mouth at bedtime. 10/06/19  Yes [provider]  fluticasone (FLONASE) 50 MCG/ACT nasal spray Place 2 sprays into both nostrils daily as needed for allergies or rhinitis.   Yes [provider]  gabapentin (NEURONTIN) 300 MG capsule Take 300 mg by mouth 4 (four) times daily.  07/05/18  Yes [provider]  hydrOXYzine (ATARAX/VISTARIL) 25 MG tablet Take 25 mg by mouth 3 (three) times daily. 09/24/19  Yes [provider]  ibuprofen (ADVIL,MOTRIN) 200 MG tablet Take 800 mg by mouth every 6 (six) hours as needed for mild pain or moderate pain.   Yes [provider]  lamoTRIgine (LAMICTAL) 100 MG tablet Take 100 mg by mouth 2 (two) times daily.    Yes [provider]  morphine (MS CONTIN) 15 MG 12 hr tablet Take 1 tablet by mouth every 12 (twelve) hours. 10/01/19  Yes [provider]  Oxycodone HCl 10 MG TABS Take 10 mg by mouth every 4 (four) hours.    Yes [provider]  propranolol (INDERAL) 10 MG tablet Take 10 mg by mouth daily. 10/05/19  Yes [provider]  tiZANidine (ZANAFLEX) 2 MG tablet Take 2 mg by mouth 4 (four) times daily. 10/16/19  Yes [provider]  traZODone (DESYREL) 50 MG tablet Take 50 mg by mouth at bedtime. 09/30/19  Yes [provider]    Allergies    Codeine, Phenergan [promethazine hcl], and Seroquel [quetiapine fumarate]  Review of Systems   Review of Systems  Constitutional: Positive for fatigue. Negative for chills and fever.  HENT: Negative for congestion.   Eyes: Positive for photophobia and pain. Negative for visual disturbance.  Respiratory: Negative for cough, chest tightness and shortness of breath.   Cardiovascular: Negative for chest pain.  Gastrointestinal: Negative for abdominal pain.  Genitourinary: Negative for dysuria.  Musculoskeletal: Positive for arthralgias, back pain, myalgias and  neck pain.  Skin: Negative for color change, pallor and rash.  Neurological: Positive for speech difficulty, weakness and headaches.  Psychiatric/Behavioral: Positive for sleep disturbance. Negative for confusion. The patient is not nervous/anxious.   All other systems reviewed and are negative.   Physical Exam Updated Vital Signs BP (!) 144/75   Pulse 81   Temp 98.5 F (36.9 C) (Oral)   Resp (!) 21   Ht 5'  2" (1.575 m)   Wt (!) 140.6 kg   LMP 10/03/2019   SpO2 97%   BMI 56.70 kg/m   Physical Exam Vitals and nursing note reviewed.  Constitutional:      Appearance: She is well-developed. She is obese. She is ill-appearing.  HENT:     Head: Normocephalic and atraumatic.  Eyes:     General: No scleral icterus.       Right eye: No discharge.        Left eye: No discharge.     Conjunctiva/sclera: Conjunctivae normal.  Cardiovascular:     Rate and Rhythm: Normal rate and regular rhythm.     Pulses: Normal pulses.     Heart sounds: Normal heart sounds.  Pulmonary:     Effort: Pulmonary effort is normal. No respiratory distress.     Breath sounds: Normal breath sounds. No stridor.  Abdominal:     General: There is no distension.     Tenderness: There is abdominal tenderness (diffuse TTP consistent with TTP over extremities. Non localized).  Musculoskeletal:        General: No deformity.     Cervical back: No rigidity.     Comments: Patient has diffuse body wide pain in her bilateral arms and legs.  She does not have any deformities.  Exam is limited by patient poor tolerance.    Skin:    General: Skin is warm and dry.  Neurological:     Mental Status: She is alert.     Motor: No abnormal muscle tone.     Comments: Patient is awake, and alert.  She is oriented to person, place, time, and events prior to her arrival.  She is unable to lift her bilateral legs off the bed with inability to lift her upper arms.  Grip strength and ankle dorsiflexion and plantar flexion are 4/5.   Facial movements are symmetrical.  Speech is not slurred. Pupils PEARL.   She reports inability to feel her left leg and decreased sensation in her right leg but when I palpate her left leg she reacts in pain. Unable to test coordination due to weakness.   Psychiatric:     Comments: Unable to evaluate mood.      ED Results / Procedures / Treatments   Labs (all labs ordered are listed, but only abnormal results are displayed) Labs Reviewed  CBC WITH DIFFERENTIAL/PLATELET - Abnormal; Notable for the following components:      Result Value   WBC 16.5 (*)    RBC 5.30 (*)    MCH 25.8 (*)    Platelets 429 (*)    Neutro Abs 12.4 (*)    All other components within normal limits  COMPREHENSIVE METABOLIC PANEL - Abnormal; Notable for the following components:   Glucose, Bld 108 (*)    Calcium 8.8 (*)    All other components within normal limits  CK - Abnormal; Notable for the following components:   Total CK 33 (*)    All other components within normal limits  CBG MONITORING, ED - Abnormal; Notable for the following components:   Glucose-Capillary 101 (*)    All other components within normal limits  LACTIC ACID, PLASMA  ETHANOL  AMMONIA  PROTIME-INR  MAGNESIUM  TSH  URINALYSIS, ROUTINE W REFLEX MICROSCOPIC  RAPID URINE DRUG SCREEN, HOSP PERFORMED  I-STAT BETA HCG BLOOD, ED ([facility], [facility], [facility] ONLY)  POC SARS CORONAVIRUS 2 AG -  ED    EKG EKG Interpretation  Date/Time:  Saturday  October 17 2019 17:03:46 EDT Ventricular Rate:  76 PR Interval:    QRS Duration: 90 QT Interval:  393 QTC Calculation: 442 R Axis:   65 Text Interpretation: Sinus rhythm Low voltage, precordial leads No significant change since last tracing Confirmed by Fredia Sorrow [phone]) on 10/17/2019 5:13:53 PM   Radiology DG Chest 1 View  Result Date: 10/17/2019 CLINICAL DATA:  Low back pain and bilateral leg numbness. Smoker. EXAM: CHEST  1 VIEW COMPARISON:  12/16/2017 FINDINGS: Borderline enlarged cardiac  silhouette. Clear lungs with normal vascularity. Stable mild peribronchial thickening. Unremarkable bones. IMPRESSION: No acute abnormality. Stable mild chronic bronchitic changes. Electronically Signed   By: Claudie Revering M.D.   On: 10/17/2019 17:31   CT Head Wo Contrast  Result Date: 10/17/2019 CLINICAL DATA:  Benign intracranial hypertension. Awoke with weakness and entire body. Head and neck pain. Lethargy. EXAM: CT HEAD WITHOUT CONTRAST TECHNIQUE: Contiguous axial images were obtained from the base of the skull through the vertex without intravenous contrast. COMPARISON:  Head CT 03/31/2019 FINDINGS: Brain: No intracranial hemorrhage, mass effect, or midline shift. No hydrocephalus. The basilar cisterns are patent. No evidence of empty sella. No evidence of territorial infarct or acute ischemia. No extra-axial or intracranial fluid collection. Vascular: No hyperdense vessel. Skull: No fracture or focal lesion. Sinuses/Orbits: There is opacification of the right maxillary sinus and appears new from prior exam. Stable punctate calcification of the right optic disc. Mastoid air cells are clear. Other: None. IMPRESSION: 1. No acute intracranial abnormality. 2. Right maxillary sinus opacification, new from September 2020 may represent a mucous retention cyst or sinusitis. Electronically Signed   By: Keith Rake M.D.   On: 10/17/2019 18:06   CT Cervical Spine Wo Contrast  Result Date: 10/17/2019 CLINICAL DATA:  Neuro deficit, acute, stroke suspected Pain in neck, head, body weakness EXAM: CT CERVICAL SPINE WITHOUT CONTRAST TECHNIQUE: Multidetector CT imaging of the cervical spine was performed without intravenous contrast. Multiplanar CT image reconstructions were also generated. COMPARISON:  No prior cervical spine imaging. FINDINGS: Alignment: Straightening of normal lordosis. No listhesis or subluxation. Skull base and vertebrae: No acute fracture. No primary bone lesion or focal pathologic process.  Soft tissues and spinal canal: No prevertebral fluid or swelling. No visible canal hematoma. Disc levels: Disc space narrowing and endplate spurring at [id number], C6-C7. Facet hypertrophy at this level. Posterior disc osteophyte complex at C5-C6 and C6-C7 causes narrowing of the spinal canal. Upper chest: No acute findings. Other: None. IMPRESSION: 1. No fracture or subluxation of the cervical spine. 2. 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 Posterior disc osteophyte complex at C5-C6 and C6-C7 causes narrowing of the spinal canal. Electronically Signed   By: Keith Rake M.D.   On: 10/17/2019 18:10    Procedures Procedures (including critical care time)  Medications Ordered in ED Medications  morphine (MS CONTIN) 12 hr tablet 15 mg (has no administration in time range)  lactated ringers bolus 500 mL (0 mLs Intravenous Stopped 10/17/19 1858)  lactated ringers bolus 1,000 mL (0 mLs Intravenous Stopped 10/17/19 2114)  tiZANidine (ZANAFLEX) tablet 2 mg (2 mg Oral Given 10/17/19 1955)    ED Course  I have reviewed the triage vital signs and the nursing notes.  Pertinent labs & imaging results that were available during my care of the patient were reviewed by me and considered in my medical decision making (see chart for details).  Clinical Course as of Oct 17 148  Sat Oct 17, 2019  1806 Appears  consistent with baseline on previous labs.   WBC(!): 16.5 [EH]  1846 Patient is reevaluated.  Her strength in her arms and legs is improved, she is able to move them now without significant pain.  She has not yet had to urinate.  She still reports significant pain in her head.     [EH]  1943 Telemetry neurology has seen patient, recommends treatment for migraine and reassessment.   [EH]    Clinical Course User Index [EH] Ollen Gross   MDM Rules/Calculators/A&P                     Patient is a 44 year old woman who presents today primarily for evaluation of weakness in her entire  body and feelings of fatigue.  She has been partially evaluated for benign intracranial hypertension.  Labs are obtained and reviewed, CBC shows leukocytosis however this appears consistent with her baseline on previous labs, however otherwise CBC and CMP without evidence of significant acute hematologic or electrolyte derangements.  PT/INR is not elevated.  Lactic acid not elevated at 1.2.  Pregnancy test is negative.  TSH, ammonia, magnesium, CK are all unremarkable.  Covid antigen test is negative.  CT head and neck were obtained without evidence of acute significant abnormalities.  Patient was treated with IV fluids while in the emergency room and had spontaneous improvement in her weakness.  Telemetry neurology was consulted, please see their note for further details.  Patient is given her home pain meds and Zanaflex.  She has allergy to Phenergan therefore typical migraine cocktail not given.  UA ordered however patient reported she was unable to urinate into cup and went into toilet instead.  She does not have concerns for UTI.   After 6 hours in the emergency room patient reported that her pain and weakness had resolved and she wished for discharge home.  Recommended outpatient neurology follow-up.  No clear cause found for her symptoms today, however given spontaneous resolution with only her home medicines and IV fluids in the absence of significant abnormalities on CT head or neck low suspicion for serious life threatening cause.   This patient was discussed with Dr. Rogene Houston.   Return precautions were discussed with patient who states their understanding.  At the time of discharge patient denied any unaddressed complaints or concerns.  Patient is agreeable for discharge home.  Note: Portions of this report may have been transcribed using voice recognition software. Every effort was made to ensure accuracy; however, inadvertent computerized transcription errors may be present  Final Clinical  Impression(s) / ED Diagnoses Final diagnoses:  Weakness    Rx / DC Orders ED Discharge Orders    None       Lorin Glass, PA-C 10/18/19 0151    Fredia Sorrow, MD 10/26/19 [phone]

## 2019-10-17 NOTE — Discharge Instructions (Signed)
Today you received medications that may make you sleepy or impair your ability to make decisions.  For the next 24 hours please do not drive, operate heavy machinery, care for a small child with out another adult present, or perform any activities that may cause harm to you or someone else if you were to fall asleep or be impaired.   

## 2019-10-17 NOTE — ED Triage Notes (Addendum)
Patient brought in via EMS. Per EMS patient was c/o lower back pain and bilateral pain numbness. Patient states she is here in ED because she is lethargic and has numbness to her whole body and is unable to move her lower body. Per patient weakness all over.  Patient states "I feel like I have been drugged." Patient denies any opportunity for someone to drug her. Patient took oxycodone 10mg  and morphine 15mg  this morning at approx9 am which she states is what she takes daily. Patient able to ambulate per paramedic when transferring her to truck but patient unable to ambulate from wheelchair to bed and yelling out with any movement. Patient c/o headache that has progressively gotten worse today.

## 2019-10-17 NOTE — ED Notes (Signed)
Pt ambulated to bathroom but was unable to "position herself right to pee in the cup we provided". Will attempt to collect urine specimen the next time pt goes to bathroom.

## 2019-10-31 HISTORY — PX: LAPAROSCOPIC GASTRIC SLEEVE RESECTION: SHX5895

## 2019-11-03 ENCOUNTER — Other Ambulatory Visit: Payer: Self-pay

## 2019-11-03 ENCOUNTER — Ambulatory Visit: Payer: Medicaid Other | Attending: Internal Medicine

## 2019-11-03 DIAGNOSIS — Z20822 Contact with and (suspected) exposure to covid-19: Secondary | ICD-10-CM

## 2019-11-04 LAB — SARS-COV-2, NAA 2 DAY TAT

## 2019-11-04 LAB — NOVEL CORONAVIRUS, NAA: SARS-CoV-2, NAA: NOT DETECTED

## 2019-11-05 ENCOUNTER — Telehealth: Payer: Self-pay | Admitting: *Deleted

## 2019-11-05 NOTE — Telephone Encounter (Signed)
Patient is calling for COVID test result- notified negative- patient needs copy for surgery- she is having problem accessing her MyChart-. Help desk advised and [employer] number given.

## 2019-12-07 ENCOUNTER — Other Ambulatory Visit: Payer: Self-pay

## 2019-12-07 ENCOUNTER — Emergency Department (HOSPITAL_COMMUNITY)
Admission: EM | Admit: 2019-12-07 | Discharge: 2019-12-08 | Disposition: A | Discharge status: Home / Self Care | Payer: Medicaid Other | Attending: Emergency Medicine | Admitting: Emergency Medicine

## 2019-12-07 ENCOUNTER — Encounter (HOSPITAL_COMMUNITY): Payer: Self-pay | Admitting: Emergency Medicine

## 2019-12-07 DIAGNOSIS — I10 Essential (primary) hypertension: Secondary | ICD-10-CM | POA: Insufficient documentation

## 2019-12-07 DIAGNOSIS — F17210 Nicotine dependence, cigarettes, uncomplicated: Secondary | ICD-10-CM | POA: Insufficient documentation

## 2019-12-07 DIAGNOSIS — N390 Urinary tract infection, site not specified: Secondary | ICD-10-CM

## 2019-12-07 DIAGNOSIS — R109 Unspecified abdominal pain: Secondary | ICD-10-CM | POA: Insufficient documentation

## 2019-12-07 NOTE — ED Triage Notes (Signed)
Patient states urinary retention x several hours. Patient states that she talked to the nurses line and that the nurses line told her she was going into shock. Unable to get a lot of information form the patient.

## 2019-12-07 NOTE — ED Notes (Signed)
Bladder scan completed on patient an only 19 ml's showing on the bladder scanner. Patient had gastric sleeve surgery completed on May 11 th.

## 2019-12-08 LAB — BASIC METABOLIC PANEL
Anion gap: 13 (ref 5–15)
BUN: 10 mg/dL (ref 6–20)
CO2: 24 mmol/L (ref 22–32)
Calcium: 8.9 mg/dL (ref 8.9–10.3)
Chloride: 99 mmol/L (ref 98–111)
Creatinine, Ser: 0.63 mg/dL (ref 0.44–1.00)
GFR calc Af Amer: >60 mL/min (ref >60)
GFR calc non Af Amer: >60 mL/min (ref >60)
Glucose, Bld: 99 mg/dL (ref 70–99)
Potassium: 3.6 mmol/L (ref 3.5–5.1)
Sodium: 136 mmol/L (ref 135–145)

## 2019-12-08 LAB — URINALYSIS, ROUTINE W REFLEX MICROSCOPIC
Bilirubin Urine: NEGATIVE
Glucose, UA: NEGATIVE mg/dL
Ketones, ur: 5 mg/dL — AB
Nitrite: NEGATIVE
Protein, ur: 100 mg/dL — AB
RBC / HPF: >50 RBC/hpf — ABNORMAL HIGH (ref 0–5)
Specific Gravity, Urine: 1.01 (ref 1.005–1.030)
WBC, UA: >50 WBC/hpf — ABNORMAL HIGH (ref 0–5)
pH: 6 (ref 5.0–8.0)

## 2019-12-08 LAB — CBC WITH DIFFERENTIAL/PLATELET
Abs Immature Granulocytes: 0.1 10*3/uL — ABNORMAL HIGH (ref 0.00–0.07)
Basophils Absolute: 0.1 10*3/uL (ref 0.0–0.1)
Basophils Relative: 1 %
Eosinophils Absolute: 0.4 10*3/uL (ref 0.0–0.5)
Eosinophils Relative: 2 %
HCT: 42.3 % (ref 36.0–46.0)
Hemoglobin: 13.5 g/dL (ref 12.0–15.0)
Immature Granulocytes: 1 %
Lymphocytes Relative: 14 %
Lymphs Abs: 2.8 10*3/uL (ref 0.7–4.0)
MCH: 25.9 pg — ABNORMAL LOW (ref 26.0–34.0)
MCHC: 31.9 g/dL (ref 30.0–36.0)
MCV: 81.2 fL (ref 80.0–100.0)
Monocytes Absolute: 0.9 10*3/uL (ref 0.1–1.0)
Monocytes Relative: 5 %
Neutro Abs: 16 10*3/uL — ABNORMAL HIGH (ref 1.7–7.7)
Neutrophils Relative %: 77 %
Platelets: 352 10*3/uL (ref 150–400)
RBC: 5.21 MIL/uL — ABNORMAL HIGH (ref 3.87–5.11)
RDW: 15.8 % — ABNORMAL HIGH (ref 11.5–15.5)
WBC: 20.2 10*3/uL — ABNORMAL HIGH (ref 4.0–10.5)
nRBC: 0 % (ref 0.0–0.2)

## 2019-12-08 LAB — PREGNANCY, URINE: Preg Test, Ur: NEGATIVE

## 2019-12-08 MED ORDER — SODIUM CHLORIDE 0.9 % IV SOLN
1.0000 g | Freq: Once | INTRAVENOUS | Status: AC
  Administered 2019-12-08: 1 g via INTRAVENOUS
  Filled 2019-12-08: qty 10

## 2019-12-08 MED ORDER — SODIUM CHLORIDE 0.9 % IV BOLUS
1000.0000 mL | Freq: Once | INTRAVENOUS | Status: AC
  Administered 2019-12-08: 1000 mL via INTRAVENOUS

## 2019-12-08 MED ORDER — CEPHALEXIN 500 MG PO CAPS: 500.0000 mg | ORAL_CAPSULE | Freq: Three times a day (TID) | ORAL | 0 refills | Status: DC

## 2019-12-08 MED ORDER — LORAZEPAM 2 MG/ML IJ SOLN
1.0000 mg | Freq: Once | INTRAMUSCULAR | Status: AC
  Administered 2019-12-08: 1 mg via INTRAVENOUS
  Filled 2019-12-08: qty 1

## 2019-12-08 NOTE — Discharge Instructions (Addendum)
Should be readYou were seen today for difficulty urinating.  You appear to have a urinary tract infection.  Take medications as prescribed.  Make sure that you are staying hydrated.  If you develop fevers, significant back pain, nausea, vomiting evaluated.

## 2019-12-08 NOTE — ED Provider Notes (Signed)
[facility] EMERGENCY DEPARTMENT Provider Note   CSN: [id number] Arrival date & time: 12/07/19  2240     History Chief Complaint  Patient presents with  . Urinary Retention    Angela Massey is a 44 y.o. female.  HPI     This is a 44 year old female with a history of hypertension, PCOS, anxiety, recent gastric sleeve who presents with urinary retention.  Patient reports she has been unable to urinate since around noon yesterday.  She states that she has only been able to "dribble."  She reports abdominal pressure and pain.  Denies any back pain or fevers.  Patient is very difficult to obtain a history from as she is very anxious appearing and tearful.  She denies any fevers.  No known history of urinary retention.  Patient reports recent history of issues with dehydration related to her recent gastric sleeve.  Past Medical History:  Diagnosis Date  . Anxiety   . Arthritis    back and ankles  . Depression   . Fatty liver   . Fibromyalgia   . Hypertension   . Hypertriglyceridemia   . Ovarian abscess   . PCOS (polycystic ovarian syndrome)   . Pseudotumor   . Sciatica     Patient Active Problem List   Diagnosis Date Noted  . MDD (major depressive disorder), recurrent episode, moderate ([place]) 10/11/2015  . GAD (generalized anxiety disorder) 10/11/2015  . Panic disorder without agoraphobia 10/11/2015  . Insomnia 10/11/2015  . Hyperprolactinemia ([place]) 05/26/2013  . Depression, major 05/26/2013    Past Surgical History:  Procedure Laterality Date  . Diagnostic Laparotomy    . planter facitis    . TUBAL LIGATION       OB History    Gravida  4   Para  3   Term  3   Preterm      AB      Living  3     SAB      TAB      Ectopic      Multiple      Live Births              Family History  Problem Relation Age of Onset  . Diabetes Maternal Grandmother   . Depression Mother   . Anxiety disorder Mother   . Alcohol abuse Father     Social History    Tobacco Use  . Smoking status: Current Some Day Smoker    Packs/day: 1.00    Years: 10.00    Pack years: 10.00    Types: Cigarettes    Last attempt to quit: 04/17/2017    Years since quitting: 2.6  . Smokeless tobacco: Never Used  Substance Use Topics  . Alcohol use: Not Currently    Alcohol/week: 0.0 standard drinks  . Drug use: No    Home Medications Prior to Admission medications   Medication Sig Start Date End Date Taking? Authorizing Provider  buPROPion (WELLBUTRIN SR) 150 MG 12 hr tablet Take 150 mg by mouth 2 (two) times daily.    [provider]  busPIRone (BUSPAR) 5 MG tablet Take 5 mg by mouth 3 (three) times daily. 09/30/19   [provider]  cephALEXin (KEFLEX) 500 MG capsule Take 1 capsule (500 mg total) by mouth 3 (three) times daily. 12/08/19   Ladd Cen, Barbette Hair, MD  DULoxetine (CYMBALTA) 60 MG capsule Take 120 mg by mouth at bedtime. 10/06/19   [provider]  fluticasone (FLONASE) 50  MCG/ACT nasal spray Place 2 sprays into both nostrils daily as needed for allergies or rhinitis.    [provider]  gabapentin (NEURONTIN) 300 MG capsule Take 300 mg by mouth 4 (four) times daily.  07/05/18   [provider]  hydrOXYzine (ATARAX/VISTARIL) 25 MG tablet Take 25 mg by mouth 3 (three) times daily. 09/24/19   [provider]  ibuprofen (ADVIL,MOTRIN) 200 MG tablet Take 800 mg by mouth every 6 (six) hours as needed for mild pain or moderate pain.    [provider]  lamoTRIgine (LAMICTAL) 100 MG tablet Take 100 mg by mouth 2 (two) times daily.     [provider]  morphine (MS CONTIN) 15 MG 12 hr tablet Take 1 tablet by mouth every 12 (twelve) hours. 10/01/19   [provider]  Oxycodone HCl 10 MG TABS Take 10 mg by mouth every 4 (four) hours.     [provider]  propranolol (INDERAL) 10 MG tablet Take 10 mg by mouth daily. 10/05/19   [provider]  tiZANidine (ZANAFLEX) 2 MG tablet  Take 2 mg by mouth 4 (four) times daily. 10/16/19   [provider]  traZODone (DESYREL) 50 MG tablet Take 50 mg by mouth at bedtime. 09/30/19   [provider]    Allergies    Codeine, Phenergan [promethazine hcl], and Seroquel [quetiapine fumarate]  Review of Systems   Review of Systems  Constitutional: Negative for fever.  Respiratory: Negative for shortness of breath.   Cardiovascular: Negative for chest pain.  Gastrointestinal: Positive for nausea. Negative for abdominal pain and vomiting.  Genitourinary: Positive for difficulty urinating and urgency.  Psychiatric/Behavioral: The patient is nervous/anxious.   All other systems reviewed and are negative.   Physical Exam Updated Vital Signs BP 113/79   Pulse 73   Temp 97.7 F (36.5 C) (Oral)   Resp (!) 24   Ht 1.575 m (5\' 2" )   LMP  (LMP Unknown)   SpO2 95%   BMI 56.70 kg/m   Physical Exam Vitals and nursing note reviewed.  Constitutional:      Appearance: She is well-developed.     Comments: Morbidly obese, anxious appearing, difficult to direct  HENT:     Head: Normocephalic and atraumatic.     Nose: Nose normal.     Mouth/Throat:     Mouth: Mucous membranes are moist.  Eyes:     Pupils: Pupils are equal, round, and reactive to light.  Cardiovascular:     Rate and Rhythm: Normal rate and regular rhythm.     Heart sounds: Normal heart sounds.  Pulmonary:     Effort: Pulmonary effort is normal. No respiratory distress.     Breath sounds: No wheezing.  Abdominal:     General: Bowel sounds are normal.     Palpations: Abdomen is soft.     Tenderness: There is no abdominal tenderness. There is no guarding or rebound.  Musculoskeletal:     Cervical back: Neck supple.     Right lower leg: No edema.     Left lower leg: No edema.  Skin:    General: Skin is warm and dry.  Neurological:     Mental Status: She is alert and oriented to person, place, and time.  Psychiatric:        Mood and Affect:  Mood normal.     ED Results / Procedures / Treatments   Labs (all labs ordered are listed, but only abnormal results are displayed)  Labs Reviewed  URINALYSIS, ROUTINE W REFLEX MICROSCOPIC - Abnormal; Notable for the following components:      Result Value   APPearance CLOUDY (*)    Hgb urine dipstick LARGE (*)    Ketones, ur 5 (*)    Protein, ur 100 (*)    Leukocytes,Ua MODERATE (*)    RBC / HPF >50 (*)    WBC, UA >50 (*)    Bacteria, UA RARE (*)    Non Squamous Epithelial 0-5 (*)    All other components within normal limits  CBC WITH DIFFERENTIAL/PLATELET - Abnormal; Notable for the following components:   WBC 20.2 (*)    RBC 5.21 (*)    MCH 25.9 (*)    RDW 15.8 (*)    Neutro Abs 16.0 (*)    Abs Immature Granulocytes 0.10 (*)    All other components within normal limits  URINE CULTURE  BASIC METABOLIC PANEL  PREGNANCY, URINE    EKG None  Radiology No results found.  Procedures Procedures (including critical care time)  Medications Ordered in ED Medications  sodium chloride 0.9 % bolus 1,000 mL (1,000 mLs Intravenous New Bag/Given 12/08/19 0047)  LORazepam (ATIVAN) injection 1 mg (1 mg Intravenous Given 12/08/19 0047)  cefTRIAXone (ROCEPHIN) 1 g in sodium chloride 0.9 % 100 mL IVPB (1 g Intravenous New Bag/Given 12/08/19 0108)    ED Course  I have reviewed the triage vital signs and the nursing notes.  Pertinent labs & imaging results that were available during my care of the patient were reviewed by me and considered in my medical decision making (see chart for details).    MDM Rules/Calculators/A&P                       Patient presents with reported urinary retention.  She is very anxious on exam and provides limited history because of this.  She is obese but otherwise nontoxic.  Afebrile.  Vital signs largely reassuring.  She has a nontender abdomen.  Bedside bladder scan with minimal amount of urine noted.  Requested in and out cath to get a true volume.  In  and out cath with less than 50 cc.  Patient given fluids.  Urinalysis with greater than 50 white cells, bacteria, and white blood cell clumps.  Suspect she has an acute UTI causing her symptoms.  She has a leukocytosis to 20 but has a history of leukocytosis in the 16-17 range.  Question whether this is an acute phase reaction versus related to infection.  Given onset of symptoms yesterday, would have lower suspicion for upper UTI especially without CVA tenderness or fevers.  Patient was given IV Rocephin.  Will discharge with Keflex.  Patient reassured.  After history, exam, and medical workup I feel the patient has been appropriately medically screened and is safe for discharge home. Pertinent diagnoses were discussed with the patient. Patient was given return precautions.   Final Clinical Impression(s) / ED Diagnoses Final diagnoses:  Lower urinary tract infectious disease    Rx / DC Orders ED Discharge Orders         Ordered    cephALEXin (KEFLEX) 500 MG capsule  3 times daily     12/08/19 0132           Alyia Lacerte, Barbette Hair, MD 12/08/19 [id number]

## 2019-12-10 LAB — URINE CULTURE: Culture: 20000 — AB

## 2019-12-11 ENCOUNTER — Telehealth: Payer: Self-pay | Admitting: Emergency Medicine

## 2019-12-11 NOTE — Telephone Encounter (Signed)
Post ED Visit - Positive Culture Follow-up  Culture report reviewed by antimicrobial stewardship pharmacist: [place] Team []  Elenor Quinones, Pharm.D. []  Heide Guile, Pharm.D., BCPS AQ-ID []  Parks Neptune, Pharm.D., BCPS []  Alycia Rossetti, Pharm.D., BCPS []  [place], Pharm.D., BCPS, AAHIVP []  Legrand Como, Pharm.D., BCPS, AAHIVP []  Salome Arnt, PharmD, BCPS []  Johnnette Gourd, PharmD, BCPS []  Hughes Better, PharmD, BCPS [x]  Lorel Monaco, PharmD []  Laqueta Linden, PharmD, BCPS []  Albertina Parr, PharmD  [place] Team []  Leodis Sias, PharmD []  Lindell Spar, PharmD []  Royetta Asal, PharmD []  Graylin Shiver, Rph []  Rema Fendt) Glennon Mac, PharmD []  Arlyn Dunning, PharmD []  Netta Cedars, PharmD []  Dia Sitter, PharmD []  Leone Haven, PharmD []  Gretta Arab, PharmD []  Theodis Shove, PharmD []  Peggyann Juba, PharmD []  Reuel Boom, PharmD   Positive urine culture Treated with Cephalexin, organism sensitive to the same and no further patient follow-up is required at this time.  Sandi Raveling Rimsha Trembley 12/11/2019, 9:17 AM

## 2020-02-02 ENCOUNTER — Other Ambulatory Visit: Payer: Self-pay | Admitting: Physical Medicine and Rehabilitation

## 2020-02-02 DIAGNOSIS — M5416 Radiculopathy, lumbar region: Secondary | ICD-10-CM

## 2020-03-01 ENCOUNTER — Ambulatory Visit
Admission: RE | Admit: 2020-03-01 | Discharge: 2020-03-01 | Disposition: A | Discharge status: Home / Self Care | Payer: Medicare Other | Source: Ambulatory Visit | Attending: Physical Medicine and Rehabilitation | Admitting: Physical Medicine and Rehabilitation

## 2020-03-01 ENCOUNTER — Other Ambulatory Visit: Payer: Self-pay

## 2020-03-01 DIAGNOSIS — M5416 Radiculopathy, lumbar region: Secondary | ICD-10-CM

## 2020-03-14 ENCOUNTER — Other Ambulatory Visit: Payer: Medicare Other

## 2020-03-14 DIAGNOSIS — Z20822 Contact with and (suspected) exposure to covid-19: Secondary | ICD-10-CM

## 2020-03-16 LAB — SARS-COV-2, NAA 2 DAY TAT

## 2020-03-16 LAB — SPECIMEN STATUS REPORT

## 2020-03-16 LAB — NOVEL CORONAVIRUS, NAA: SARS-CoV-2, NAA: NOT DETECTED

## 2020-05-11 ENCOUNTER — Other Ambulatory Visit: Payer: Self-pay

## 2020-05-11 ENCOUNTER — Inpatient Hospital Stay (HOSPITAL_COMMUNITY)
Admission: RE | Admit: 2020-05-11 | Discharge: 2020-05-13 | DRG: 885 | Disposition: A | Discharge status: Home / Self Care | Payer: Medicare Other | Source: Intra-hospital | Attending: Psychiatry | Admitting: Psychiatry

## 2020-05-11 ENCOUNTER — Encounter (HOSPITAL_COMMUNITY): Payer: Self-pay | Admitting: Physician Assistant

## 2020-05-11 DIAGNOSIS — Z818 Family history of other mental and behavioral disorders: Secondary | ICD-10-CM

## 2020-05-11 DIAGNOSIS — E781 Pure hyperglyceridemia: Secondary | ICD-10-CM | POA: Diagnosis not present

## 2020-05-11 DIAGNOSIS — F17210 Nicotine dependence, cigarettes, uncomplicated: Secondary | ICD-10-CM | POA: Diagnosis not present

## 2020-05-11 DIAGNOSIS — Z885 Allergy status to narcotic agent status: Secondary | ICD-10-CM

## 2020-05-11 DIAGNOSIS — Z9152 Personal history of nonsuicidal self-harm: Secondary | ICD-10-CM | POA: Diagnosis not present

## 2020-05-11 DIAGNOSIS — M19072 Primary osteoarthritis, left ankle and foot: Secondary | ICD-10-CM | POA: Diagnosis not present

## 2020-05-11 DIAGNOSIS — M479 Spondylosis, unspecified: Secondary | ICD-10-CM | POA: Diagnosis present

## 2020-05-11 DIAGNOSIS — F332 Major depressive disorder, recurrent severe without psychotic features: Secondary | ICD-10-CM | POA: Diagnosis not present

## 2020-05-11 DIAGNOSIS — M797 Fibromyalgia: Secondary | ICD-10-CM | POA: Diagnosis not present

## 2020-05-11 DIAGNOSIS — E282 Polycystic ovarian syndrome: Secondary | ICD-10-CM | POA: Diagnosis present

## 2020-05-11 DIAGNOSIS — Z9884 Bariatric surgery status: Secondary | ICD-10-CM

## 2020-05-11 DIAGNOSIS — R45851 Suicidal ideations: Secondary | ICD-10-CM | POA: Diagnosis present

## 2020-05-11 DIAGNOSIS — Z79891 Long term (current) use of opiate analgesic: Secondary | ICD-10-CM

## 2020-05-11 DIAGNOSIS — Z811 Family history of alcohol abuse and dependence: Secondary | ICD-10-CM | POA: Diagnosis not present

## 2020-05-11 DIAGNOSIS — Z833 Family history of diabetes mellitus: Secondary | ICD-10-CM

## 2020-05-11 DIAGNOSIS — K760 Fatty (change of) liver, not elsewhere classified: Secondary | ICD-10-CM | POA: Diagnosis not present

## 2020-05-11 DIAGNOSIS — M5430 Sciatica, unspecified side: Secondary | ICD-10-CM | POA: Diagnosis present

## 2020-05-11 DIAGNOSIS — G8929 Other chronic pain: Secondary | ICD-10-CM | POA: Diagnosis present

## 2020-05-11 DIAGNOSIS — Z888 Allergy status to other drugs, medicaments and biological substances status: Secondary | ICD-10-CM

## 2020-05-11 DIAGNOSIS — M19071 Primary osteoarthritis, right ankle and foot: Secondary | ICD-10-CM | POA: Diagnosis present

## 2020-05-11 DIAGNOSIS — I10 Essential (primary) hypertension: Secondary | ICD-10-CM | POA: Diagnosis not present

## 2020-05-11 MED ORDER — HYDROXYZINE HCL 25 MG PO TABS
25.0000 mg | ORAL_TABLET | Freq: Three times a day (TID) | ORAL | Status: DC | PRN
  Administered 2020-05-11 – 2020-05-12 (×3): 25 mg via ORAL
  Filled 2020-05-11 (×3): qty 1

## 2020-05-11 MED ORDER — FLUOXETINE HCL 20 MG PO CAPS
20.0000 mg | ORAL_CAPSULE | Freq: Every day | ORAL | Status: DC
  Filled 2020-05-11 (×2): qty 1

## 2020-05-11 MED ORDER — FAMOTIDINE 40 MG PO TABS
40.0000 mg | ORAL_TABLET | Freq: Every day | ORAL | Status: DC
  Administered 2020-05-11 – 2020-05-13 (×3): 40 mg via ORAL
  Filled 2020-05-11 (×4): qty 1

## 2020-05-11 MED ORDER — GABAPENTIN 600 MG PO TABS
600.0000 mg | ORAL_TABLET | Freq: Three times a day (TID) | ORAL | Status: DC
  Administered 2020-05-11 – 2020-05-13 (×5): 600 mg via ORAL
  Filled 2020-05-11 (×8): qty 1

## 2020-05-11 MED ORDER — NYSTATIN 100000 UNIT/GM EX OINT
1.0000 "application " | TOPICAL_OINTMENT | Freq: Every day | CUTANEOUS | Status: DC | PRN
  Administered 2020-05-12 (×2): 1 via TOPICAL
  Filled 2020-05-11 (×2): qty 15

## 2020-05-11 MED ORDER — LAMOTRIGINE 100 MG PO TABS
100.0000 mg | ORAL_TABLET | Freq: Two times a day (BID) | ORAL | Status: DC
  Administered 2020-05-11 – 2020-05-13 (×4): 100 mg via ORAL
  Filled 2020-05-11 (×6): qty 1

## 2020-05-11 MED ORDER — MAGNESIUM HYDROXIDE 400 MG/5ML PO SUSP: 30.0000 mL | Freq: Every day | ORAL | Status: DC | PRN

## 2020-05-11 MED ORDER — PROPRANOLOL HCL 10 MG PO TABS
10.0000 mg | ORAL_TABLET | Freq: Every day | ORAL | Status: DC
  Administered 2020-05-11 – 2020-05-13 (×3): 10 mg via ORAL
  Filled 2020-05-11 (×4): qty 1

## 2020-05-11 MED ORDER — DULOXETINE HCL 60 MG PO CPEP
120.0000 mg | ORAL_CAPSULE | Freq: Every day | ORAL | Status: DC
  Administered 2020-05-11 – 2020-05-12 (×2): 120 mg via ORAL
  Filled 2020-05-11 (×3): qty 2

## 2020-05-11 MED ORDER — OXYCODONE HCL 5 MG PO TABS
10.0000 mg | ORAL_TABLET | ORAL | Status: DC | PRN
  Administered 2020-05-11 – 2020-05-13 (×7): 10 mg via ORAL
  Filled 2020-05-11 (×7): qty 2

## 2020-05-11 MED ORDER — ACETAMINOPHEN 325 MG PO TABS
650.0000 mg | ORAL_TABLET | Freq: Four times a day (QID) | ORAL | Status: DC | PRN
  Administered 2020-05-11: 650 mg via ORAL
  Filled 2020-05-11: qty 2

## 2020-05-11 MED ORDER — FLUOXETINE HCL 10 MG PO CAPS
30.0000 mg | ORAL_CAPSULE | Freq: Every day | ORAL | Status: DC
  Administered 2020-05-11 – 2020-05-13 (×3): 30 mg via ORAL
  Filled 2020-05-11 (×5): qty 3

## 2020-05-11 MED ORDER — MORPHINE SULFATE ER 15 MG PO TBCR
15.0000 mg | EXTENDED_RELEASE_TABLET | Freq: Two times a day (BID) | ORAL | Status: DC
  Administered 2020-05-12 – 2020-05-13 (×2): 15 mg via ORAL
  Filled 2020-05-11 (×2): qty 1

## 2020-05-11 MED ORDER — ALUM & MAG HYDROXIDE-SIMETH 200-200-20 MG/5ML PO SUSP
30.0000 mL | ORAL | Status: DC | PRN
  Administered 2020-05-12: 30 mL via ORAL
  Filled 2020-05-11: qty 30

## 2020-05-11 MED ORDER — TRAZODONE HCL 50 MG PO TABS: 50.0000 mg | ORAL_TABLET | Freq: Every evening | ORAL | Status: DC | PRN

## 2020-05-11 MED ORDER — VALACYCLOVIR HCL 500 MG PO TABS
1000.0000 mg | ORAL_TABLET | Freq: Three times a day (TID) | ORAL | Status: DC
  Administered 2020-05-11 – 2020-05-13 (×5): 1000 mg via ORAL
  Filled 2020-05-11 (×8): qty 2

## 2020-05-11 MED ORDER — NICOTINE 21 MG/24HR TD PT24
21.0000 mg | MEDICATED_PATCH | Freq: Every day | TRANSDERMAL | Status: DC
  Administered 2020-05-12 – 2020-05-13 (×2): 21 mg via TRANSDERMAL
  Filled 2020-05-11 (×3): qty 1

## 2020-05-11 MED ORDER — OXYCODONE HCL 5 MG PO TABS
10.0000 mg | ORAL_TABLET | ORAL | Status: DC
  Administered 2020-05-11: 10 mg via ORAL
  Filled 2020-05-11: qty 2

## 2020-05-11 MED ORDER — IBUPROFEN 800 MG PO TABS: 800.0000 mg | ORAL_TABLET | Freq: Four times a day (QID) | ORAL | Status: DC | PRN

## 2020-05-11 MED ORDER — TIZANIDINE HCL 2 MG PO TABS
2.0000 mg | ORAL_TABLET | Freq: Four times a day (QID) | ORAL | Status: DC
  Administered 2020-05-11 – 2020-05-13 (×7): 2 mg via ORAL
  Filled 2020-05-11 (×11): qty 1

## 2020-05-11 NOTE — Progress Notes (Signed)
Pt focused on pain medication, but pt seen on the unit and her movements, actions do not represent the pain she states she's min.     05/11/20 2300  Psych Admission Type (Psych Patients Only)  Admission Status Voluntary  Psychosocial Assessment  Patient Complaints Anxiety  Eye Contact Avertive;Brief  Facial Expression Anxious;Pained;Sullen;Sad;Worried  Affect Anxious;Depressed;Sad;Sullen  Speech Logical/coherent  Interaction Assertive  Motor Activity Fidgety;Restless;Unsteady  Appearance/Hygiene Unremarkable  Behavior Characteristics Anxious  Mood Depressed;Anxious  Thought Process  Coherency Concrete thinking  Content Blaming others  Delusions None reported or observed  Perception WDL  Hallucination None reported or observed  Judgment Poor  Confusion None  Danger to Self  Current suicidal ideation? Denies  Danger to Others  Danger to Others None reported or observed

## 2020-05-11 NOTE — BHH Suicide Risk Assessment (Signed)
[facility] Admission Suicide Risk Assessment   Nursing information obtained from:  Patient Demographic factors:  Low socioeconomic status, Caucasian Current Mental Status:  Suicidal ideation indicated by patient, Suicide plan, Belief that plan would result in death, Plan includes specific time, place, or method, Self-harm thoughts Loss Factors:  Decline in physical health, Financial problems / change in socioeconomic status Historical Factors:  Impulsivity Risk Reduction Factors:  Employed, Responsible for children under 20 years of age, Living with another person, especially a relative, Sense of responsibility to family  Total Time spent with patient: 20 minutes Principal Problem: <principal problem not specified> Diagnosis:  Active Problems:   MDD (major depressive disorder), recurrent episode, severe ([place])  Data: 44 year old woman with history of depression presenting with suicidal ideation.  Continued Clinical Symptoms:  Alcohol Use Disorder Identification Test Final Score (AUDIT): 0 The "Alcohol Use Disorders Identification Test", Guidelines for Use in Primary Care, Second Edition.  World Pharmacologist [facility]). Score between 0-7:  no or low risk or alcohol related problems. Score between 8-15:  moderate risk of alcohol related problems. Score between 16-19:  high risk of alcohol related problems. Score 20 or above:  warrants further diagnostic evaluation for alcohol dependence and treatment.   CLINICAL FACTORS:   Depression:   Anhedonia Hopelessness Severe Chronic Pain Medical Diagnoses and Treatments/Surgeries   Musculoskeletal: Strength & Muscle Tone: within normal limits Gait & Station: normal Patient leans: N/A  Psychiatric Specialty Exam: Physical Exam HENT:     Head: Normocephalic and atraumatic.  Musculoskeletal:        General: Normal range of motion.  Skin:    General: Skin is warm.  Neurological:     Mental Status: She is alert.     Review of Systems   All other systems reviewed and are negative.   Blood pressure 103/77, pulse 88, temperature 98.3 F (36.8 C), temperature source Oral, resp. rate 18, height 5\' 2"  (1.575 m), weight 99.3 kg, SpO2 97 %.Body mass index is 40.06 kg/m.  General Appearance: Casual  Eye Contact:  Fair  Speech:  Clear and Coherent  Volume:  Decreased  Mood:  Depressed and Hopeless  Affect:  Depressed and Tearful  Thought Process:  Coherent  Orientation:  Full (Time, Place, and Person)  Thought Content:  Logical and Rumination  Suicidal Thoughts:  Yes.  without intent/plan  Homicidal Thoughts:  No  Memory:  Recent;   Fair  Judgement:  Poor  Insight:  Lacking  Psychomotor Activity:  Normal  Concentration:  Concentration: Poor  Recall:  [employer] of Knowledge:  Fair  Language:  Fair  Akathisia:  No  Handed:  Right  AIMS (if indicated):     Assets:  Housing Leisure Time Transportation  ADL's:  Intact  Cognition:  WNL  Sleep:         COGNITIVE FEATURES THAT CONTRIBUTE TO RISK:  None    SUICIDE RISK:   Mild:  Suicidal ideation of limited frequency, intensity, duration, and specificity.  There are no identifiable plans, no associated intent, mild dysphoria and related symptoms, good self-control (both objective and subjective assessment), few other risk factors, and identifiable protective factors, including available and accessible social support.  PLAN OF CARE: Continue care on an inpatient basis  I certify that inpatient services furnished can reasonably be expected to improve the patient's condition.   Dixie Dials, MD 05/11/2020, 3:38 PM
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
## 2020-05-11 NOTE — BHH Group Notes (Signed)
[facility] LCSW Group Therapy  05/11/2020 2:32 PM  Type of Therapy:  Group Therapy  Participation Level:  Active  Participation Quality:  Appropriate and Attentive  Summary of Progress/Problems: Pt attended group but declined to share what she is grateful for.   Mliss Fritz 05/11/2020, 2:32 PM
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
## 2020-05-11 NOTE — Progress Notes (Signed)
Pt up to the nursing station requesting Oxy and then wanted to get her Morphine 30 minutes later. Pt was informed that was too close to give the same type  medication for the same reason. Pt stated she did not want the Morphine, but wanted the Oxy and then she could get it 4 hours later.
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
## 2020-05-11 NOTE — Tx Team (Signed)
Initial Treatment Plan 05/11/2020 12:32 PM Angela Massey KFM:[id number]    PATIENT STRESSORS: Financial difficulties Health problems Marital or family conflict Occupational concerns   PATIENT STRENGTHS: Ability for insight Capable of independent living Communication skills Motivation for treatment/growth Work skills   PATIENT IDENTIFIED PROBLEMS: Suicidal ideations  anxiety  Panic attacks  depression  Chronic pain             DISCHARGE CRITERIA:  Ability to meet basic life and health needs Improved stabilization in mood, thinking, and/or behavior Motivation to continue treatment in a less acute level of care Need for constant or close observation no longer present  PRELIMINARY DISCHARGE PLAN: Attend aftercare/continuing care group Participate in family therapy Placement in alternative living arrangements  PATIENT/FAMILY INVOLVEMENT: This treatment plan has been presented to and reviewed with the patient, Angela Massey.  The patient and family have been given the opportunity to ask questions and make suggestions.  Baron Sane, RN 05/11/2020, 12:32 PM

## 2020-05-11 NOTE — Progress Notes (Signed)
Skin Assessment:  Patient had bariatric surgery on Nov 16, 2019, several small round surgical scars on abdomen that are healed.  Tattoos on bilateral lower arms.

## 2020-05-12 LAB — CBC
HCT: 40.7 % (ref 36.0–46.0)
Hemoglobin: 12.9 g/dL (ref 12.0–15.0)
MCH: 25.8 pg — ABNORMAL LOW (ref 26.0–34.0)
MCHC: 31.7 g/dL (ref 30.0–36.0)
MCV: 81.4 fL (ref 80.0–100.0)
Platelets: 327 10*3/uL (ref 150–400)
RBC: 5 MIL/uL (ref 3.87–5.11)
RDW: 15.4 % (ref 11.5–15.5)
WBC: 14.5 10*3/uL — ABNORMAL HIGH (ref 4.0–10.5)
nRBC: 0 % (ref 0.0–0.2)

## 2020-05-12 LAB — HEMOGLOBIN A1C
Hgb A1c MFr Bld: 5.4 % (ref 4.8–5.6)
Mean Plasma Glucose: 108.28 mg/dL

## 2020-05-12 LAB — COMPREHENSIVE METABOLIC PANEL
ALT: 17 U/L (ref 0–44)
AST: 18 U/L (ref 15–41)
Albumin: 3.6 g/dL (ref 3.5–5.0)
Alkaline Phosphatase: 79 U/L (ref 38–126)
Anion gap: 8 (ref 5–15)
BUN: 11 mg/dL (ref 6–20)
CO2: 27 mmol/L (ref 22–32)
Calcium: 9.2 mg/dL (ref 8.9–10.3)
Chloride: 105 mmol/L (ref 98–111)
Creatinine, Ser: 0.6 mg/dL (ref 0.44–1.00)
GFR, Estimated: >60 mL/min (ref >60)
Glucose, Bld: 84 mg/dL (ref 70–99)
Potassium: 3.6 mmol/L (ref 3.5–5.1)
Sodium: 140 mmol/L (ref 135–145)
Total Bilirubin: 0.2 mg/dL — ABNORMAL LOW (ref 0.3–1.2)
Total Protein: 7.2 g/dL (ref 6.5–8.1)

## 2020-05-12 LAB — LIPID PANEL
Cholesterol: 193 mg/dL (ref 0–200)
HDL: 42 mg/dL (ref >40)
LDL Cholesterol: 126 mg/dL — ABNORMAL HIGH (ref 0–99)
Total CHOL/HDL Ratio: 4.6 RATIO
Triglycerides: 127 mg/dL (ref <150)
VLDL: 25 mg/dL (ref 0–40)

## 2020-05-12 LAB — TSH: TSH: 1.141 u[IU]/mL (ref 0.350–4.500)

## 2020-05-12 LAB — T4, FREE: Free T4: 0.73 ng/dL (ref 0.61–1.12)

## 2020-05-12 MED ORDER — ENSURE ENLIVE PO LIQD
237.0000 mL | Freq: Two times a day (BID) | ORAL | Status: DC
  Administered 2020-05-12 – 2020-05-13 (×3): 237 mL via ORAL
  Filled 2020-05-12 (×5): qty 237
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
## 2020-05-12 NOTE — Progress Notes (Signed)
[facility] MD Progress Note  05/12/2020 1:53 PM DEBRAH GRANDERSON  MRN:  [id number]   Note:   Patient seen, chart reviewed, case discussed with treatment team.  Patient continues to complain about her pain medication and please schedule.  She is frustrated as to comply nurses are unable to provide her with her medications at exactly the time that she requests.  It was explained to patient the logistical constraints of receiving any medication psychiatric unit.  Patient continues to complain of pain in her legs.  She was informed this is likely sciatic pain which she is not known to respond well to, nevertheless we will continue to prescribe the medications according to her pain management regimen.  Regarding her mood, patient is reporting improvement.  She is still somewhat irritable, but attributes that to her recent directions regarding her pain medications.  She states that there are no adverse effects of the increased dosage of fluoxetine.  She is requesting to be discharged as early as tomorrow.  She denies any acute suicidal ideation today, and stated that she is feeling better after having talked to her husband about the situation.  She is looking forward to going home.  She is eating and sleeping well.       Principal Problem: <principal problem not specified> Diagnosis: Active Problems:   MDD (major depressive disorder), recurrent episode, severe (HCC)  Total Time spent with patient: 20 minutes  Past Psychiatric History: See HPI  Past Medical History:  Past Medical History:  Diagnosis Date  . Anxiety   . Arthritis    back and ankles  . Depression   . Fatty liver   . Fibromyalgia   . Hypertension   . Hypertriglyceridemia   . Ovarian abscess   . PCOS (polycystic ovarian syndrome)   . Pseudotumor   . Sciatica     Past Surgical History:  Procedure Laterality Date  . Diagnostic Laparotomy    . planter facitis    . TUBAL LIGATION     Family History:  Family History  Problem  Relation Age of Onset  . Diabetes Maternal Grandmother   . Depression Mother   . Anxiety disorder Mother   . Alcohol abuse Father    Family Psychiatric  History:  Social History:  Social History   Substance and Sexual Activity  Alcohol Use Not Currently  . Alcohol/week: 0.0 standard drinks     Social History   Substance and Sexual Activity  Drug Use No    Social History   Socioeconomic History  . Marital status: Married    Spouse name: Not on file  . Number of children: 6  . Years of education: 76  . Highest education level: Not on file  Occupational History  . Occupation: Best boy: [employer]  Tobacco Use  . Smoking status: Current Some Day Smoker    Packs/day: 1.50    Years: 10.00    Pack years: 15.00    Types: Cigarettes    Last attempt to quit: 04/17/2017    Years since quitting: 3.0  . Smokeless tobacco: Never Used  Vaping Use  . Vaping Use: Never used  Substance and Sexual Activity  . Alcohol use: Not Currently    Alcohol/week: 0.0 standard drinks  . Drug use: No  . Sexual activity: Yes    Partners: Male    Birth control/protection: Surgical  Other Topics Concern  . Not on file  Social History Narrative   Born in [place] and  raised there until 10 by mom and step dad. Biological parents divorced when she was 25mo. She then moved to [place] states. At 44yo she moved to [place] by herself for [employer] and then moved to [place] and lived there for 55ys. Pt has one older brother and one older sister. Pt has an associates degree in Energy manager. 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 Pt has been married 3x. Current marriage for 10 yrs and has 3 biological kids and 3 adopted kids.    Social Determinants of Health   Financial Resource Strain:   . Difficulty of Paying Living Expenses: Not on file  Food Insecurity:   . Worried About Charity fundraiser in the Last Year: Not on file  . Ran Out of Food in the Last Year:  Not on file  Transportation Needs:   . Lack of Transportation (Medical): Not on file  . Lack of Transportation (Non-Medical): Not on file  Physical Activity:   . Days of Exercise per Week: Not on file  . Minutes of Exercise per Session: Not on file  Stress:   . Feeling of Stress : Not on file  Social Connections:   . Frequency of Communication with Friends and Family: Not on file  . Frequency of Social Gatherings with Friends and Family: Not on file  . Attends Religious Services: Not on file  . Active Member of Clubs or Organizations: Not on file  . Attends Archivist Meetings: Not on file  . Marital Status: Not on file   Additional Social History:    Pain Medications: See MAR Prescriptions: See MAR Over the Counter: See MAR History of alcohol / drug use?: Yes Name of Substance 1: Cigarettes. 1 - Age of First Use: UTA 1 - Amount (size/oz): Pt reported, smoking 1.5 cigarettes, daily. 1 - Frequency: Daily. 1 - Duration: Ongoing. 1 - Last Use / Amount: Daily.                  Sleep: Good  Appetite:  Good  Current Medications: Current Facility-Administered Medications  Medication Dose Route Frequency Provider Last Rate Last Admin  . acetaminophen (TYLENOL) tablet 650 mg  650 mg Oral Q6H PRN Nwoko, Uchenna E, PA   650 mg at 05/11/20 1419  . alum & mag hydroxide-simeth (MAALOX/MYLANTA) 200-200-20 MG/5ML suspension 30 mL  30 mL Oral Q4H PRN Nwoko, Uchenna E, PA      . DULoxetine (CYMBALTA) DR capsule 120 mg  120 mg Oral QHS Saide Lanuza A, MD   120 mg at 05/11/20 2119  . famotidine (PEPCID) tablet 40 mg  40 mg Oral Daily Jonpaul Lumm, Dorene Ar, MD   40 mg at 05/12/20 0806  . feeding supplement (ENSURE ENLIVE / ENSURE PLUS) liquid 237 mL  237 mL Oral BID BM Damita Dunnings B, MD   237 mL at 05/12/20 1342  . FLUoxetine (PROZAC) capsule 30 mg  30 mg Oral Daily Diron Haddon, Dorene Ar, MD   30 mg at 05/12/20 0806  . gabapentin (NEURONTIN) tablet 600 mg  600 mg Oral TID  Saleemah Mollenhauer A, MD   600 mg at 05/12/20 1128  . hydrOXYzine (ATARAX/VISTARIL) tablet 25 mg  25 mg Oral TID PRN Nwoko, Uchenna E, PA   25 mg at 05/11/20 2119  . ibuprofen (ADVIL) tablet 800 mg  800 mg Oral Q6H PRN Izzak Fries A, MD      . lamoTRIgine (LAMICTAL) tablet 100 mg  100  mg Oral BID Dixie Dials, MD   100 mg at 05/12/20 0807  . magnesium hydroxide (MILK OF MAGNESIA) suspension 30 mL  30 mL Oral Daily PRN Nwoko, Uchenna E, PA      . morphine (MS CONTIN) 12 hr tablet 15 mg  15 mg Oral Q12H Azyria Osmon A, MD      . nicotine (NICODERM CQ - dosed in mg/24 hours) patch 21 mg  21 mg Transdermal Daily Sharma Covert, MD   21 mg at 05/12/20 0805  . nystatin ointment (MYCOSTATIN) 1 application  1 application Topical Daily PRN Star Cheese, Dorene Ar, MD   1 application at 35/46/56 0807  . oxyCODONE (Oxy IR/ROXICODONE) immediate release tablet 10 mg  10 mg Oral Q4H PRN Karia Ehresman, Dorene Ar, MD   10 mg at 05/12/20 1127  . propranolol (INDERAL) tablet 10 mg  10 mg Oral Daily Mishawn Didion, Dorene Ar, MD   10 mg at 05/12/20 0807  . tiZANidine (ZANAFLEX) tablet 2 mg  2 mg Oral QID Amyriah Buras, Dorene Ar, MD   2 mg at 05/12/20 1128  . valACYclovir (VALTREX) tablet 1,000 mg  1,000 mg Oral TID Avriel Kandel, Dorene Ar, MD   1,000 mg at 05/12/20 1128    Lab Results:  Results for orders placed or performed during the hospital encounter of 05/11/20 (from the past 48 hour(s))  Hemoglobin A1c     Status: None   Collection Time: 05/12/20  6:17 AM  Result Value Ref Range   Hgb A1c MFr Bld 5.4 4.8 - 5.6 %    Comment: (NOTE) Pre diabetes:          5.7%-6.4%  Diabetes:              >6.4%  Glycemic control for   <7.0% adults with diabetes    Mean Plasma Glucose 108.28 mg/dL    Comment: Performed at [facility], [place] [address] [place], [place] [address]  Lipid panel     Status: Abnormal   Collection Time: 05/12/20  6:17 AM  Result Value Ref Range   Cholesterol 193 0 - 200 mg/dL   Triglycerides  127 <150 mg/dL   HDL 42 >40 mg/dL   Total CHOL/HDL Ratio 4.6 RATIO   VLDL 25 0 - 40 mg/dL   LDL Cholesterol 126 (H) 0 - 99 mg/dL    Comment:        Total Cholesterol/HDL:CHD Risk Coronary Heart Disease Risk Table                     Men   Women  1/2 Average Risk   3.4   3.3  Average Risk       5.0   4.4  2 X Average Risk   9.6   7.1  3 X Average Risk  23.4   11.0        Use the calculated Patient Ratio above and the CHD Risk Table to determine the patient's CHD Risk.        ATP III CLASSIFICATION (LDL):  <100     mg/dL   Optimal  100-129  mg/dL   Near or Above                    Optimal  130-159  mg/dL   Borderline  160-189  mg/dL   High  >190     mg/dL   Very High Performed at [address] [place], [place]  [address]   TSH     Status: None   Collection Time: 05/12/20  6:17 AM  Result Value Ref Range   TSH 1.141 0.350 - 4.500 uIU/mL    Comment: Performed by a 3rd Generation assay with a functional sensitivity of <=0.01 uIU/mL. Performed at [facility], [place] [address], [place], [place] [address]   CBC     Status: Abnormal   Collection Time: 05/12/20  6:17 AM  Result Value Ref Range   WBC 14.5 (H) 4.0 - 10.5 K/uL   RBC 5.00 3.87 - 5.11 MIL/uL   Hemoglobin 12.9 12.0 - 15.0 g/dL   HCT 40.7 36 - 46 %   MCV 81.4 80.0 - 100.0 fL   MCH 25.8 (L) 26.0 - 34.0 pg   MCHC 31.7 30.0 - 36.0 g/dL   RDW 15.4 11.5 - 15.5 %   Platelets 327 150 - 400 K/uL   nRBC 0.0 0.0 - 0.2 %    Comment: Performed at [facility], [place] [address], [place], [place] [address]  Comprehensive metabolic panel     Status: Abnormal   Collection Time: 05/12/20  6:17 AM  Result Value Ref Range   Sodium 140 135 - 145 mmol/L   Potassium 3.6 3.5 - 5.1 mmol/L   Chloride 105 98 - 111 mmol/L   CO2 27 22 - 32 mmol/L   Glucose, Bld 84 70 - 99 mg/dL    Comment: Glucose reference range applies only to samples taken after fasting for at least 8  hours.   BUN 11 6 - 20 mg/dL   Creatinine, Ser 0.60 0.44 - 1.00 mg/dL   Calcium 9.2 8.9 - 10.3 mg/dL   Total Protein 7.2 6.5 - 8.1 g/dL   Albumin 3.6 3.5 - 5.0 g/dL   AST 18 15 - 41 U/L   ALT 17 0 - 44 U/L   Alkaline Phosphatase 79 38 - 126 U/L   Total Bilirubin 0.2 (L) 0.3 - 1.2 mg/dL   GFR, Estimated >60 >60 mL/min    Comment: (NOTE) Calculated using the CKD-EPI Creatinine Equation (2021)    Anion gap 8 5 - 15    Comment: Performed at [facility], [place] [address], [place], [place] [address]  T4, free     Status: None   Collection Time: 05/12/20  6:17 AM  Result Value Ref Range   Free T4 0.73 0.61 - 1.12 ng/dL    Comment: (NOTE) Biotin ingestion may interfere with free T4 tests. If the results are inconsistent with the TSH level, previous test results, or the clinical presentation, then consider biotin interference. If needed, order repeat testing after stopping biotin. Performed at [facility], [place] [address] [place],  [address]     Blood Alcohol level:  Lab Results  Component Value Date   [facility] <10 10/17/2019   ETH <10 42/87/6811    Metabolic Disorder Labs: Lab Results  Component Value Date   HGBA1C 5.4 05/12/2020   MPG 108.28 05/12/2020   Lab Results  Component Value Date   PROLACTIN 22.7 07/27/2013   Lab Results  Component Value Date   CHOL 193 05/12/2020   TRIG 127 05/12/2020   HDL 42 05/12/2020   CHOLHDL 4.6 05/12/2020   VLDL 25 05/12/2020   LDLCALC 126 (H) 05/12/2020    Physical Findings: AIMS: Facial and Oral Movements Muscles of Facial Expression: None, normal Lips and Perioral Area: None, normal Jaw: None, normal Tongue: None, normal,Extremity Movements  Upper (arms, wrists, hands, fingers): None, normal Lower (legs, knees, ankles, toes): None, normal, Trunk Movements Neck, shoulders, hips: None, normal, Overall Severity Severity of abnormal movements (highest score from questions above): None,  normal Incapacitation due to abnormal movements: None, normal Patient's awareness of abnormal movements (rate only patient's report): No Awareness, Dental Status Current problems with teeth and/or dentures?: No Does patient usually wear dentures?: No  CIWA:    COWS:     Musculoskeletal: Strength & Muscle Tone: within normal limits Gait & Station: normal Patient leans: N/A  Psychiatric Specialty Exam: Physical Exam  Review of Systems  Blood pressure 103/77, pulse 88, temperature 98.3 F (36.8 C), temperature source Oral, resp. rate 18, height 5\' 2"  (1.575 m), weight 99.3 kg, SpO2 97 %.Body mass index is 40.06 kg/m.  General Appearance: Casual  Eye Contact:  Good  Speech:  Clear and Coherent  Volume:  Normal  Mood:  Irritable  Affect:  Congruent  Thought Process:  Coherent  Orientation:  Full (Time, Place, and Person)  Thought Content:  Logical  Suicidal Thoughts:  No  Homicidal Thoughts:  No  Memory:  Recent;   Fair  Judgement:  Fair  Insight:  Fair  Psychomotor Activity:  Normal  Concentration:  Concentration: Fair  Recall:  [employer] of Knowledge:  Fair  Language:  Fair  Akathisia:  No  Handed:  Right  AIMS (if indicated):     Assets:  Communication Skills Desire for Improvement Housing Intimacy Leisure Time Resilience  ADL's:  Intact  Cognition:  WNL  Sleep:  Number of Hours: 4.25     Treatment Plan Summary: Daily contact with patient to assess and evaluate symptoms and progress in treatment   Assessment: Patient is a 44 year old woman presenting with significant depression and suicidal ideation.  Patient is experiencing these in the context of interpersonal relationship difficulties with her husband of 14 years.  Additionally, patient is on high-dose opiate therapy due to her chronic pain which is significantly limiting the efficacy of psychiatric medications to address her depression.  Patient will benefit from inpatient hospitalization for purposes of  safety, stabilization, medication management.    Plan:  Continue Prozac to 30 mg p.o. every morning Continue duloxetine 60 mg twice daily  Continue gabapentin 600 mg 3 times daily Continue Lamictal 100 mg twice daily Continue propranolol 10 mg daily   Continue morphine MS Contin 15 mg twice daily Continue oxycodone 10 mg every 4 hours Continue valacyclovir 1000 mg 3 times daily Continue famotidine 40 mg daily  Continue to educate patient as to the use of opioid medications    Dixie Dials, MD 05/12/2020, 1:53 PM

## 2020-05-12 NOTE — Progress Notes (Signed)
Pt continues th have her focus on her pain medications    05/12/20 2000  Psych Admission Type (Psych Patients Only)  Admission Status Voluntary  Psychosocial Assessment  Patient Complaints Anxiety  Eye Contact Avertive;Brief  Facial Expression Anxious;Pained;Sullen;Sad;Worried  Affect Anxious;Depressed;Sad;Sullen  Speech Logical/coherent  Interaction Assertive  Motor Activity Fidgety;Restless;Unsteady  Appearance/Hygiene Unremarkable  Behavior Characteristics Anxious  Mood Depressed  Thought Process  Coherency Concrete thinking  Content Blaming others  Delusions None reported or observed  Perception WDL  Hallucination None reported or observed  Judgment Poor  Confusion None  Danger to Self  Current suicidal ideation? Denies  Danger to Others  Danger to Others None reported or observed

## 2020-05-12 NOTE — BHH Group Notes (Signed)
The focus of this group is to help patients establish daily goals to achieve during treatment and discuss how the patient can incorporate goal setting into their daily lives to aide in recovery.   Patient did not attend group.
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
## 2020-05-13 LAB — T3, FREE: T3, Free: 2.3 pg/mL (ref 2.0–4.4)

## 2020-05-13 MED ORDER — FLUOXETINE HCL 10 MG PO CAPS: 30.0000 mg | ORAL_CAPSULE | Freq: Every day | ORAL | 0 refills | Status: DC

## 2020-05-13 MED ORDER — DULOXETINE HCL 60 MG PO CPEP: 120.0000 mg | ORAL_CAPSULE | Freq: Every day | ORAL | 0 refills | Status: DC

## 2020-05-13 NOTE — Plan of Care (Signed)
Discharge note  Patient verbalizes readiness for discharge. Follow up plan explained, AVS, Transition record and SRA given. Prescriptions and teaching provided. Belongings returned and signed for. Suicide safety plan completed and signed. Patient verbalizes understanding. Patient denies SI/HI and assures this Probation officer they will seek assistance should that change. Patient discharged to lobby where husband was waiting.  Problem: Education: Goal: Ability to state activities that reduce stress will improve Outcome: Adequate for Discharge   Problem: Coping: Goal: Ability to identify and develop effective coping behavior will improve Outcome: Adequate for Discharge   Problem: Self-Concept: Goal: Ability to identify factors that promote anxiety will improve Outcome: Adequate for Discharge Goal: Level of anxiety will decrease Outcome: Adequate for Discharge Goal: Ability to modify response to factors that promote anxiety will improve Outcome: Adequate for Discharge   Problem: Education: Goal: Utilization of techniques to improve thought processes will improve Outcome: Adequate for Discharge Goal: Knowledge of the prescribed therapeutic regimen will improve Outcome: Adequate for Discharge   Problem: Activity: Goal: Interest or engagement in leisure activities will improve Outcome: Adequate for Discharge Goal: Imbalance in normal sleep/wake cycle will improve Outcome: Adequate for Discharge   Problem: Coping: Goal: Coping ability will improve Outcome: Adequate for Discharge Goal: Will verbalize feelings Outcome: Adequate for Discharge   Problem: Health Behavior/Discharge Planning: Goal: Ability to make decisions will improve Outcome: Adequate for Discharge Goal: Compliance with therapeutic regimen will improve Outcome: Adequate for Discharge   Problem: Role Relationship: Goal: Will demonstrate positive changes in social behaviors and relationships Outcome: Adequate for Discharge    Problem: Safety: Goal: Ability to disclose and discuss suicidal ideas will improve Outcome: Adequate for Discharge Goal: Ability to identify and utilize support systems that promote safety will improve Outcome: Adequate for Discharge   Problem: Self-Concept: Goal: Will verbalize positive feelings about self Outcome: Adequate for Discharge Goal: Level of anxiety will decrease Outcome: Adequate for Discharge   Problem: Activity: Goal: Will identify at least one activity in which they can participate Outcome: Adequate for Discharge   Problem: Coping: Goal: Ability to identify and develop effective coping behavior will improve Outcome: Adequate for Discharge Goal: Ability to interact with others will improve Outcome: Adequate for Discharge Goal: Demonstration of participation in decision-making regarding own care will improve Outcome: Adequate for Discharge Goal: Ability to use eye contact when communicating with others will improve Outcome: Adequate for Discharge   Problem: Health Behavior/Discharge Planning: Goal: Identification of resources available to assist in meeting health care needs will improve Outcome: Adequate for Discharge   Problem: Self-Concept: Goal: Will verbalize positive feelings about self Outcome: Adequate for Discharge   Problem: Education: Goal: Ability to incorporate positive changes in behavior to improve self-esteem will improve Outcome: Adequate for Discharge   Problem: Health Behavior/Discharge Planning: Goal: Ability to identify and utilize available resources and services will improve Outcome: Adequate for Discharge Goal: Ability to remain free from injury will improve Outcome: Adequate for Discharge   Problem: Self-Concept: Goal: Will verbalize positive feelings about self Outcome: Adequate for Discharge   Problem: Skin Integrity: Goal: Demonstration of wound healing without infection will improve Outcome: Adequate for Discharge    Problem: Education: Goal: Ability to make informed decisions regarding treatment will improve Outcome: Adequate for Discharge   Problem: Coping: Goal: Coping ability will improve Outcome: Adequate for Discharge   Problem: Health Behavior/Discharge Planning: Goal: Identification of resources available to assist in meeting health care needs will improve Outcome: Adequate for Discharge   Problem: Medication: Goal: Compliance with  prescribed medication regimen will improve Outcome: Adequate for Discharge   Problem: Self-Concept: Goal: Ability to disclose and discuss suicidal ideas will improve Outcome: Adequate for Discharge Goal: Will verbalize positive feelings about self Outcome: Adequate for Discharge   Problem: Education: Goal: Knowledge of [facility] Education information/materials will improve Outcome: Adequate for Discharge Goal: Emotional status will improve Outcome: Adequate for Discharge Goal: Mental status will improve Outcome: Adequate for Discharge Goal: Verbalization of understanding the information provided will improve Outcome: Adequate for Discharge   Problem: Activity: Goal: Interest or engagement in activities will improve Outcome: Adequate for Discharge Goal: Sleeping patterns will improve Outcome: Adequate for Discharge   Problem: Coping: Goal: Ability to verbalize frustrations and anger appropriately will improve Outcome: Adequate for Discharge Goal: Ability to demonstrate self-control will improve Outcome: Adequate for Discharge   Problem: Health Behavior/Discharge Planning: Goal: Identification of resources available to assist in meeting health care needs will improve Outcome: Adequate for Discharge Goal: Compliance with treatment plan for underlying cause of condition will improve Outcome: Adequate for Discharge   Problem: Physical Regulation: Goal: Ability to maintain clinical measurements within normal limits will improve Outcome:  Adequate for Discharge   Problem: Safety: Goal: Periods of time without injury will increase Outcome: Adequate for Discharge

## 2020-05-13 NOTE — [facility] Suicide Risk Assessment (Signed)
[facility] Discharge Suicide Risk Assessment   Principal Problem: <principal problem not specified> Discharge Diagnoses: Active Problems:   MDD (major depressive disorder), recurrent episode, severe ([place])   Total Time spent with patient: 20 minutes  Musculoskeletal: Strength & Muscle Tone: within normal limits Gait & Station: normal Patient leans: N/A  Psychiatric Specialty Exam: Review of Systems  All other systems reviewed and are negative.   Blood pressure 122/73, pulse 67, temperature 97.8 F (36.6 C), temperature source Oral, resp. rate 18, height 5\' 2"  (1.575 m), weight 99.3 kg, SpO2 98 %.Body mass index is 40.06 kg/m.  General Appearance: Casual  Eye Contact::  Good  Speech:  Clear and Coherent409  Volume:  Normal  Mood:  Euthymic  Affect:  Appropriate  Thought Process:  Coherent  Orientation:  Full (Time, Place, and Person)  Thought Content:  Logical  Suicidal Thoughts:  No  Homicidal Thoughts:  No  Memory:  Recent;   Fair  Judgement:  Fair  Insight:  Fair  Psychomotor Activity:  Normal  Concentration:  Fair  Recall:  [employer] of Knowledge:Fair  Language: Fair  Akathisia:  No  Handed:  Right  AIMS (if indicated):     Assets:  Communication Skills Desire for Improvement Housing Intimacy Leisure Time Resilience Social Support  Sleep:  Number of Hours: 4.25  Cognition: WNL  ADL's:  Intact   Mental Status Per Nursing Assessment::   On Admission:  Suicidal ideation indicated by patient, Suicide plan, Belief that plan would result in death, Plan includes specific time, place, or method, Self-harm thoughts  Demographic Factors:  Caucasian  Loss Factors: Loss of significant relationship and Financial problems/change in socioeconomic status  Historical Factors: Prior suicide attempts and Impulsivity  Risk Reduction Factors:   Responsible for children under 66 years of age, Sense of responsibility to family, Employed, Living with another person, especially  a relative, Positive social support, Positive therapeutic relationship and NA  Continued Clinical Symptoms:  Chronic Pain  Cognitive Features That Contribute To Risk:  Closed-mindedness    Suicide Risk:  Mild:  Suicidal ideation of limited frequency, intensity, duration, and specificity.  There are no identifiable plans, no associated intent, mild dysphoria and related symptoms, good self-control (both objective and subjective assessment), few other risk factors, and identifiable protective factors, including available and accessible social support.   Follow-up [place] Follow up on 05/23/2020.   Why: You have an appointment on 05/23/20 at 1:00 pm for therapy and medication management services. This will be a Virtual appointment.  Contact information: [address] [place]  [address] [phone]               Plan Of Care/Follow-up recommendations:  Other:  Follow-up with outpatient care  Dixie Dials, MD 05/13/2020, 9:31 AM

## 2020-05-13 NOTE — Progress Notes (Signed)
The focus of this group is to help patients establish daily goals to achieve during treatment and discuss how the patient can incorporate goal setting into their daily lives to aide in recovery. The patient attended group.

## 2020-05-13 NOTE — Tx Team (Addendum)
Interdisciplinary Treatment and Diagnostic Plan Update  05/13/2020 Time of Session: 9:00am  Angela Massey MRN: [id number]  Principal Diagnosis: <principal problem not specified>  Secondary Diagnoses: Active Problems:   MDD (major depressive disorder), recurrent episode, severe ([facility])   Current Medications:  Current Facility-Administered Medications  Medication Dose Route Frequency Provider Last Rate Last Admin   acetaminophen (TYLENOL) tablet 650 mg  650 mg Oral Q6H PRN Nwoko, Uchenna E, PA   650 mg at 05/11/20 1419   alum & mag hydroxide-simeth (MAALOX/MYLANTA) 200-200-20 MG/5ML suspension 30 mL  30 mL Oral Q4H PRN Nwoko, Uchenna E, PA   30 mL at 05/12/20 2225   DULoxetine (CYMBALTA) DR capsule 120 mg  120 mg Oral QHS Cristofano, Paul A, MD   120 mg at 05/12/20 2222   famotidine (PEPCID) tablet 40 mg  40 mg Oral Daily Cristofano, Paul A, MD   40 mg at 05/13/20 0800   feeding supplement (ENSURE ENLIVE / ENSURE PLUS) liquid 237 mL  237 mL Oral BID BM Damita Dunnings B, MD   237 mL at 05/13/20 0908   FLUoxetine (PROZAC) capsule 30 mg  30 mg Oral Daily Cristofano, Paul A, MD   30 mg at 05/13/20 0801   gabapentin (NEURONTIN) tablet 600 mg  600 mg Oral TID Dixie Dials, MD   600 mg at 05/13/20 0801   hydrOXYzine (ATARAX/VISTARIL) tablet 25 mg  25 mg Oral TID PRN Ileene Musa E, PA   25 mg at 05/12/20 2222   ibuprofen (ADVIL) tablet 800 mg  800 mg Oral Q6H PRN Cristofano, Dorene Ar, MD       lamoTRIgine (LAMICTAL) tablet 100 mg  100 mg Oral BID Cristofano, Paul A, MD   100 mg at 05/13/20 0800   magnesium hydroxide (MILK OF MAGNESIA) suspension 30 mL  30 mL Oral Daily PRN Nwoko, Uchenna E, PA       morphine (MS CONTIN) 12 hr tablet 15 mg  15 mg Oral Q12H Cristofano, Paul A, MD   15 mg at 05/13/20 0800   nicotine (NICODERM CQ - dosed in mg/24 hours) patch 21 mg  21 mg Transdermal Daily Sharma Covert, MD   21 mg at 05/13/20 0800   nystatin ointment (MYCOSTATIN) 1 application   1 application Topical Daily PRN Cristofano, Dorene Ar, MD   1 application at 62/83/66 2947   oxyCODONE (Oxy IR/ROXICODONE) immediate release tablet 10 mg  10 mg Oral Q4H PRN Cristofano, Dorene Ar, MD   10 mg at 05/13/20 6546   propranolol (INDERAL) tablet 10 mg  10 mg Oral Daily Cristofano, Dorene Ar, MD   10 mg at 05/13/20 0801   tiZANidine (ZANAFLEX) tablet 2 mg  2 mg Oral QID Cristofano, Paul A, MD   2 mg at 05/13/20 0801   valACYclovir (VALTREX) tablet 1,000 mg  1,000 mg Oral TID Dixie Dials, MD   1,000 mg at 05/13/20 0801   PTA Medications: Medications Prior to Admission  Medication Sig Dispense Refill Last Dose   busPIRone (BUSPAR) 10 MG tablet Take 10 mg by mouth 3 (three) times daily.      famotidine (PEPCID) 40 MG tablet Take 40 mg by mouth daily.      FLUoxetine (PROZAC) 20 MG capsule Take 20 mg by mouth at bedtime.      gabapentin (NEURONTIN) 300 MG capsule Take 600 mg by mouth 3 (three) times daily.       ibuprofen (ADVIL,MOTRIN) 200 MG tablet Take 800 mg by mouth  every 6 (six) hours as needed for mild pain or moderate pain.      morphine (MS CONTIN) 15 MG 12 hr tablet Take 15 mg by mouth every 12 (twelve) hours.      nystatin ointment (MYCOSTATIN) Apply 1 application topically daily as needed.      Oxycodone HCl 10 MG TABS Take 10 mg by mouth every 4 (four) hours.       propranolol (INDERAL) 10 MG tablet Take 10 mg by mouth daily.      valACYclovir (VALTREX) 1000 MG tablet Take 1,000 mg by mouth 3 (three) times daily.      DULoxetine (CYMBALTA) 60 MG capsule Take 120 mg by mouth at bedtime.       lamoTRIgine (LAMICTAL) 100 MG tablet Take 100 mg by mouth 2 (two) times daily.       tiZANidine (ZANAFLEX) 2 MG tablet Take 2 mg by mouth 4 (four) times daily.        Patient Stressors: Financial difficulties Health problems Marital or family conflict Occupational concerns  Patient Strengths: Ability for insight Capable of independent living Music therapist Motivation for treatment/growth Work skills  Treatment Modalities: Medication Management, Group therapy, Case management,  1 to 1 session with clinician, Psychoeducation, Recreational therapy.   Physician Treatment Plan for Primary Diagnosis: <principal problem not specified> Long Term Goal(s): Improvement in symptoms so as ready for discharge   Short Term Goals: Ability to identify changes in lifestyle to reduce recurrence of condition will improve Ability to verbalize feelings will improve Ability to disclose and discuss suicidal ideas Ability to demonstrate self-control will improve Ability to identify and develop effective coping behaviors will improve Ability to maintain clinical measurements within normal limits will improve  Medication Management: Evaluate patient's response, side effects, and tolerance of medication regimen.  Therapeutic Interventions: 1 to 1 sessions, Unit Group sessions and Medication administration.  Evaluation of Outcomes: Adequate for Discharge  Physician Treatment Plan for Secondary Diagnosis: Active Problems:   MDD (major depressive disorder), recurrent episode, severe ([place])  Long Term Goal(s): Improvement in symptoms so as ready for discharge   Short Term Goals: Ability to identify changes in lifestyle to reduce recurrence of condition will improve Ability to verbalize feelings will improve Ability to disclose and discuss suicidal ideas Ability to demonstrate self-control will improve Ability to identify and develop effective coping behaviors will improve Ability to maintain clinical measurements within normal limits will improve     Medication Management: Evaluate patient's response, side effects, and tolerance of medication regimen.  Therapeutic Interventions: 1 to 1 sessions, Unit Group sessions and Medication administration.  Evaluation of Outcomes: Adequate for Discharge   RN Treatment Plan for Primary Diagnosis: <principal  problem not specified> Long Term Goal(s): Knowledge of disease and therapeutic regimen to maintain health will improve  Short Term Goals: Ability to remain free from injury will improve, Ability to participate in decision making will improve, Ability to verbalize feelings will improve, Ability to disclose and discuss suicidal ideas and Ability to identify and develop effective coping behaviors will improve  Medication Management: RN will administer medications as ordered by provider, will assess and evaluate patient's response and provide education to patient for prescribed medication. RN will report any adverse and/or side effects to prescribing provider.  Therapeutic Interventions: 1 on 1 counseling sessions, Psychoeducation, Medication administration, Evaluate responses to treatment, Monitor vital signs and CBGs as ordered, Perform/monitor CIWA, COWS, AIMS and Fall Risk screenings as ordered, Perform wound care treatments as ordered.  Evaluation of Outcomes: Adequate for Discharge   LCSW Treatment Plan for Primary Diagnosis: <principal problem not specified> Long Term Goal(s): Safe transition to appropriate next level of care at discharge, Engage patient in therapeutic group addressing interpersonal concerns.  Short Term Goals: Engage patient in aftercare planning with referrals and resources, Increase social support, Increase emotional regulation, Facilitate acceptance of mental health diagnosis and concerns, Identify triggers associated with mental health/substance abuse issues and Increase skills for wellness and recovery  Therapeutic Interventions: Assess for all discharge needs, 1 to 1 time with Social worker, Explore available resources and support systems, Assess for adequacy in community support network, Educate family and significant other(s) on suicide prevention, Complete Psychosocial Assessment, Interpersonal group therapy.  Evaluation of Outcomes: Adequate for  Discharge   Progress in Treatment: Attending groups: Yes. Participating in groups: No. Taking medication as prescribed: Yes. Toleration medication: Yes. Family/Significant other contact made: Yes, individual(s) contacted:  Daughter  Patient understands diagnosis: Yes. and No. Discussing patient identified problems/goals with staff: Yes. Medical problems stabilized or resolved: Yes. Denies suicidal/homicidal ideation: Yes. Issues/concerns per patient self-inventory: No.   New problem(s) identified: No, Describe:  None   New Short Term/Long Term Goal(s): medication stabilization, elimination of SI thoughts, development of comprehensive mental wellness plan.   Patient Goals:  "To get back to work"   Discharge Plan or Barriers: Pt will return home and will follow up with outpatient therapy and medication management   Reason for Continuation of Hospitalization: Medication stabilization  Estimated Length of Stay: Adequate for discharge   Attendees: Patient: Angela Massey 05/13/2020   Physician: Lala Lund, MD 05/13/2020   Nursing:  05/13/2020   RN Care Manager: 05/13/2020   Social Worker: Verdis Frederickson, [place] 05/13/2020   Recreational Therapist:  05/13/2020   Other:  05/13/2020   Other:  05/13/2020   Other: 05/13/2020     Scribe for Treatment Team: Darleen Crocker, [place] 05/13/2020 9:37 AM

## 2020-05-13 NOTE — Progress Notes (Signed)
Pt continues to focus on her pain medication, pt was educated about the medications and decreased respirations, but pt appeared not wanting education at this time

## 2020-05-13 NOTE — Progress Notes (Signed)
  [facility] Adult Case Management Discharge Plan :  Will you be returning to the same living situation after discharge:  Yes,  home with husband At discharge, do you have transportation home?: Yes,  via husband Do you have the ability to pay for your medications: Yes,  has [facility]  Release of information consent forms completed and in the chart;  Patient's signature needed at discharge.  Patient to Follow up at:  Follow-up [place] Follow up on 05/23/2020.   Why: You have an appointment on 05/23/20 at 1:00 pm for therapy and medication management services. This will be a Virtual appointment.  Contact information: [address] [place] [address] [phone]               Next level of care provider has access to [place] and Suicide Prevention discussed: Yes,  with Dominica Yackley(daughter) [phone]     Has patient been referred to the Quitline?: Patient refused referral  Patient has been referred for addiction treatment: N/A  Mliss Fritz, Latanya Presser 05/13/2020, 9:13 AM
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
## 2020-05-22 ENCOUNTER — Encounter (HOSPITAL_COMMUNITY): Payer: Self-pay

## 2020-05-22 ENCOUNTER — Other Ambulatory Visit: Payer: Self-pay

## 2020-05-22 ENCOUNTER — Emergency Department (HOSPITAL_COMMUNITY)
Admission: EM | Admit: 2020-05-22 | Discharge: 2020-05-22 | Disposition: A | Discharge status: Home / Self Care | Payer: Medicare Other | Attending: Emergency Medicine | Admitting: Emergency Medicine

## 2020-05-22 DIAGNOSIS — Z79899 Other long term (current) drug therapy: Secondary | ICD-10-CM | POA: Insufficient documentation

## 2020-05-22 DIAGNOSIS — L02212 Cutaneous abscess of back [any part, except buttock]: Secondary | ICD-10-CM | POA: Insufficient documentation

## 2020-05-22 DIAGNOSIS — I10 Essential (primary) hypertension: Secondary | ICD-10-CM | POA: Insufficient documentation

## 2020-05-22 DIAGNOSIS — F17210 Nicotine dependence, cigarettes, uncomplicated: Secondary | ICD-10-CM | POA: Diagnosis not present

## 2020-05-22 DIAGNOSIS — L0291 Cutaneous abscess, unspecified: Secondary | ICD-10-CM

## 2020-05-22 NOTE — ED Provider Notes (Signed)
[facility] EMERGENCY DEPARTMENT Provider Note   CSN: [id number] Arrival date & time: 05/22/20  1323     History Chief Complaint  Patient presents with  . Abscess    Angela Massey is a 44 y.o. female.  HPI She is here for evaluation of a sore on her back which was treated with I&D several days ago and she was started on antibiotic.  Her husband has been squeezing the area because it looks swollen, and he has gotten a lot of discolored material out of it.  The patient is worried that the wound is infected.  When she had the I&D she was told that it was not clear what the swollen area was.  Apparently "a biopsy was sent."  Patient denies systemic symptoms including fever, vomiting or dizziness.  There are no other known modifying factors.    Past Medical History:  Diagnosis Date  . Anxiety   . Arthritis    back and ankles  . Depression   . Fatty liver   . Fibromyalgia   . Hypertension   . Hypertriglyceridemia   . Ovarian abscess   . PCOS (polycystic ovarian syndrome)   . Pseudotumor   . Sciatica     Patient Active Problem List   Diagnosis Date Noted  . MDD (major depressive disorder), recurrent episode, severe ([place]) 05/11/2020  . MDD (major depressive disorder), recurrent episode, moderate ([place]) 10/11/2015  . GAD (generalized anxiety disorder) 10/11/2015  . Panic disorder without agoraphobia 10/11/2015  . Insomnia 10/11/2015  . Hyperprolactinemia ([place]) 05/26/2013  . Depression, major 05/26/2013    Past Surgical History:  Procedure Laterality Date  . Diagnostic Laparotomy    . planter facitis    . TUBAL LIGATION       OB History    Gravida  4   Para  3   Term  3   Preterm      AB      Living  3     SAB      TAB      Ectopic      Multiple      Live Births              Family History  Problem Relation Age of Onset  . Diabetes Maternal Grandmother   . Depression Mother   . Anxiety disorder Mother   . Alcohol abuse Father      Social History   Tobacco Use  . Smoking status: Current Some Day Smoker    Packs/day: 1.50    Years: 10.00    Pack years: 15.00    Types: Cigarettes    Last attempt to quit: 04/17/2017    Years since quitting: 3.0  . Smokeless tobacco: Never Used  Vaping Use  . Vaping Use: Never used  Substance Use Topics  . Alcohol use: Not Currently    Alcohol/week: 0.0 standard drinks  . Drug use: No    Home Medications Prior to Admission medications   Medication Sig Start Date End Date Taking? Authorizing Provider  DULoxetine (CYMBALTA) 60 MG capsule Take 2 capsules (120 mg total) by mouth at bedtime. 05/13/20   Cristofano, Dorene Ar, MD  famotidine (PEPCID) 40 MG tablet Take 40 mg by mouth daily. 05/02/20   [provider]  FLUoxetine (PROZAC) 10 MG capsule Take 3 capsules (30 mg total) by mouth daily. 05/14/20   Cristofano, Dorene Ar, MD  gabapentin (NEURONTIN) 300 MG capsule Take 600 mg by mouth 3 (three) times  daily.  07/05/18   [provider]  lamoTRIgine (LAMICTAL) 100 MG tablet Take 100 mg by mouth 2 (two) times daily.     [provider]  morphine (MS CONTIN) 15 MG 12 hr tablet Take 15 mg by mouth every 12 (twelve) hours.    [provider]  nystatin ointment (MYCOSTATIN) Apply 1 application topically daily as needed. 05/04/20   [provider]  Oxycodone HCl 10 MG TABS Take 10 mg by mouth every 4 (four) hours.     [provider]  propranolol (INDERAL) 10 MG tablet Take 10 mg by mouth daily. 10/05/19   [provider]  tiZANidine (ZANAFLEX) 2 MG tablet Take 2 mg by mouth 4 (four) times daily.  10/16/19   [provider]  valACYclovir (VALTREX) 1000 MG tablet Take 1,000 mg by mouth 3 (three) times daily. 05/02/20   [provider]    Allergies    Codeine, Phenergan [promethazine hcl], and Seroquel [quetiapine fumarate]  Review of Systems   Review of Systems  All other systems reviewed and are  negative.   Physical Exam Updated Vital Signs BP 123/80 (BP Location: Right Arm)   Pulse 85   Temp 98.9 F (37.2 C) (Oral)   Resp 18   Ht 5\' 2"  (1.575 m)   Wt 96.2 kg   LMP 05/01/2020   SpO2 99%   BMI 38.78 kg/m   Physical Exam Vitals and nursing note reviewed.  Constitutional:      General: She is not in acute distress.    Appearance: She is well-developed. She is obese. She is not ill-appearing, toxic-appearing or diaphoretic.  HENT:     Head: Normocephalic and atraumatic.  Eyes:     Conjunctiva/sclera: Conjunctivae normal.     Pupils: Pupils are equal, round, and reactive to light.  Neck:     Trachea: Phonation normal.  Cardiovascular:     Rate and Rhythm: Normal rate.  Pulmonary:     Effort: Pulmonary effort is normal.  Abdominal:     General: There is no distension.     Tenderness: There is no guarding.  Musculoskeletal:        General: Normal range of motion.     Cervical back: Normal range of motion and neck supple.  Skin:    General: Skin is warm and dry.     Comments: Draining abscess, right middle lower thoracic region.  There is an open area about 1 cm in length, with mild surrounding induration but no fluctuance or erythema.  With light pressure small amount of discolored drainage, releases from the opening.  This area is minimally tender to palpation.  She does not have altered motion of the back.  Neurological:     Mental Status: She is alert and oriented to person, place, and time.     Motor: No abnormal muscle tone.  Psychiatric:        Mood and Affect: Mood normal.        Behavior: Behavior normal.        Thought Content: Thought content normal.        Judgment: Judgment normal.     ED Results / Procedures / Treatments   Labs (all labs ordered are listed, but only abnormal results are displayed) Labs Reviewed - No data to display  EKG None  Radiology No results found.  Procedures Procedures (including critical care time)  Medications  Ordered in ED Medications - No data to display  ED Course  I have reviewed the triage vital signs and the nursing notes.  Pertinent labs & imaging results that were available during my care of the patient were reviewed by me and considered in my medical decision making (see chart for details).    MDM Rules/Calculators/A&P                           Patient Vitals for the past 24 hrs:  BP Temp Temp src Pulse Resp SpO2 Height Weight  05/22/20 1356 123/80 98.9 F (37.2 C) Oral 85 18 99 % -- --  05/22/20 1354 -- -- -- -- -- -- 5\' 2"  (1.575 m) 96.2 kg    2:33 PM Reevaluation with update and discussion. After initial assessment and treatment, an updated evaluation reveals no change in clinical status, findings discussed with the patient and all questions were answered. Daleen Bo   Medical Decision Making:  This patient is presenting for evaluation of draining wound of her back, which does not require a range of treatment options, and is not a complaint that involves a high risk of morbidity and mortality. The differential diagnoses include abscess, cellulitis, cyst. I decided to review old records, and in summary middle-aged female, being treated for abscess with I&D, and antibiotics; presenting for persistent symptoms following this treatment.  I obtained additional historical information from husband at the bedside.      Critical Interventions-clinical evaluation, discussion with patient and husband  After These Interventions, the Patient was reevaluated and was found stable for discharge.  Patient's abscess is draining, is small, and does not require further evaluation in the ED.  Doubt deep tissue infection or complication from I&D  CRITICAL CARE-no Performed by: Daleen Bo  Nursing Notes Reviewed/ Care Coordinated Applicable Imaging Reviewed Interpretation of Laboratory Data incorporated into ED treatment  The patient appears reasonably screened and/or stabilized for  discharge and I doubt any other medical condition or other [facility] requiring further screening, evaluation, or treatment in the ED at this time prior to discharge.  Plan: Home Medications-continue usual; Home Treatments-frequent warm compresses; return here if the recommended treatment, does not improve the symptoms; Recommended follow up-PCP,.     Final Clinical Impression(s) / ED Diagnoses Final diagnoses:  None    Rx / DC Orders ED Discharge Orders    None       Daleen Bo, MD 05/22/20 1438

## 2020-05-22 NOTE — ED Triage Notes (Signed)
Pt reports has bump on her back for the past 9 months.  Reports her doctor is aware.  Reports for the past week, it has been tender.  Had I and D last week.  Reports area is very tender.  Reports had biopsy sent to pathologist and was put on keflex.

## 2020-05-22 NOTE — Discharge Instructions (Addendum)
The wound on your back is draining material that looks like infection.  To help keep it draining and improve healing, use a warm compress on it for 30 minutes every 1-2 hours, until the pain, drainage and swelling resolves.  Continue taking the antibiotic which has already been started.  Return here or see the doctor of your choice as needed for problems.

## 2020-07-20 ENCOUNTER — Other Ambulatory Visit: Payer: Self-pay

## 2020-07-20 DIAGNOSIS — I739 Peripheral vascular disease, unspecified: Secondary | ICD-10-CM

## 2020-07-30 ENCOUNTER — Other Ambulatory Visit: Payer: Self-pay

## 2020-07-30 DIAGNOSIS — Y92410 Unspecified street and highway as the place of occurrence of the external cause: Secondary | ICD-10-CM | POA: Insufficient documentation

## 2020-07-30 DIAGNOSIS — I10 Essential (primary) hypertension: Secondary | ICD-10-CM | POA: Diagnosis not present

## 2020-07-30 DIAGNOSIS — G8929 Other chronic pain: Secondary | ICD-10-CM | POA: Diagnosis not present

## 2020-07-30 DIAGNOSIS — Z79899 Other long term (current) drug therapy: Secondary | ICD-10-CM | POA: Diagnosis not present

## 2020-07-30 DIAGNOSIS — M542 Cervicalgia: Secondary | ICD-10-CM | POA: Diagnosis present

## 2020-07-30 DIAGNOSIS — R519 Headache, unspecified: Secondary | ICD-10-CM | POA: Insufficient documentation

## 2020-07-30 DIAGNOSIS — R590 Localized enlarged lymph nodes: Secondary | ICD-10-CM | POA: Insufficient documentation

## 2020-07-30 DIAGNOSIS — M545 Low back pain, unspecified: Secondary | ICD-10-CM | POA: Diagnosis not present

## 2020-07-30 DIAGNOSIS — F17210 Nicotine dependence, cigarettes, uncomplicated: Secondary | ICD-10-CM | POA: Insufficient documentation

## 2020-07-31 ENCOUNTER — Emergency Department (HOSPITAL_COMMUNITY): Payer: Medicare Other

## 2020-07-31 ENCOUNTER — Emergency Department (HOSPITAL_COMMUNITY)
Admission: EM | Admit: 2020-07-31 | Discharge: 2020-07-31 | Disposition: A | Discharge status: Home / Self Care | Payer: Medicare Other | Attending: Emergency Medicine | Admitting: Emergency Medicine

## 2020-07-31 ENCOUNTER — Encounter (HOSPITAL_COMMUNITY): Payer: Self-pay | Admitting: Emergency Medicine

## 2020-07-31 ENCOUNTER — Other Ambulatory Visit: Payer: Self-pay

## 2020-07-31 DIAGNOSIS — G8929 Other chronic pain: Secondary | ICD-10-CM

## 2020-07-31 DIAGNOSIS — M542 Cervicalgia: Secondary | ICD-10-CM

## 2020-07-31 DIAGNOSIS — M545 Low back pain, unspecified: Secondary | ICD-10-CM

## 2020-07-31 MED ORDER — METHOCARBAMOL 500 MG PO TABS: ORAL_TABLET | ORAL | 0 refills | Status: DC

## 2020-07-31 MED ORDER — KETOROLAC TROMETHAMINE 30 MG/ML IJ SOLN
30.0000 mg | Freq: Once | INTRAMUSCULAR | Status: AC
  Administered 2020-07-31: 30 mg via INTRAMUSCULAR
  Filled 2020-07-31: qty 1

## 2020-07-31 MED ORDER — NAPROXEN 500 MG PO TABS: ORAL_TABLET | ORAL | 0 refills | Status: DC

## 2020-07-31 NOTE — Discharge Instructions (Addendum)
Ice packs to the injured or sore muscles for the next several days then start using heat. Take the medications for pain and muscle spasms. Return to the ED for any problems listed on the head injury sheet. Recheck if you aren't improving in the next week.  Please call Dr.Teoh's office to have him evaluate you.  He is a ears nose and throat specialist.  You have some swollen lymph nodes and some fullness in the area around your vocal cords.  This is a concern with your history of smoking.  Please consider to continue trying to quit smoking.
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
## 2020-08-11 ENCOUNTER — Ambulatory Visit: Payer: Medicare Other | Admitting: Vascular Surgery

## 2020-08-11 ENCOUNTER — Encounter: Payer: Self-pay | Admitting: Vascular Surgery

## 2020-08-11 ENCOUNTER — Ambulatory Visit (HOSPITAL_COMMUNITY)
Admission: RE | Admit: 2020-08-11 | Discharge: 2020-08-11 | Disposition: A | Discharge status: Home / Self Care | Payer: Medicare Other | Source: Ambulatory Visit | Attending: Vascular Surgery | Admitting: Vascular Surgery

## 2020-08-11 ENCOUNTER — Other Ambulatory Visit: Payer: Self-pay

## 2020-08-11 VITALS — BP 116/81 | HR 70 | Temp 97.7°F | Resp 20 | Ht 62.0 in | Wt 202.9 lb

## 2020-08-11 DIAGNOSIS — I739 Peripheral vascular disease, unspecified: Secondary | ICD-10-CM | POA: Diagnosis not present

## 2020-08-11 DIAGNOSIS — G609 Hereditary and idiopathic neuropathy, unspecified: Secondary | ICD-10-CM | POA: Diagnosis not present

## 2020-08-11 NOTE — Progress Notes (Signed)
Referring Physician: Dr Greta Doom  Patient name: Angela Massey MRN: [id number] DOB: [birth year] Sex: female  REASON FOR CONSULT: Leg pain rule out peripheral arterial disease  HPI: AMIYAH SHRYOCK is a 45 y.o. female, with bilateral leg pain.  She develops burning stinging numbness and tingling in both legs after walking a short distance.  The left leg is worse than the right.  She also has chronic back pain.  She complains primarily of left foot intermittent numbness.  She also awakens from sleep at night with a fire type sensation in her feet.  She does smoke but is trying to quit.  She has no nonhealing wounds.  She was seen for similar symptoms in 2017 and had normal ABIs at that point.  She has had a gastric sleeve since that time and has lost about 150 pounds.  She states that she did have her vitamin B12 checked recently and it was normal.  Other medical problems include hypertension, hypertriglycerides, arthritis, all of been stable.  Past Medical History:  Diagnosis Date  . Anxiety   . Arthritis    back and ankles  . Depression   . Fatty liver   . Fibromyalgia   . Hypertension   . Hypertriglyceridemia   . Ovarian abscess   . PCOS (polycystic ovarian syndrome)   . Pseudotumor   . Sciatica    Past Surgical History:  Procedure Laterality Date  . Diagnostic Laparotomy    . planter facitis    . TUBAL LIGATION      Family History  Problem Relation Age of Onset  . Diabetes Maternal Grandmother   . Depression Mother   . Anxiety disorder Mother   . Alcohol abuse Father     SOCIAL HISTORY: Social History   Socioeconomic History  . Marital status: Married    Spouse name: Not on file  . Number of children: 6  . Years of education: 61  . Highest education level: Not on file  Occupational History  . Occupation: Best boy: [employer]  Tobacco Use  . Smoking status: Current Some Day Smoker    Packs/day: 0.25    Years: 10.00    Pack years: 2.50    Types:  Cigarettes    Last attempt to quit: 04/17/2017    Years since quitting: 3.3  . Smokeless tobacco: Never Used  Vaping Use  . Vaping Use: Every day  . Substances: Nicotine  Substance and Sexual Activity  . Alcohol use: Not Currently    Alcohol/week: 0.0 standard drinks  . Drug use: No  . Sexual activity: Yes    Partners: Male    Birth control/protection: Surgical  Other Topics Concern  . Not on file  Social History Narrative   Born in [place] and raised there until 10 by mom and step dad. Biological parents divorced when she was 8mo. She then moved to [place] states. At 45yo she moved to [place] by herself for [employer] and then moved to [place] and lived there for 16ys. Pt has one older brother and one older sister. Pt has an associates degree in Energy manager. 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 Pt has been married 3x. Current marriage for 10 yrs and has 3 biological kids and 3 adopted kids.    Social Determinants of Health   Financial Resource Strain: Not on file  Food Insecurity: Not on file  Transportation Needs: Not on file  Physical Activity:  Not on file  Stress: Not on file  Social Connections: Not on file  Intimate Partner Violence: Not on file    Allergies  Allergen Reactions  . Codeine Hives  . Phenergan [Promethazine Hcl]     convulsions  . Seroquel [Quetiapine Fumarate]     Caused severe hypotension    Current Outpatient Medications  Medication Sig Dispense Refill  . DULoxetine (CYMBALTA) 60 MG capsule Take 2 capsules (120 mg total) by mouth at bedtime. 60 capsule 0  . FLUoxetine (PROZAC) 10 MG capsule Take 3 capsules (30 mg total) by mouth daily. 90 capsule 0  . gabapentin (NEURONTIN) 300 MG capsule Take 600 mg by mouth 3 (three) times daily.     Marland Kitchen lamoTRIgine (LAMICTAL) 100 MG tablet Take 100 mg by mouth 3 (three) times daily.    . methocarbamol (ROBAXIN) 500 MG tablet Take 1 or 2 po Q 6hrs for muscle pain 60 tablet 0  .  nystatin ointment (MYCOSTATIN) Apply 1 application topically daily as needed.    . Oxycodone HCl 10 MG TABS Take 10 mg by mouth every 4 (four) hours.     . propranolol (INDERAL) 10 MG tablet Take 10 mg by mouth daily.    Marland Kitchen tiZANidine (ZANAFLEX) 2 MG tablet Take 2 mg by mouth 4 (four) times daily.     . valACYclovir (VALTREX) 1000 MG tablet Take 1,000 mg by mouth 3 (three) times daily.     No current facility-administered medications for this visit.    ROS:   General:  No weight loss, Fever, chills  HEENT: No recent headaches, no nasal bleeding, no visual changes, no sore throat  Neurologic: No dizziness, blackouts, seizures. No recent symptoms of stroke or mini- stroke. No recent episodes of slurred speech, or temporary blindness.  Cardiac: No recent episodes of chest pain/pressure, no shortness of breath at rest.  No shortness of breath with exertion.  Denies history of atrial fibrillation or irregular heartbeat  Vascular: No history of rest pain in feet.  No history of claudication.  No history of non-healing ulcer, No history of DVT   Pulmonary: No home oxygen, no productive cough, no hemoptysis,  No asthma or wheezing  Musculoskeletal:  [X]  Arthritis, [X]  Low back pain,  [X]  Joint pain  Hematologic:No history of hypercoagulable state.  No history of easy bleeding.  No history of anemia  Gastrointestinal: No hematochezia or melena,  No gastroesophageal reflux, no trouble swallowing  Urinary: [ ]  chronic Kidney disease, [ ]  on HD - [ ]  MWF or [ ]  TTHS, [ ]  Burning with urination, [ ]  Frequent urination, [ ]  Difficulty urinating;   Skin: No rashes  Psychological: No history of anxiety,  No history of depression   Physical Examination  Vitals:   08/11/20 1423  BP: 116/81  Pulse: 70  Resp: 20  Temp: 97.7 F (36.5 C)  SpO2: 96%  Weight: 202 lb 14.4 oz (92 kg)  Height: 5\' 2"  (1.575 m)    Body mass index is 37.11 kg/m.  General:  Alert and oriented, no acute  distress HEENT: Normal Neck: No JVD Cardiac: Regular Rate and Rhythm  Skin: No rash Extremity Pulses:  2+ radial, brachial, absent dorsalis pedis, 2+ posterior tibial pulses bilaterally Musculoskeletal: No deformity or edema  Neurologic: Upper and lower extremity motor 5/5 and symmetric  DATA:  Patient had bilateral ABIs performed today which were triphasic greater than 1 and normal bilaterally.  These were similar to her prior ABIs in 2017.  I also reviewed a CT scan done with contrast in 2019 for other reasons.  On the scan she had no significant aortoiliac occlusive disease.  ASSESSMENT: Numbness and tingling burning in feet most likely consistent with neuropathy.  No evidence of peripheral arterial disease on 2 prior ABI tests over the last 5 years and a CT scan showing no aortoiliac occlusive disease in 2019.  She has palpable posterior tibial pulses.   PLAN: Patient will follow up with [place] on an as-needed basis.  Smoking cessation was encouraged.   Ruta Hinds, MD [facility] Office: [phone]

## 2020-08-23 ENCOUNTER — Emergency Department (HOSPITAL_COMMUNITY)
Admission: EM | Admit: 2020-08-23 | Discharge: 2020-08-23 | Disposition: A | Discharge status: Home / Self Care | Payer: Medicare Other | Attending: Emergency Medicine | Admitting: Emergency Medicine

## 2020-08-23 ENCOUNTER — Other Ambulatory Visit: Payer: Self-pay

## 2020-08-23 ENCOUNTER — Encounter (HOSPITAL_COMMUNITY): Payer: Self-pay | Admitting: Emergency Medicine

## 2020-08-23 DIAGNOSIS — F17210 Nicotine dependence, cigarettes, uncomplicated: Secondary | ICD-10-CM | POA: Insufficient documentation

## 2020-08-23 DIAGNOSIS — N644 Mastodynia: Secondary | ICD-10-CM | POA: Diagnosis present

## 2020-08-23 DIAGNOSIS — N643 Galactorrhea not associated with childbirth: Secondary | ICD-10-CM

## 2020-08-23 DIAGNOSIS — R5084 Febrile nonhemolytic transfusion reaction: Secondary | ICD-10-CM | POA: Diagnosis not present

## 2020-08-23 DIAGNOSIS — O926 Galactorrhea: Secondary | ICD-10-CM | POA: Insufficient documentation

## 2020-08-23 DIAGNOSIS — R937 Abnormal findings on diagnostic imaging of other parts of musculoskeletal system: Secondary | ICD-10-CM | POA: Diagnosis not present

## 2020-08-23 DIAGNOSIS — Z79899 Other long term (current) drug therapy: Secondary | ICD-10-CM | POA: Insufficient documentation

## 2020-08-23 DIAGNOSIS — I10 Essential (primary) hypertension: Secondary | ICD-10-CM | POA: Diagnosis not present

## 2020-08-23 MED ORDER — CLINDAMYCIN HCL 150 MG PO CAPS
450.0000 mg | ORAL_CAPSULE | Freq: Once | ORAL | Status: AC
  Administered 2020-08-23: 450 mg via ORAL
  Filled 2020-08-23: qty 3

## 2020-08-23 MED ORDER — CLINDAMYCIN HCL 150 MG PO CAPS: 450.0000 mg | ORAL_CAPSULE | Freq: Three times a day (TID) | ORAL | 0 refills | Status: AC

## 2020-08-23 NOTE — Discharge Instructions (Signed)
You were seen in the ER for breast pain, redness, swelling   We did not have breast ultrasound at his time  I have ordered an ultrasound of your breast to be done in the next 24-48 hours. Someone will call you tomorrow for a time and date.  I have also placed a referral to the [facility]. They should call you in the next few days to schedule an appointment.    Alternatively, you can call the breast imaging center (below) for an appointment  Dr Arnoldo Morale is the general surgeon in the area, you may call him instead if you would like a consult with him in the area. He could refer you elsewhere if he needs to  Take clindamycin as prescribed. Place a warm wet rag or massage the area. Take 1000 mg acetaminophen every 8 hours for at least the next 72 hours for pain  Return to ER for increased pain, redness, warmth, pus, fevers  Please note that your CT of head and cervical showed swelling of lymph nodes on your neck and left clavicle area. You need to discuss this with your primary care doctor or OBGYN

## 2020-08-23 NOTE — ED Triage Notes (Signed)
Pt states that her PCP told her to come in due to a mass on her right breast that is getting larger. States that mass appeared about 1 week ago.
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
## 2020-08-24 ENCOUNTER — Other Ambulatory Visit (HOSPITAL_COMMUNITY): Payer: Self-pay | Admitting: Emergency Medicine

## 2020-08-24 ENCOUNTER — Other Ambulatory Visit: Payer: Self-pay | Admitting: Emergency Medicine

## 2020-08-24 DIAGNOSIS — N611 Abscess of the breast and nipple: Secondary | ICD-10-CM

## 2020-09-06 DIAGNOSIS — J392 Other diseases of pharynx: Secondary | ICD-10-CM | POA: Insufficient documentation

## 2020-09-06 DIAGNOSIS — R590 Localized enlarged lymph nodes: Secondary | ICD-10-CM | POA: Insufficient documentation

## 2020-09-07 ENCOUNTER — Other Ambulatory Visit: Payer: Self-pay | Admitting: Otolaryngology

## 2020-09-07 DIAGNOSIS — R590 Localized enlarged lymph nodes: Secondary | ICD-10-CM

## 2020-09-14 ENCOUNTER — Ambulatory Visit
Admission: RE | Admit: 2020-09-14 | Discharge: 2020-09-14 | Disposition: A | Discharge status: Home / Self Care | Payer: Medicare Other | Source: Ambulatory Visit | Attending: Otolaryngology | Admitting: Otolaryngology

## 2020-09-14 DIAGNOSIS — R590 Localized enlarged lymph nodes: Secondary | ICD-10-CM

## 2020-09-20 ENCOUNTER — Encounter (HOSPITAL_COMMUNITY): Payer: Self-pay | Admitting: Radiology

## 2020-09-20 ENCOUNTER — Other Ambulatory Visit (HOSPITAL_COMMUNITY): Payer: Self-pay | Admitting: Otolaryngology

## 2020-09-20 DIAGNOSIS — R590 Localized enlarged lymph nodes: Secondary | ICD-10-CM

## 2020-09-20 NOTE — Progress Notes (Signed)
Angela Massey. Angela Massey Female, 45 y.o., [birth year]  MRN:  [id number] Phone:  [phone] Jerilynn Mages)       PCP:  Clancy Gourd, NP Primary Cvg:  [facility]            RE: [place] FNA SOFT TISSUE Received: Today Suttle, Rosanne Ashing, MD  Garth Bigness D  Approved for ultrasound guided left cervical lymph node biopsy.   Dylan        Previous Messages   ----- Message -----  From: Garth Bigness D  Sent: 09/20/2020  1:41 PM EDT  To: Ir Procedure Requests  Subject: [place] FNA SOFT TISSUE                Procedure:  [place] FNA SOFT TISSUE   Reason: Cervical lymphadenopathy   History:  [place], CT in computer   Provider:  Melida Quitter   Provider Contact:  [phone]

## 2020-09-22 ENCOUNTER — Other Ambulatory Visit: Payer: Self-pay | Admitting: Student

## 2020-09-22 ENCOUNTER — Other Ambulatory Visit: Payer: Self-pay | Admitting: Radiology

## 2020-09-26 ENCOUNTER — Encounter (HOSPITAL_COMMUNITY): Payer: Self-pay

## 2020-09-26 ENCOUNTER — Ambulatory Visit (HOSPITAL_COMMUNITY)
Admission: RE | Admit: 2020-09-26 | Discharge: 2020-09-26 | Disposition: A | Discharge status: Home / Self Care | Payer: Medicare Other | Source: Ambulatory Visit | Attending: Otolaryngology | Admitting: Otolaryngology

## 2020-09-26 ENCOUNTER — Other Ambulatory Visit: Payer: Self-pay

## 2020-09-26 DIAGNOSIS — Z79899 Other long term (current) drug therapy: Secondary | ICD-10-CM | POA: Insufficient documentation

## 2020-09-26 DIAGNOSIS — R590 Localized enlarged lymph nodes: Secondary | ICD-10-CM | POA: Insufficient documentation

## 2020-09-26 DIAGNOSIS — D360 Benign neoplasm of lymph nodes: Secondary | ICD-10-CM | POA: Diagnosis not present

## 2020-09-26 DIAGNOSIS — F17210 Nicotine dependence, cigarettes, uncomplicated: Secondary | ICD-10-CM | POA: Diagnosis not present

## 2020-09-26 MED ORDER — MIDAZOLAM HCL 2 MG/2ML IJ SOLN
INTRAMUSCULAR | Status: AC | PRN
  Administered 2020-09-26: 0.5 mg via INTRAVENOUS
  Administered 2020-09-26: 1 mg via INTRAVENOUS

## 2020-09-26 MED ORDER — MIDAZOLAM HCL 2 MG/2ML IJ SOLN
INTRAMUSCULAR | Status: AC
  Filled 2020-09-26: qty 2

## 2020-09-26 MED ORDER — FENTANYL CITRATE (PF) 100 MCG/2ML IJ SOLN
INTRAMUSCULAR | Status: AC
  Filled 2020-09-26: qty 2

## 2020-09-26 MED ORDER — FENTANYL CITRATE (PF) 100 MCG/2ML IJ SOLN
INTRAMUSCULAR | Status: AC | PRN
  Administered 2020-09-26 (×2): 25 ug via INTRAVENOUS

## 2020-09-26 MED ORDER — LIDOCAINE HCL (PF) 1 % IJ SOLN
INTRAMUSCULAR | Status: AC
  Filled 2020-09-26: qty 30

## 2020-09-26 MED ORDER — SODIUM CHLORIDE 0.9 % IV SOLN: INTRAVENOUS | Status: DC

## 2020-09-26 NOTE — Procedures (Signed)
Interventional Radiology Procedure Note  Procedure: Lymph node biopsy  Indication: Enlarged left neck lymph node  Findings: Please refer to procedural dictation for full description.  Complications: None  EBL: < 10 mL  Miachel Roux, MD [phone]

## 2020-09-26 NOTE — Sedation Documentation (Signed)
Bx site assessed- clean, dry and intact. No drainage from site. Soft to palpation
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
## 2020-09-26 NOTE — Discharge Instructions (Addendum)

## 2020-09-26 NOTE — Sedation Documentation (Signed)
Attempted report x1. 

## 2020-09-27 LAB — SURGICAL PATHOLOGY

## 2020-10-27 ENCOUNTER — Encounter (HOSPITAL_COMMUNITY): Payer: Self-pay | Admitting: Radiology

## 2020-11-03 ENCOUNTER — Other Ambulatory Visit: Payer: Self-pay

## 2020-11-03 ENCOUNTER — Emergency Department (HOSPITAL_COMMUNITY)
Admission: EM | Admit: 2020-11-03 | Discharge: 2020-11-03 | Disposition: A | Discharge status: Home / Self Care | Payer: Medicare Other | Attending: Emergency Medicine | Admitting: Emergency Medicine

## 2020-11-03 ENCOUNTER — Encounter (HOSPITAL_COMMUNITY): Payer: Self-pay

## 2020-11-03 DIAGNOSIS — J020 Streptococcal pharyngitis: Secondary | ICD-10-CM | POA: Diagnosis not present

## 2020-11-03 DIAGNOSIS — F17210 Nicotine dependence, cigarettes, uncomplicated: Secondary | ICD-10-CM | POA: Insufficient documentation

## 2020-11-03 DIAGNOSIS — H65192 Other acute nonsuppurative otitis media, left ear: Secondary | ICD-10-CM | POA: Insufficient documentation

## 2020-11-03 DIAGNOSIS — I10 Essential (primary) hypertension: Secondary | ICD-10-CM | POA: Insufficient documentation

## 2020-11-03 DIAGNOSIS — J029 Acute pharyngitis, unspecified: Secondary | ICD-10-CM

## 2020-11-03 DIAGNOSIS — H66001 Acute suppurative otitis media without spontaneous rupture of ear drum, right ear: Secondary | ICD-10-CM | POA: Insufficient documentation

## 2020-11-03 MED ORDER — AMOXICILLIN 250 MG PO CAPS
1000.0000 mg | ORAL_CAPSULE | Freq: Once | ORAL | Status: AC
  Administered 2020-11-03: 1000 mg via ORAL
  Filled 2020-11-03: qty 4

## 2020-11-03 MED ORDER — LIDOCAINE VISCOUS HCL 2 % MT SOLN: 15.0000 mL | OROMUCOSAL | 0 refills | Status: DC | PRN

## 2020-11-03 MED ORDER — KETOROLAC TROMETHAMINE 60 MG/2ML IM SOLN
60.0000 mg | Freq: Once | INTRAMUSCULAR | Status: AC
  Administered 2020-11-03: 60 mg via INTRAMUSCULAR
  Filled 2020-11-03: qty 2

## 2020-11-03 MED ORDER — AMOXICILLIN 250 MG/5ML PO SUSR: 1000.0000 mg | Freq: Two times a day (BID) | ORAL | 0 refills | Status: AC

## 2020-11-03 MED ORDER — DEXAMETHASONE 4 MG PO TABS
10.0000 mg | ORAL_TABLET | Freq: Once | ORAL | Status: AC
  Administered 2020-11-03: 10 mg via ORAL
  Filled 2020-11-03: qty 3

## 2020-11-03 MED ORDER — LIDOCAINE VISCOUS HCL 2 % MT SOLN
15.0000 mL | Freq: Once | OROMUCOSAL | Status: AC
  Administered 2020-11-03: 15 mL via OROMUCOSAL
  Filled 2020-11-03: qty 15

## 2020-11-03 NOTE — ED Triage Notes (Signed)
Pt to er, pt c/o a sore throat, pt states that she started having pain Tuesday night.  Denies cough, states that it hurts to swallow, states that she also has some ear pain and a headache.

## 2020-11-03 NOTE — ED Triage Notes (Signed)
Pt R tonsil appears to be more swollen than the left.  resps even and unlabored, pt states that it hurts to talk.

## 2020-11-03 NOTE — ED Provider Notes (Signed)
[facility] EMERGENCY DEPARTMENT Provider Note   CSN: [id number] Arrival date & time: 11/03/20  0158     History Chief Complaint  Patient presents with  . Sore Throat    Angela Massey is a 45 y.o. female.   Sore Throat This is a recurrent problem. The current episode started 2 days ago. The problem occurs constantly. The problem has not changed since onset.Associated symptoms include headaches. Pertinent negatives include no chest pain and no shortness of breath. Nothing aggravates the symptoms. Nothing relieves the symptoms.       Past Medical History:  Diagnosis Date  . Anxiety   . Arthritis    back and ankles  . Depression   . Fatty liver   . Fibromyalgia   . Hypertension   . Hypertriglyceridemia   . Ovarian abscess   . PCOS (polycystic ovarian syndrome)   . Pseudotumor   . Sciatica     Patient Active Problem List   Diagnosis Date Noted  . MDD (major depressive disorder), recurrent episode, severe ([place]) 05/11/2020  . MDD (major depressive disorder), recurrent episode, moderate ([place]) 10/11/2015  . GAD (generalized anxiety disorder) 10/11/2015  . Panic disorder without agoraphobia 10/11/2015  . Insomnia 10/11/2015  . Hyperprolactinemia ([place]) 05/26/2013  . Depression, major 05/26/2013    Past Surgical History:  Procedure Laterality Date  . Diagnostic Laparotomy    . planter facitis    . TUBAL LIGATION       OB History    Gravida  4   Para  3   Term  3   Preterm      AB      Living  3     SAB      IAB      Ectopic      Multiple      Live Births              Family History  Problem Relation Age of Onset  . Diabetes Maternal Grandmother   . Depression Mother   . Anxiety disorder Mother   . Alcohol abuse Father     Social History   Tobacco Use  . Smoking status: Current Some Day Smoker    Packs/day: 0.25    Years: 10.00    Pack years: 2.50    Types: Cigarettes    Last attempt to quit: 04/17/2017    Years since  quitting: 3.5  . Smokeless tobacco: Never Used  Vaping Use  . Vaping Use: Every day  . Substances: Nicotine  Substance Use Topics  . Alcohol use: Not Currently    Alcohol/week: 0.0 standard drinks  . Drug use: No    Home Medications Prior to Admission medications   Medication Sig Start Date End Date Taking? Authorizing Provider  amoxicillin (AMOXIL) 250 MG/5ML suspension Take 20 mLs (1,000 mg total) by mouth 2 (two) times daily for 10 days. 11/03/20 11/13/20 Yes Bronte Kropf, Corene Cornea, MD  lidocaine (XYLOCAINE) 2 % solution Use as directed 15 mLs in the mouth or throat every 4 (four) hours as needed for mouth pain. 11/03/20  Yes Kostantinos Tallman, Corene Cornea, MD  acetaminophen (TYLENOL) 650 MG CR tablet Take 1,300 mg by mouth every 8 (eight) hours as needed.    [provider]  aspirin-acetaminophen-caffeine (EXCEDRIN MIGRAINE) [phone] MG tablet Take 1 tablet by mouth every 6 (six) hours as needed for headache.    [provider]  baclofen (LIORESAL) 10 MG tablet Take 10 mg by mouth at bedtime.  [provider]  calcium carbonate (OS-CAL) 1250 (500 Ca) MG chewable tablet Chew 2 tablets by mouth daily.    [provider]  Cholecalciferol (VITAMIN D3) 75 MCG (3000 UT) TABS Take 9,000 Units by mouth daily.    [provider]  diphenhydrAMINE (BENADRYL) 25 mg capsule Take 25 mg by mouth every 6 (six) hours as needed for allergies.    [provider]  docusate sodium (COLACE) 100 MG capsule Take 100 mg by mouth daily as needed for mild constipation.    [provider]  DULoxetine (CYMBALTA) 60 MG capsule Take 2 capsules (120 mg total) by mouth at bedtime. Patient taking differently: Take 120 mg by mouth every morning. 05/13/20   Cristofano, Dorene Ar, MD  FLUoxetine (PROZAC) 10 MG capsule Take 3 capsules (30 mg total) by mouth daily. Patient taking differently: Take 10 mg by mouth daily. 05/14/20   Cristofano, Dorene Ar, MD  FLUoxetine (PROZAC) 20 MG capsule Take  20 mg by mouth daily.    [provider]  gabapentin (NEURONTIN) 300 MG capsule Take 600 mg by mouth 3 (three) times daily.  07/05/18   [provider]  IRON-VITAMIN C PO Take 3 tablets by mouth daily.    [provider]  lamoTRIgine (LAMICTAL) 100 MG tablet Take 300 mg by mouth daily.    [provider]  Magnesium Citrate 200 MG TABS Take 200 mg by mouth daily.    [provider]  Melatonin 5 MG CHEW Chew 15 mg by mouth at bedtime.    [provider]  Multiple Vitamin (STRESS FORMULA PO) Take 2 tablets by mouth daily.    [provider]  Multiple Vitamins-Minerals (HAIR/SKIN/NAILS/BIOTIN) TABS Take 2 each by mouth daily.    [provider]  Multiple Vitamins-Minerals (MULTIVITAMIN WITH MINERALS) tablet Take 2 tablets by mouth daily.    [provider]  omeprazole (PRILOSEC) 40 MG capsule Take 40 mg by mouth daily.    [provider]  Probiotic Product (PROBIOTIC DAILY PO) Take 2 capsules by mouth daily.    [provider]  propranolol (INDERAL) 10 MG tablet Take 10 mg by mouth daily. 10/05/19   [provider]  rOPINIRole (REQUIP) 0.25 MG tablet Take 0.25 mg by mouth at bedtime.    [provider]  vitamin B-12 (CYANOCOBALAMIN) 1000 MCG tablet Take 1,000 mcg by mouth daily.    [provider]    Allergies    Codeine, Phenergan [promethazine hcl], Seroquel [quetiapine fumarate], and Sulfa antibiotics  Review of Systems   Review of Systems  Respiratory: Negative for shortness of breath.   Cardiovascular: Negative for chest pain.  Neurological: Positive for headaches.  All other systems reviewed and are negative.   Physical Exam Updated Vital Signs BP 106/74 (BP Location: Left Arm)   Pulse 84   Temp 98.5 F (36.9 C) (Oral)   Resp 18   Ht 5\' 1"  (1.549 m)   Wt 90.3 kg   SpO2 99%   BMI 37.60 kg/m   Physical Exam Vitals and nursing note reviewed.   Constitutional:      Appearance: She is well-developed.  HENT:     Head: Normocephalic and atraumatic.     Right Ear: No tenderness. No middle ear effusion. Tympanic membrane is not erythematous.     Left Ear: No tenderness. A middle ear effusion is present. Tympanic membrane is erythematous.     Mouth/Throat:     Mouth: Mucous membranes are moist.  Cardiovascular:     Rate and Rhythm: Normal rate and regular rhythm.  Pulmonary:     Effort: No respiratory distress.     Breath sounds: No stridor.  Abdominal:     General: There is no distension.  Musculoskeletal:     Cervical back: Normal range of motion.  Neurological:     Mental Status: She is alert.     ED Results / Procedures / Treatments   Labs (all labs ordered are listed, but only abnormal results are displayed) Labs Reviewed - No data to display  EKG None  Radiology No results found.  Procedures Procedures   Medications Ordered in ED Medications  ketorolac (TORADOL) injection 60 mg (60 mg Intramuscular Given 11/03/20 0440)  dexamethasone (DECADRON) tablet 10 mg (10 mg Oral Given 11/03/20 0439)  lidocaine (XYLOCAINE) 2 % viscous mouth solution 15 mL (15 mLs Mouth/Throat Given 11/03/20 0440)  amoxicillin (AMOXIL) capsule 1,000 mg (1,000 mg Oral Given 11/03/20 0439)    ED Course  I have reviewed the triage vital signs and the nursing notes.  Pertinent labs & imaging results that were available during my care of the patient were reviewed by me and considered in my medical decision making (see chart for details).    MDM Rules/Calculators/A&P                          Likely L AOM. Will start abx which should cover if she happened to have strep throat. Nursing note states asymmetric swelling, but appears to be symmetrical to me. Does have exudate on R>L side though. abx started. Otherwise symptomatidc care.   Final Clinical Impression(s) / ED Diagnoses Final diagnoses:  Pharyngitis, unspecified etiology   Non-recurrent acute suppurative otitis media of right ear without spontaneous rupture of tympanic membrane    Rx / DC Orders ED Discharge Orders         Ordered    amoxicillin (AMOXIL) 250 MG/5ML suspension  2 times daily        11/03/20 0417    lidocaine (XYLOCAINE) 2 % solution  Every 4 hours PRN        11/03/20 0417           Kealie Barrie, Corene Cornea, MD 11/03/20 3810

## 2020-12-20 ENCOUNTER — Ambulatory Visit: Payer: Medicare Other | Admitting: Diagnostic Neuroimaging

## 2020-12-20 ENCOUNTER — Encounter: Payer: Self-pay | Admitting: Diagnostic Neuroimaging

## 2020-12-20 VITALS — BP 121/78 | HR 70 | Ht 61.75 in | Wt 200.0 lb

## 2020-12-20 DIAGNOSIS — G932 Benign intracranial hypertension: Secondary | ICD-10-CM | POA: Diagnosis not present

## 2020-12-20 DIAGNOSIS — R51 Headache with orthostatic component, not elsewhere classified: Secondary | ICD-10-CM | POA: Diagnosis not present

## 2020-12-20 MED ORDER — ALPRAZOLAM 0.5 MG PO TABS: ORAL_TABLET | ORAL | 0 refills | Status: DC

## 2020-12-20 MED ORDER — ACETAZOLAMIDE 250 MG PO TABS: 250.0000 mg | ORAL_TABLET | Freq: Two times a day (BID) | ORAL | 12 refills | Status: DC

## 2020-12-20 NOTE — Progress Notes (Signed)
[facility]  PATIENT: Angela Massey DOB: 02-24-1976  REFERRING CLINICIAN: [place] [facility]* HISTORY FROM: patient  REASON FOR VISIT: new consult    HISTORICAL  CHIEF COMPLAINT:  Chief Complaint  Patient presents with   New Patient (Initial Visit)    RM 6, alone. Says she has been dx with pseudotumor cerebri. Had LP years ago. Has not been on meds for pseudotumor.    HISTORY OF PRESENT ILLNESS:   45 year old female here for evaluation of pseudotumor cerebri.    Age 68 years old patient had routine eye exam found to have papilledema on exam.  She had MRI and LP which showed elevated opening pressure.  Reports that she lost insurance and did not follow-up.  More recently patient has had insurance and followed up with eye exam which showed progression of papilledema and nerve damage.  She was referred here for further evaluation.  Patient had significant obesity but what to [place] to get gastric sleeve surgery in May 2021.  Since that time she has had 130 pounds of weight loss.  Also having ongoing headaches with pressure sensation.  No nausea or vomiting.  No sensitive light or sound.  No transient visual obscuration.  No tinnitus.    REVIEW OF SYSTEMS: Full 14 system review of systems performed and negative with exception of: as per HPI.    ALLERGIES: Allergies  Allergen Reactions   Codeine Hives   Phenergan [Promethazine Hcl]     convulsions   Seroquel [Quetiapine Fumarate]     Caused severe hypotension   Sulfa Antibiotics Hives    HOME MEDICATIONS: Outpatient Medications Prior to Visit  Medication Sig Dispense Refill   acetaminophen (TYLENOL) 650 MG CR tablet Take 1,300 mg by mouth every 8 (eight) hours as needed.     aspirin-acetaminophen-caffeine (EXCEDRIN MIGRAINE) 250-250-65 MG tablet Take 1 tablet by mouth every 6 (six) hours as needed for headache.     baclofen (LIORESAL) 10 MG tablet Take 10 mg by mouth at bedtime.     calcium  carbonate (OS-CAL) 1250 (500 Ca) MG chewable tablet Chew 2 tablets by mouth daily.     Cholecalciferol (VITAMIN D3) 75 MCG (3000 UT) TABS Take 9,000 Units by mouth daily.     diphenhydrAMINE (BENADRYL) 25 mg capsule Take 25 mg by mouth every 6 (six) hours as needed for allergies.     DULoxetine (CYMBALTA) 60 MG capsule Take 2 capsules (120 mg total) by mouth at bedtime. (Patient taking differently: Take 120 mg by mouth every morning.) 60 capsule 0   FLUoxetine (PROZAC) 40 MG capsule Take 40 mg by mouth daily.     gabapentin (NEURONTIN) 800 MG tablet Take 800 mg by mouth 3 (three) times daily.     IRON-VITAMIN C PO Take 3 tablets by mouth daily.     lamoTRIgine (LAMICTAL) 100 MG tablet Take 300 mg by mouth daily.     lidocaine (XYLOCAINE) 2 % solution Use as directed 15 mLs in the mouth or throat every 4 (four) hours as needed for mouth pain. 300 mL 0   Magnesium Citrate 200 MG TABS Take 200 mg by mouth daily.     Melatonin 5 MG CHEW Chew 15 mg by mouth at bedtime.     Multiple Vitamin (STRESS FORMULA PO) Take 2 tablets by mouth daily.     Multiple Vitamins-Minerals (HAIR/SKIN/NAILS/BIOTIN) TABS Take 2 each by mouth daily.     Multiple Vitamins-Minerals (MULTIVITAMIN WITH MINERALS) tablet Take 2 tablets by mouth  daily.     omeprazole (PRILOSEC) 40 MG capsule Take 40 mg by mouth daily.     Probiotic Product (PROBIOTIC DAILY PO) Take 2 capsules by mouth daily.     propranolol (INDERAL) 10 MG tablet Take 10 mg by mouth daily.     rOPINIRole (REQUIP) 0.5 MG tablet Take 0.5 mg by mouth at bedtime.     vitamin B-12 (CYANOCOBALAMIN) 1000 MCG tablet Take 1,000 mcg by mouth daily.     docusate sodium (COLACE) 100 MG capsule Take 100 mg by mouth daily as needed for mild constipation.     FLUoxetine (PROZAC) 10 MG capsule Take 3 capsules (30 mg total) by mouth daily. (Patient taking differently: Take 10 mg by mouth daily.) 90 capsule 0   FLUoxetine (PROZAC) 20 MG capsule Take 20 mg by mouth daily.      gabapentin (NEURONTIN) 300 MG capsule Take 600 mg by mouth 3 (three) times daily.      rOPINIRole (REQUIP) 0.25 MG tablet Take 0.25 mg by mouth at bedtime.     No facility-administered medications prior to visit.    PAST MEDICAL HISTORY: Past Medical History:  Diagnosis Date   Anxiety    Arthritis    back and ankles   Depression    Fatty liver    Fibromyalgia    Hypertension    Hypertriglyceridemia    Ovarian abscess    PCOS (polycystic ovarian syndrome)    Pseudotumor    Sciatica     PAST SURGICAL HISTORY: Past Surgical History:  Procedure Laterality Date   Diagnostic Laparotomy     planter facitis     TUBAL LIGATION      FAMILY HISTORY: Family History  Problem Relation Age of Onset   Diabetes Maternal Grandmother    Depression Mother    Anxiety disorder Mother    Alcohol abuse Father     SOCIAL HISTORY: Social History   Socioeconomic History   Marital status: Married    Spouse name: Not on file   Number of children: 6   Years of education: 14   Highest education level: Not on file  Occupational History   Occupation: Best boy: [employer]  Tobacco Use   Smoking status: Some Days    Packs/day: 0.25    Years: 10.00    Pack years: 2.50    Types: Cigarettes    Last attempt to quit: 04/17/2017    Years since quitting: 3.6   Smokeless tobacco: Never  Vaping Use   Vaping Use: Every day   Substances: Nicotine  Substance and Sexual Activity   Alcohol use: Not Currently    Alcohol/week: 0.0 standard drinks   Drug use: No   Sexual activity: Yes    Partners: Male    Birth control/protection: Surgical  Other Topics Concern   Not on file  Social History Narrative   Born in [place] and raised there until 10 by mom and step dad. Biological parents divorced when she was 21mo. She then moved to [place] states. At 45yo she moved to [place] by herself for [employer] and then moved to [place] and lived there for 65ys. Pt has one older brother and one  older sister. Pt has an associates degree in Energy manager. 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 Pt has been married 3x. Current marriage for 10 yrs and has 3 biological kids and 3 adopted kids.    Social Determinants of Health  Financial Resource Strain: Not on file  Food Insecurity: Not on file  Transportation Needs: Not on file  Physical Activity: Not on file  Stress: Not on file  Social Connections: Not on file  Intimate Partner Violence: Not on file     PHYSICAL EXAM  GENERAL EXAM/CONSTITUTIONAL: Vitals:  Vitals:   12/20/20 1616  BP: 121/78  Pulse: 70  Weight: 200 lb (90.7 kg)  Height: 5' 1.75" (1.568 m)   Body mass index is 36.88 kg/m. Wt Readings from Last 3 Encounters:  12/20/20 200 lb (90.7 kg)  11/03/20 199 lb (90.3 kg)  09/26/20 203 lb (92.1 kg)   Patient is in no distress; well developed, nourished and groomed; neck is supple  CARDIOVASCULAR: Examination of carotid arteries is normal; no carotid bruits Regular rate and rhythm, no murmurs Examination of peripheral vascular system by observation and palpation is normal  EYES: Ophthalmoscopic exam of optic discs and posterior segments is normal; no papilledema or hemorrhages No results found.  MUSCULOSKELETAL: Gait, strength, tone, movements noted in Neurologic exam below  NEUROLOGIC: MENTAL STATUS:  No flowsheet data found. awake, alert, oriented to person, place and time recent and remote memory intact normal attention and concentration language fluent, comprehension intact, naming intact fund of knowledge appropriate  CRANIAL NERVE:  2nd - MILD PAPILLEDEMA 2nd, 3rd, 4th, 6th - pupils equal and reactive to light, visual fields full to confrontation, extraocular muscles intact, no nystagmus 5th - facial sensation symmetric 7th - facial strength symmetric 8th - hearing intact 9th - palate elevates symmetrically, uvula midline 11th - shoulder shrug symmetric 12th -  tongue protrusion midline  MOTOR:  normal bulk and tone, full strength in the BUE, BLE  SENSORY:  normal and symmetric to light touch, temperature, vibration  COORDINATION:  finger-nose-finger, fine finger movements normal  REFLEXES:  deep tendon reflexes TRACE and symmetric  GAIT/STATION:  narrow based gait     DIAGNOSTIC DATA (LABS, IMAGING, TESTING) - I reviewed patient records, labs, notes, testing and imaging myself where available.  Lab Results  Component Value Date   WBC 14.5 (H) 05/12/2020   HGB 12.9 05/12/2020   HCT 40.7 05/12/2020   MCV 81.4 05/12/2020   PLT 327 05/12/2020      Component Value Date/Time   NA 140 05/12/2020 0617   K 3.6 05/12/2020 0617   CL 105 05/12/2020 0617   CO2 27 05/12/2020 0617   GLUCOSE 84 05/12/2020 0617   BUN 11 05/12/2020 0617   CREATININE 0.60 05/12/2020 0617   CALCIUM 9.2 05/12/2020 0617   PROT 7.2 05/12/2020 0617   ALBUMIN 3.6 05/12/2020 0617   AST 18 05/12/2020 0617   ALT 17 05/12/2020 0617   ALKPHOS 79 05/12/2020 0617   BILITOT 0.2 (L) 05/12/2020 0617   GFRNONAA >60 05/12/2020 0617   GFRAA >60 12/08/2019 0026   Lab Results  Component Value Date   CHOL 193 05/12/2020   HDL 42 05/12/2020   LDLCALC 126 (H) 05/12/2020   TRIG 127 05/12/2020   CHOLHDL 4.6 05/12/2020   Lab Results  Component Value Date   HGBA1C 5.4 05/12/2020   No results found for: [id number] Lab Results  Component Value Date   TSH 1.141 05/12/2020     10/17/19 CT head  1. No acute intracranial abnormality. 2. Right maxillary sinus opacification, new from September 2020 may represent a mucous retention cyst  or sinusitis.    ASSESSMENT AND PLAN  45 y.o. year old female here with:  Dx:  1. IIH (  idiopathic intracranial hypertension)   2. Orthostatic headache       PLAN:  - start acetazolamide 250mg  twice a day (caution with sulfa drug reaction in past, but low cross reactivity) - check MRI brain; then consider LP  Orders  Placed This Encounter  Procedures   MR BRAIN W WO CONTRAST   Meds ordered this encounter  Medications   acetaZOLAMIDE (DIAMOX) 250 MG tablet    Sig: Take 1 tablet (250 mg total) by mouth 2 (two) times daily.    Dispense:  60 tablet    Refill:  12   ALPRAZolam (XANAX) 0.5 MG tablet    Sig: for sedation before MRI scan; take 1 tab 1 hour before scan; may repeat 1 tab 15 min before scan    Dispense:  3 tablet    Refill:  0   Return in about 4 months (around 04/21/2021).    Penni Bombard, MD 0/69/8614, 8:30 PM Certified in Neurology, Neurophysiology and Neuroimaging  [facility] [address] [place], [place] [address] [phone]

## 2020-12-20 NOTE — Patient Instructions (Signed)
-   start acetazolamide 250mg  twice a day (caution with sulfa drug reaction in past, but low cross reactivity)  - check MRI brain; then consider LP

## 2020-12-21 ENCOUNTER — Telehealth: Payer: Self-pay | Admitting: Diagnostic Neuroimaging

## 2020-12-21 NOTE — Telephone Encounter (Signed)
MRI brain w/wo contrast [employer] medicare auth: NPR ref # [id number]  Sent to GI for scheduling

## 2020-12-27 ENCOUNTER — Ambulatory Visit: Payer: Medicare Other | Admitting: Neurology

## 2021-01-03 ENCOUNTER — Other Ambulatory Visit: Payer: Self-pay

## 2021-01-03 ENCOUNTER — Emergency Department (HOSPITAL_COMMUNITY)
Admission: EM | Admit: 2021-01-03 | Discharge: 2021-01-03 | Disposition: A | Discharge status: Home / Self Care | Payer: Medicare Other | Attending: Emergency Medicine | Admitting: Emergency Medicine

## 2021-01-03 ENCOUNTER — Encounter (HOSPITAL_COMMUNITY): Payer: Self-pay | Admitting: *Deleted

## 2021-01-03 DIAGNOSIS — Z20822 Contact with and (suspected) exposure to covid-19: Secondary | ICD-10-CM | POA: Diagnosis not present

## 2021-01-03 DIAGNOSIS — F17210 Nicotine dependence, cigarettes, uncomplicated: Secondary | ICD-10-CM | POA: Insufficient documentation

## 2021-01-03 DIAGNOSIS — Z79899 Other long term (current) drug therapy: Secondary | ICD-10-CM | POA: Insufficient documentation

## 2021-01-03 DIAGNOSIS — Z7982 Long term (current) use of aspirin: Secondary | ICD-10-CM | POA: Diagnosis not present

## 2021-01-03 DIAGNOSIS — I10 Essential (primary) hypertension: Secondary | ICD-10-CM | POA: Insufficient documentation

## 2021-01-03 DIAGNOSIS — G8929 Other chronic pain: Secondary | ICD-10-CM | POA: Insufficient documentation

## 2021-01-03 DIAGNOSIS — R5383 Other fatigue: Secondary | ICD-10-CM | POA: Insufficient documentation

## 2021-01-03 LAB — BASIC METABOLIC PANEL
Anion gap: 7 (ref 5–15)
BUN: 31 mg/dL — ABNORMAL HIGH (ref 6–20)
CO2: 23 mmol/L (ref 22–32)
Calcium: 8.9 mg/dL (ref 8.9–10.3)
Chloride: 107 mmol/L (ref 98–111)
Creatinine, Ser: 0.67 mg/dL (ref 0.44–1.00)
GFR, Estimated: >60 mL/min (ref >60)
Glucose, Bld: 84 mg/dL (ref 70–99)
Potassium: 3.6 mmol/L (ref 3.5–5.1)
Sodium: 137 mmol/L (ref 135–145)

## 2021-01-03 LAB — CBC WITH DIFFERENTIAL/PLATELET
Abs Immature Granulocytes: 0.04 10*3/uL (ref 0.00–0.07)
Basophils Absolute: 0 10*3/uL (ref 0.0–0.1)
Basophils Relative: 0 %
Eosinophils Absolute: 0.1 10*3/uL (ref 0.0–0.5)
Eosinophils Relative: 1 %
HCT: 39.8 % (ref 36.0–46.0)
Hemoglobin: 12.8 g/dL (ref 12.0–15.0)
Immature Granulocytes: 0 %
Lymphocytes Relative: 28 %
Lymphs Abs: 2.8 10*3/uL (ref 0.7–4.0)
MCH: 28.4 pg (ref 26.0–34.0)
MCHC: 32.2 g/dL (ref 30.0–36.0)
MCV: 88.2 fL (ref 80.0–100.0)
Monocytes Absolute: 0.6 10*3/uL (ref 0.1–1.0)
Monocytes Relative: 6 %
Neutro Abs: 6.5 10*3/uL (ref 1.7–7.7)
Neutrophils Relative %: 65 %
Platelets: 283 10*3/uL (ref 150–400)
RBC: 4.51 MIL/uL (ref 3.87–5.11)
RDW: 12.9 % (ref 11.5–15.5)
WBC: 10.1 10*3/uL (ref 4.0–10.5)
nRBC: 0 % (ref 0.0–0.2)

## 2021-01-03 LAB — URINALYSIS, ROUTINE W REFLEX MICROSCOPIC
Bilirubin Urine: NEGATIVE
Glucose, UA: NEGATIVE mg/dL
Hgb urine dipstick: NEGATIVE
Ketones, ur: NEGATIVE mg/dL
Leukocytes,Ua: NEGATIVE
Nitrite: NEGATIVE
Protein, ur: NEGATIVE mg/dL
Specific Gravity, Urine: 1.023 (ref 1.005–1.030)
pH: 5 (ref 5.0–8.0)

## 2021-01-03 LAB — TSH: TSH: 0.588 u[IU]/mL (ref 0.350–4.500)

## 2021-01-03 LAB — PREGNANCY, URINE: Preg Test, Ur: NEGATIVE

## 2021-01-03 MED ORDER — SODIUM CHLORIDE 0.9 % IV BOLUS
1000.0000 mL | Freq: Once | INTRAVENOUS | Status: AC
  Administered 2021-01-03: 1000 mL via INTRAVENOUS

## 2021-01-03 NOTE — ED Notes (Signed)
ED Provider at bedside. 

## 2021-01-03 NOTE — ED Triage Notes (Signed)
C/o weakness and fatigue for over a week. Patient takes multiple meds for chronic pain, states her meds are not controlling her pain

## 2021-01-03 NOTE — ED Provider Notes (Signed)
[facility] Provider Note   CSN: [id number] Arrival date & time: 01/03/21  1220     History Chief Complaint  Patient presents with   Fatigue    Angela Massey is a 45 y.o. female.  HPI Patient presents to the ER today as a [age]-year-old female with past medical history significant for of anxiety, depression, fibromyalgia, HTN, HLD, PCOS, sciatica  She states that she has no pain apart from her chronic pain.  States that she has had weakness and fatigue over the past week.  She states that her symptoms seem to have been consistent.  She states she has tried any new medications for her symptoms.  Denies any chest pain shortness breath lightheadedness or dizziness.  No nausea vomiting or diarrhea.  No fevers or chills.  She states that she just feels low energy.  Patient states that she feels that her depression is relatively well controlled although she feels more depressed recently because she feels that her pain which is chronic in nature has not been well controlled.  She states that she does feel safe at home denies any SI, HI, AVH.     Past Medical History:  Diagnosis Date   Anxiety    Arthritis    back and ankles   Depression    Fatty liver    Fibromyalgia    Hypertension    Hypertriglyceridemia    Ovarian abscess    PCOS (polycystic ovarian syndrome)    Pseudotumor    Sciatica     Patient Active Problem List   Diagnosis Date Noted   MDD (major depressive disorder), recurrent episode, severe ([place]) 05/11/2020   MDD (major depressive disorder), recurrent episode, moderate ([place]) 10/11/2015   GAD (generalized anxiety disorder) 10/11/2015   Panic disorder without agoraphobia 10/11/2015   Insomnia 10/11/2015   Hyperprolactinemia ([place]) 05/26/2013   Depression, major 05/26/2013    Past Surgical History:  Procedure Laterality Date   Diagnostic Laparotomy     planter facitis     TUBAL LIGATION       OB History     Gravida  4   Para  3    Term  3   Preterm      AB      Living  3      SAB      IAB      Ectopic      Multiple      Live Births              Family History  Problem Relation Age of Onset   Diabetes Maternal Grandmother    Depression Mother    Anxiety disorder Mother    Alcohol abuse Father     Social History   Tobacco Use   Smoking status: Some Days    Packs/day: 0.25    Years: 10.00    Pack years: 2.50    Types: Cigarettes    Last attempt to quit: 04/17/2017    Years since quitting: 3.7   Smokeless tobacco: Never  Vaping Use   Vaping Use: Every day   Substances: Nicotine  Substance Use Topics   Alcohol use: Not Currently    Alcohol/week: 0.0 standard drinks   Drug use: No    Home Medications Prior to Admission medications   Medication Sig Start Date End Date Taking? Authorizing Provider  acetaminophen (TYLENOL) 650 MG CR tablet Take 1,300 mg by mouth every 8 (eight) hours as needed.    [provider]  acetaZOLAMIDE (DIAMOX) 250 MG tablet Take 1 tablet (250 mg total) by mouth 2 (two) times daily. 12/20/20   Penumalli, Earlean Polka, MD  ALPRAZolam Duanne Moron) 0.5 MG tablet for sedation before MRI scan; take 1 tab 1 hour before scan; may repeat 1 tab 15 min before scan Patient not taking: Reported on 01/03/2021 12/20/20   Penumalli, Earlean Polka, MD  aspirin-acetaminophen-caffeine (EXCEDRIN MIGRAINE) [phone] MG tablet Take 1 tablet by mouth every 6 (six) hours as needed for headache.    [provider]  baclofen (LIORESAL) 10 MG tablet Take 10 mg by mouth at bedtime.    [provider]  calcium carbonate (OS-CAL) 1250 (500 Ca) MG chewable tablet Chew 2 tablets by mouth daily.    [provider]  Cholecalciferol (VITAMIN D3) 75 MCG (3000 UT) TABS Take 9,000 Units by mouth daily.    [provider]  diphenhydrAMINE (BENADRYL) 25 mg capsule Take 25 mg by mouth every 6 (six) hours as needed for allergies.    [provider]  DULoxetine  (CYMBALTA) 60 MG capsule Take 2 capsules (120 mg total) by mouth at bedtime. Patient taking differently: Take 120 mg by mouth every morning. 05/13/20   Cristofano, Dorene Ar, MD  FLUoxetine (PROZAC) 40 MG capsule Take 40 mg by mouth daily.    [provider]  gabapentin (NEURONTIN) 800 MG tablet Take 800 mg by mouth 3 (three) times daily.    [provider]  IRON-VITAMIN C PO Take 3 tablets by mouth daily.    [provider]  lamoTRIgine (LAMICTAL) 100 MG tablet Take 300 mg by mouth daily.    [provider]  lidocaine (XYLOCAINE) 2 % solution Use as directed 15 mLs in the mouth or throat every 4 (four) hours as needed for mouth pain. 11/03/20   Mesner, Corene Cornea, MD  Magnesium Citrate 200 MG TABS Take 200 mg by mouth daily.    [provider]  Melatonin 5 MG CHEW Chew 15 mg by mouth at bedtime.    [provider]  Multiple Vitamin (STRESS FORMULA PO) Take 2 tablets by mouth daily.    [provider]  Multiple Vitamins-Minerals (HAIR/SKIN/NAILS/BIOTIN) TABS Take 2 each by mouth daily.    [provider]  Multiple Vitamins-Minerals (MULTIVITAMIN WITH MINERALS) tablet Take 2 tablets by mouth daily.    [provider]  omeprazole (PRILOSEC) 40 MG capsule Take 40 mg by mouth daily.    [provider]  Probiotic Product (PROBIOTIC DAILY PO) Take 2 capsules by mouth daily.    [provider]  propranolol (INDERAL) 10 MG tablet Take 10 mg by mouth daily. 10/05/19   [provider]  rOPINIRole (REQUIP) 0.5 MG tablet Take 0.5 mg by mouth at bedtime.    [provider]  vitamin B-12 (CYANOCOBALAMIN) 1000 MCG tablet Take 1,000 mcg by mouth daily.    [provider]    Allergies    Codeine, Phenergan [promethazine hcl], Seroquel [quetiapine fumarate], and Sulfa antibiotics  Review of Systems   Review of Systems  Constitutional:  Positive for fatigue. Negative for chills and fever.  HENT:   Negative for congestion.   Eyes:  Negative for pain.  Respiratory:  Negative for cough and shortness of breath.   Cardiovascular:  Negative for chest pain and leg swelling.  Gastrointestinal:  Negative for abdominal pain and vomiting.  Genitourinary:  Negative for dysuria.  Musculoskeletal:  Negative for myalgias.  Skin:  Negative for rash.  Neurological:  Negative for dizziness and headaches.   Physical Exam Updated Vital Signs BP (!) 108/59 (BP Location: Right Arm)   Pulse 65   Temp 98.4 F (36.9 C) (Oral)   Resp 18   SpO2 100%   Physical Exam Vitals and nursing note reviewed.  Constitutional:      General: She is not in acute distress. HENT:     Head: Normocephalic and atraumatic.     Nose: Nose normal.     Mouth/Throat:     Mouth: Mucous membranes are moist.  Eyes:     General: No scleral icterus. Cardiovascular:     Rate and Rhythm: Normal rate and regular rhythm.     Pulses: Normal pulses.     Heart sounds: Normal heart sounds.  Pulmonary:     Effort: Pulmonary effort is normal. No respiratory distress.     Breath sounds: No wheezing.  Abdominal:     Palpations: Abdomen is soft.     Tenderness: There is no abdominal tenderness. There is no guarding or rebound.  Musculoskeletal:     Cervical back: Normal range of motion.     Right lower leg: No edema.     Left lower leg: No edema.  Skin:    General: Skin is warm and dry.     Capillary Refill: Capillary refill takes less than 2 seconds.  Neurological:     Mental Status: She is alert. Mental status is at baseline.     Comments: Moves all 4 extremities.  Sensation all 4 extremities. Smile symmetric Grip strength bilaterally 5/5 with 5/5 flexion extension of the elbows. Ambulatory with no abnormal gait  Psychiatric:        Mood and Affect: Mood normal.        Behavior: Behavior normal.    ED Results / Procedures / Treatments   Labs (all labs ordered are listed, but only abnormal results are  displayed) Labs Reviewed  BASIC METABOLIC PANEL - Abnormal; Notable for the following components:      Result Value   BUN 31 (*)    All other components within normal limits  SARS CORONAVIRUS 2 (TAT 6-24 HRS)  CBC WITH DIFFERENTIAL/PLATELET  URINALYSIS, ROUTINE W REFLEX MICROSCOPIC  PREGNANCY, URINE  TSH    EKG None  Radiology No results found.  Procedures Procedures   Medications Ordered in ED Medications  sodium chloride 0.9 % bolus 1,000 mL (0 mLs Intravenous Stopped 01/03/21 1522)    ED Course  I have reviewed the triage vital signs and the nursing notes.  Pertinent labs & imaging results that were available during my care of the patient were reviewed by me and considered in my medical decision making (see chart for details).    MDM Rules/Calculators/A&P                          Patient is a 45 year old female with past medical history significant for depression, chronic pain  Patient is overall very well-appearing.  Physical exam is unremarkable.  CBC unremarkable BMP unremarkable.  Urinalysis without evidence of infection.  COVID test is negative and please adjust as needed.  TSH within normal limits.  Patient received 1 L normal saline and states that she feels much improved.  She is neurologically intact ambulatory time discharge tolerating p.o.  Uncertain etiology of her fatigue will discharge home with conservative therapy and return precautions and she will follow-up with her primary care provider.   Final Clinical Impression(s) /  ED Diagnoses Final diagnoses:  Fatigue, unspecified type    Rx / DC Orders ED Discharge Orders     None        Tedd Sias, [place] 01/04/21 [place], MD 01/10/21 1426

## 2021-01-03 NOTE — Discharge Instructions (Addendum)
Your work-up here in the ER was reassuring today.  Please follow-up on your COVID test.  Please drink plenty of water, take your prescribed occasions and follow-up with your primary care provider.  May return to the ER for any new or concerning symptoms or any other concerns.

## 2021-01-04 LAB — SARS CORONAVIRUS 2 (TAT 6-24 HRS): SARS Coronavirus 2: NEGATIVE

## 2021-01-12 ENCOUNTER — Other Ambulatory Visit: Payer: Self-pay

## 2021-01-12 ENCOUNTER — Ambulatory Visit
Admission: RE | Admit: 2021-01-12 | Discharge: 2021-01-12 | Disposition: A | Discharge status: Home / Self Care | Payer: Medicare Other | Source: Ambulatory Visit | Attending: Diagnostic Neuroimaging | Admitting: Diagnostic Neuroimaging

## 2021-01-12 DIAGNOSIS — G932 Benign intracranial hypertension: Secondary | ICD-10-CM

## 2021-01-12 DIAGNOSIS — R51 Headache with orthostatic component, not elsewhere classified: Secondary | ICD-10-CM

## 2021-01-12 MED ORDER — GADOBENATE DIMEGLUMINE 529 MG/ML IV SOLN
19.0000 mL | Freq: Once | INTRAVENOUS | Status: AC | PRN
  Administered 2021-01-12: 19 mL via INTRAVENOUS

## 2021-01-18 ENCOUNTER — Encounter: Payer: Self-pay | Admitting: *Deleted

## 2021-01-20 ENCOUNTER — Other Ambulatory Visit: Payer: Medicare Other

## 2021-02-28 ENCOUNTER — Other Ambulatory Visit: Payer: Self-pay

## 2021-02-28 ENCOUNTER — Emergency Department (HOSPITAL_COMMUNITY)
Admission: EM | Admit: 2021-02-28 | Discharge: 2021-02-28 | Disposition: A | Discharge status: Home / Self Care | Payer: Managed Care, Other (non HMO) | Attending: Emergency Medicine | Admitting: Emergency Medicine

## 2021-02-28 DIAGNOSIS — M79605 Pain in left leg: Secondary | ICD-10-CM | POA: Insufficient documentation

## 2021-02-28 DIAGNOSIS — M5432 Sciatica, left side: Secondary | ICD-10-CM | POA: Insufficient documentation

## 2021-02-28 DIAGNOSIS — M5442 Lumbago with sciatica, left side: Secondary | ICD-10-CM | POA: Diagnosis present

## 2021-02-28 DIAGNOSIS — Z7982 Long term (current) use of aspirin: Secondary | ICD-10-CM | POA: Insufficient documentation

## 2021-02-28 DIAGNOSIS — Z79899 Other long term (current) drug therapy: Secondary | ICD-10-CM | POA: Insufficient documentation

## 2021-02-28 DIAGNOSIS — F17210 Nicotine dependence, cigarettes, uncomplicated: Secondary | ICD-10-CM | POA: Insufficient documentation

## 2021-02-28 DIAGNOSIS — I10 Essential (primary) hypertension: Secondary | ICD-10-CM | POA: Diagnosis not present

## 2021-02-28 MED ORDER — CYCLOBENZAPRINE HCL 10 MG PO TABS: 10.0000 mg | ORAL_TABLET | Freq: Two times a day (BID) | ORAL | 0 refills | Status: DC | PRN

## 2021-02-28 MED ORDER — IBUPROFEN 600 MG PO TABS: 600.0000 mg | ORAL_TABLET | Freq: Four times a day (QID) | ORAL | 0 refills | Status: DC | PRN

## 2021-02-28 MED ORDER — HYDROMORPHONE HCL 1 MG/ML IJ SOLN
1.0000 mg | Freq: Once | INTRAMUSCULAR | Status: AC
  Administered 2021-02-28: 1 mg via INTRAMUSCULAR
  Filled 2021-02-28: qty 1

## 2021-02-28 MED ORDER — PREDNISONE 20 MG PO TABS: ORAL_TABLET | ORAL | 0 refills | Status: DC

## 2021-02-28 NOTE — Discharge Instructions (Addendum)
Please follow-up closely with your pain specialist for further managements of your sciatic pain.  I also prescribed medication to help ease your symptoms.  You may call and follow-up with neurosurgeon for further managements of your back pain.

## 2021-02-28 NOTE — ED Triage Notes (Signed)
Pt here d/t right back pain radiating down leg. History of sciatica and receiving injections. Pain 10/10.

## 2021-02-28 NOTE — ED Provider Notes (Signed)
Emergency Medicine Provider Triage Evaluation Note  Angela Massey , a 45 y.o. female  was evaluated in triage.  Pt complains of back pain that is chronic but worse over the last few days. Pain located to the left lower back and radiates to the lle.  Review of Systems  Positive: Back pain Negative: fever  Physical Exam  BP 125/67 (BP Location: Left Arm)   Pulse 83   Temp 99.1 F (37.3 C) (Oral)   Resp 18   SpO2 97%  Gen:   Awake, no distress   Resp:  Normal effort  MSK:   Moves extremities without difficulty  Other:   Medical Decision Making  Medically screening exam initiated at 12:09 PM.  Appropriate orders placed.  GER TEIG was informed that the remainder of the evaluation will be completed by another provider, this initial triage assessment does not replace that evaluation, and the importance of remaining in the ED until their evaluation is complete.     Rodney Booze, PA-C 02/28/21 1210    Truddie Hidden, MD 02/28/21 [phone]

## 2021-02-28 NOTE — ED Provider Notes (Signed)
[facility] EMERGENCY DEPARTMENT Provider Note   CSN: [id number] Arrival date & time: 02/28/21  1052     History Chief Complaint  Patient presents with   Back Pain    Angela Massey is a 45 y.o. female.  The history is provided by the patient and medical records. No language interpreter was used.  Back Pain Associated symptoms: no fever and no numbness    45 year old female significant history of fibromyalgia, sciatica, hypertension, anxiety, arthritis presenting complaining of radicular leg pain.  Patient states for the past 6 months she has had recurrent radicular left leg pain.  States pain has been progressively getting worse especially with ambulation or with sitting.  Pain is severe today prompting this ER visit.  No associated fever chills bowel bladder incontinence or saddle anesthesia.  She mention she was seen by her spine specialist and had a spine injection week ago but report no improvement with injection.  She was told that she has significant herniation of the disc and stenosis and she would likely need back surgery.  Due to her progressive worsening pain without relief despite recent management by pain specialist patient decided to come here for further care.  Past Medical History:  Diagnosis Date   Anxiety    Arthritis    back and ankles   Depression    Fatty liver    Fibromyalgia    Hypertension    Hypertriglyceridemia    Ovarian abscess    PCOS (polycystic ovarian syndrome)    Pseudotumor    Sciatica     Patient Active Problem List   Diagnosis Date Noted   MDD (major depressive disorder), recurrent episode, severe ([place]) 05/11/2020   MDD (major depressive disorder), recurrent episode, moderate ([place]) 10/11/2015   GAD (generalized anxiety disorder) 10/11/2015   Panic disorder without agoraphobia 10/11/2015   Insomnia 10/11/2015   Hyperprolactinemia ([place]) 05/26/2013   Depression, major 05/26/2013    Past Surgical History:  Procedure  Laterality Date   Diagnostic Laparotomy     planter facitis     TUBAL LIGATION       OB History     Gravida  4   Para  3   Term  3   Preterm      AB      Living  3      SAB      IAB      Ectopic      Multiple      Live Births              Family History  Problem Relation Age of Onset   Diabetes Maternal Grandmother    Depression Mother    Anxiety disorder Mother    Alcohol abuse Father     Social History   Tobacco Use   Smoking status: Some Days    Packs/day: 0.25    Years: 10.00    Pack years: 2.50    Types: Cigarettes    Last attempt to quit: 04/17/2017    Years since quitting: 3.8   Smokeless tobacco: Never  Vaping Use   Vaping Use: Every day   Substances: Nicotine  Substance Use Topics   Alcohol use: Not Currently    Alcohol/week: 0.0 standard drinks   Drug use: No    Home Medications Prior to Admission medications   Medication Sig Start Date End Date Taking? Authorizing Provider  acetaminophen (TYLENOL) 650 MG CR tablet Take 1,300 mg by mouth every 8 (eight)  hours as needed.    [provider]  acetaZOLAMIDE (DIAMOX) 250 MG tablet Take 1 tablet (250 mg total) by mouth 2 (two) times daily. 12/20/20   Penumalli, Earlean Polka, MD  ALPRAZolam Duanne Moron) 0.5 MG tablet for sedation before MRI scan; take 1 tab 1 hour before scan; may repeat 1 tab 15 min before scan Patient not taking: Reported on 01/03/2021 12/20/20   Penumalli, Earlean Polka, MD  aspirin-acetaminophen-caffeine (EXCEDRIN MIGRAINE) [phone] MG tablet Take 1 tablet by mouth every 6 (six) hours as needed for headache.    [provider]  baclofen (LIORESAL) 10 MG tablet Take 10 mg by mouth at bedtime.    [provider]  calcium carbonate (OS-CAL) 1250 (500 Ca) MG chewable tablet Chew 2 tablets by mouth daily.    [provider]  Cholecalciferol (VITAMIN D3) 75 MCG (3000 UT) TABS Take 9,000 Units by mouth daily.    [provider]  diphenhydrAMINE  (BENADRYL) 25 mg capsule Take 25 mg by mouth every 6 (six) hours as needed for allergies.    [provider]  DULoxetine (CYMBALTA) 60 MG capsule Take 2 capsules (120 mg total) by mouth at bedtime. Patient taking differently: Take 120 mg by mouth every morning. 05/13/20   Cristofano, Dorene Ar, MD  FLUoxetine (PROZAC) 40 MG capsule Take 40 mg by mouth daily.    [provider]  gabapentin (NEURONTIN) 800 MG tablet Take 800 mg by mouth 3 (three) times daily.    [provider]  IRON-VITAMIN C PO Take 3 tablets by mouth daily.    [provider]  lamoTRIgine (LAMICTAL) 100 MG tablet Take 300 mg by mouth daily.    [provider]  lidocaine (XYLOCAINE) 2 % solution Use as directed 15 mLs in the mouth or throat every 4 (four) hours as needed for mouth pain. 11/03/20   Mesner, Corene Cornea, MD  Magnesium Citrate 200 MG TABS Take 200 mg by mouth daily.    [provider]  Melatonin 5 MG CHEW Chew 15 mg by mouth at bedtime.    [provider]  Multiple Vitamin (STRESS FORMULA PO) Take 2 tablets by mouth daily.    [provider]  Multiple Vitamins-Minerals (HAIR/SKIN/NAILS/BIOTIN) TABS Take 2 each by mouth daily.    [provider]  Multiple Vitamins-Minerals (MULTIVITAMIN WITH MINERALS) tablet Take 2 tablets by mouth daily.    [provider]  omeprazole (PRILOSEC) 40 MG capsule Take 40 mg by mouth daily.    [provider]  Probiotic Product (PROBIOTIC DAILY PO) Take 2 capsules by mouth daily.    [provider]  propranolol (INDERAL) 10 MG tablet Take 10 mg by mouth daily. 10/05/19   [provider]  rOPINIRole (REQUIP) 0.5 MG tablet Take 0.5 mg by mouth at bedtime.    [provider]  vitamin B-12 (CYANOCOBALAMIN) 1000 MCG tablet Take 1,000 mcg by mouth daily.    [provider]    Allergies    Codeine, Phenergan [promethazine hcl], Seroquel [quetiapine fumarate], and Sulfa  antibiotics  Review of Systems   Review of Systems  Constitutional:  Negative for fever.  Genitourinary:  Negative for difficulty urinating.  Musculoskeletal:  Positive for back pain.  Neurological:  Negative for numbness.   Physical Exam Updated Vital Signs BP 125/67 (BP Location: Left Arm)   Pulse 83   Temp 99.1 F (37.3 C) (Oral)   Resp 18   Ht '5\' 2"'$  (1.575 m)   Wt 86.2 kg  SpO2 97%   BMI 34.75 kg/m   Physical Exam Vitals and nursing note reviewed.  Constitutional:      General: She is not in acute distress.    Appearance: She is well-developed.  HENT:     Head: Atraumatic.  Eyes:     Conjunctiva/sclera: Conjunctivae normal.  Pulmonary:     Effort: Pulmonary effort is normal.  Abdominal:     Palpations: Abdomen is soft.     Tenderness: There is no abdominal tenderness.  Musculoskeletal:        General: Tenderness (Tenderness along lumbar and left lumbosacral region on palpation without crepitus or step-off.  Positive left straight leg raise.  Intact dorsalis pedis pulse without any foot drops.  Leg compartments soft.  No skin color) present.     Cervical back: Neck supple.  Skin:    Findings: No rash.  Neurological:     Mental Status: She is alert. Mental status is at baseline.  Psychiatric:        Mood and Affect: Mood normal.    ED Results / Procedures / Treatments   Labs (all labs ordered are listed, but only abnormal results are displayed) Labs Reviewed - No data to display  EKG None  Radiology No results found.  Procedures Procedures   Medications Ordered in ED Medications - No data to display  ED Course  I have reviewed the triage vital signs and the nursing notes.  Pertinent labs & imaging results that were available during my care of the patient were reviewed by me and considered in my medical decision making (see chart for details).    MDM Rules/Calculators/A&P                           BP 125/67 (BP Location: Left Arm)   Pulse  83   Temp 99.1 F (37.3 C) (Oral)   Resp 18   Ht '5\' 2"'$  (1.575 m)   Wt 86.2 kg   SpO2 97%   BMI 34.75 kg/m   Final Clinical Impression(s) / ED Diagnoses Final diagnoses:  Sciatica, left side    Rx / DC Orders ED Discharge Orders          Ordered    predniSONE (DELTASONE) 20 MG tablet        02/28/21 1402    cyclobenzaprine (FLEXERIL) 10 MG tablet  2 times daily PRN        02/28/21 1402    ibuprofen (ADVIL) 600 MG tablet  Every 6 hours PRN        02/28/21 1402           Patient report progressive worsening radicular leg pain generating from her lower back consistent with sciatica.  This is an ongoing issue for the past 6 months.  She has been seen and managed by her pain specialist and received treatment without adequate relief.  She reports she canceled her follow-up appointment with her specialist today in order to come to the ER seeking for help.  She does not have any red flags, she is able to ambulate, she does not require emergent surgical intervention at this time.  I offered her steroid nd muscle relaxant but patient states that she has had all of the above without relief.  I will give patient 1 dose of pain medication here and give referral for outpatient follow-up with neurosurgery.  Certainly patient can return if symptoms progress.  Encourage patient to reach out to  her pain specialist for close follow-up as well.   Domenic Moras, PA-C 02/28/21 1458    Truddie Hidden, MD 02/28/21 [phone]

## 2021-03-03 ENCOUNTER — Other Ambulatory Visit: Payer: Self-pay | Admitting: Neurosurgery

## 2021-03-03 DIAGNOSIS — M5416 Radiculopathy, lumbar region: Secondary | ICD-10-CM

## 2021-03-10 ENCOUNTER — Emergency Department (HOSPITAL_COMMUNITY)
Admission: EM | Admit: 2021-03-10 | Discharge: 2021-03-10 | Disposition: A | Discharge status: Home / Self Care | Payer: Managed Care, Other (non HMO) | Attending: Emergency Medicine | Admitting: Emergency Medicine

## 2021-03-10 ENCOUNTER — Emergency Department (HOSPITAL_COMMUNITY): Payer: Managed Care, Other (non HMO)

## 2021-03-10 DIAGNOSIS — M5136 Other intervertebral disc degeneration, lumbar region: Secondary | ICD-10-CM

## 2021-03-10 DIAGNOSIS — M5432 Sciatica, left side: Secondary | ICD-10-CM

## 2021-03-10 DIAGNOSIS — F17210 Nicotine dependence, cigarettes, uncomplicated: Secondary | ICD-10-CM | POA: Insufficient documentation

## 2021-03-10 DIAGNOSIS — M545 Low back pain, unspecified: Secondary | ICD-10-CM | POA: Insufficient documentation

## 2021-03-10 DIAGNOSIS — Z79899 Other long term (current) drug therapy: Secondary | ICD-10-CM | POA: Diagnosis not present

## 2021-03-10 DIAGNOSIS — M5126 Other intervertebral disc displacement, lumbar region: Secondary | ICD-10-CM

## 2021-03-10 DIAGNOSIS — I10 Essential (primary) hypertension: Secondary | ICD-10-CM | POA: Insufficient documentation

## 2021-03-10 LAB — CBC WITH DIFFERENTIAL/PLATELET
Abs Immature Granulocytes: 0.03 10*3/uL (ref 0.00–0.07)
Basophils Absolute: 0.1 10*3/uL (ref 0.0–0.1)
Basophils Relative: 0 %
Eosinophils Absolute: 0.2 10*3/uL (ref 0.0–0.5)
Eosinophils Relative: 2 %
HCT: 40.3 % (ref 36.0–46.0)
Hemoglobin: 12.9 g/dL (ref 12.0–15.0)
Immature Granulocytes: 0 %
Lymphocytes Relative: 32 %
Lymphs Abs: 3.6 10*3/uL (ref 0.7–4.0)
MCH: 28.6 pg (ref 26.0–34.0)
MCHC: 32 g/dL (ref 30.0–36.0)
MCV: 89.4 fL (ref 80.0–100.0)
Monocytes Absolute: 0.7 10*3/uL (ref 0.1–1.0)
Monocytes Relative: 6 %
Neutro Abs: 6.8 10*3/uL (ref 1.7–7.7)
Neutrophils Relative %: 60 %
Platelets: 310 10*3/uL (ref 150–400)
RBC: 4.51 MIL/uL (ref 3.87–5.11)
RDW: 13.1 % (ref 11.5–15.5)
WBC: 11.3 10*3/uL — ABNORMAL HIGH (ref 4.0–10.5)
nRBC: 0 % (ref 0.0–0.2)

## 2021-03-10 LAB — BASIC METABOLIC PANEL
Anion gap: 6 (ref 5–15)
BUN: 16 mg/dL (ref 6–20)
CO2: 31 mmol/L (ref 22–32)
Calcium: 8.5 mg/dL — ABNORMAL LOW (ref 8.9–10.3)
Chloride: 101 mmol/L (ref 98–111)
Creatinine, Ser: 0.53 mg/dL (ref 0.44–1.00)
GFR, Estimated: >60 mL/min (ref >60)
Glucose, Bld: 81 mg/dL (ref 70–99)
Potassium: 4.1 mmol/L (ref 3.5–5.1)
Sodium: 138 mmol/L (ref 135–145)

## 2021-03-10 MED ORDER — DEXAMETHASONE SODIUM PHOSPHATE 10 MG/ML IJ SOLN
10.0000 mg | Freq: Once | INTRAMUSCULAR | Status: AC
  Administered 2021-03-10: 10 mg via INTRAVENOUS
  Filled 2021-03-10: qty 1

## 2021-03-10 MED ORDER — KETOROLAC TROMETHAMINE 30 MG/ML IJ SOLN
30.0000 mg | Freq: Once | INTRAMUSCULAR | Status: AC
  Administered 2021-03-10: 30 mg via INTRAVENOUS
  Filled 2021-03-10: qty 1

## 2021-03-10 MED ORDER — CYCLOBENZAPRINE HCL 10 MG PO TABS: 5.0000 mg | ORAL_TABLET | Freq: Two times a day (BID) | ORAL | 0 refills | Status: DC | PRN

## 2021-03-10 MED ORDER — LORAZEPAM 2 MG/ML IJ SOLN
1.0000 mg | Freq: Once | INTRAMUSCULAR | Status: AC | PRN
  Administered 2021-03-10: 1 mg via INTRAVENOUS
  Filled 2021-03-10: qty 1

## 2021-03-10 MED ORDER — METHYLPREDNISOLONE 4 MG PO TBPK: ORAL_TABLET | ORAL | 0 refills | Status: DC

## 2021-03-10 MED ORDER — MELOXICAM 15 MG PO TABS: 15.0000 mg | ORAL_TABLET | Freq: Every day | ORAL | 0 refills | Status: DC

## 2021-03-10 MED ORDER — FENTANYL CITRATE PF 50 MCG/ML IJ SOSY: 50.0000 ug | PREFILLED_SYRINGE | Freq: Once | INTRAMUSCULAR | Status: DC

## 2021-03-10 NOTE — ED Triage Notes (Signed)
Pt hx sciatica, on pain regiment. Recently, "over last couple days, pain changed to a lightening bolt pain shooting & spasming all over, makes whole body seize up." States once when pain was so bad, she "screamed & had a flash of black." Seeing neurosurgeon, sx planned but new/different pain is unsettling, doesn't want to go back to work today w such bad pain.

## 2021-03-10 NOTE — ED Provider Notes (Signed)
[facility] EMERGENCY DEPARTMENT Provider Note   CSN: [id number] Arrival date & time: 03/10/21  0214     History Chief Complaint  Patient presents with   Back Pain    Angela Massey is a 45 y.o. female with a past medical history of chronic pain, fibromyalgia, peripheral neuropathy, hypertension, hyperlipidemia, PCOS, currently on pain contract with 15 mg extended release morphine twice daily and 10 mg oxycodone for breakthrough pain.  Patient presents with complaint of severe and progressively worsening sciatica symptoms.  She recently saw Dr. Trenton Gammon with neurosurgery scheduled for an MRI this coming Sunday.  The patient complains of severe back pain radiating down her left leg.  She states that she was getting intermittent shocks down the left leg however now is having increase in frequency.  She states that it is happening so frequently that she states the pain is unbearable.  She is unable to sleep because it wakes her out of her sleep every time she closes her eyes.  She states that she is having trouble making bowel movements and has to concentrate to relax to urinate but denies any incontinence of stool or overflow incontinence.  She denies saddle anesthesia or leg weakness.  She denies fevers, chills, recent procedures to her back.   Back Pain     Past Medical History:  Diagnosis Date   Anxiety    Arthritis    back and ankles   Depression    Fatty liver    Fibromyalgia    Hypertension    Hypertriglyceridemia    Ovarian abscess    PCOS (polycystic ovarian syndrome)    Pseudotumor    Sciatica     Patient Active Problem List   Diagnosis Date Noted   MDD (major depressive disorder), recurrent episode, severe ([place]) 05/11/2020   MDD (major depressive disorder), recurrent episode, moderate ([place]) 10/11/2015   GAD (generalized anxiety disorder) 10/11/2015   Panic disorder without agoraphobia 10/11/2015   Insomnia 10/11/2015   Hyperprolactinemia ([place])  05/26/2013   Depression, major 05/26/2013    Past Surgical History:  Procedure Laterality Date   Diagnostic Laparotomy     planter facitis     TUBAL LIGATION       OB History     Gravida  4   Para  3   Term  3   Preterm      AB      Living  3      SAB      IAB      Ectopic      Multiple      Live Births              Family History  Problem Relation Age of Onset   Diabetes Maternal Grandmother    Depression Mother    Anxiety disorder Mother    Alcohol abuse Father     Social History   Tobacco Use   Smoking status: Some Days    Packs/day: 0.25    Years: 10.00    Pack years: 2.50    Types: Cigarettes    Last attempt to quit: 04/17/2017    Years since quitting: 3.8   Smokeless tobacco: Never  Vaping Use   Vaping Use: Every day   Substances: Nicotine  Substance Use Topics   Alcohol use: Not Currently    Alcohol/week: 0.0 standard drinks   Drug use: No    Home Medications Prior to Admission medications   Medication Sig Start Date  End Date Taking? Authorizing Provider  acetaminophen (TYLENOL) 650 MG CR tablet Take 1,300 mg by mouth every 8 (eight) hours as needed.    [provider]  acetaZOLAMIDE (DIAMOX) 250 MG tablet Take 1 tablet (250 mg total) by mouth 2 (two) times daily. 12/20/20   Penumalli, Earlean Polka, MD  ALPRAZolam Duanne Moron) 0.5 MG tablet for sedation before MRI scan; take 1 tab 1 hour before scan; may repeat 1 tab 15 min before scan Patient not taking: Reported on 01/03/2021 12/20/20   Penumalli, Earlean Polka, MD  aspirin-acetaminophen-caffeine (EXCEDRIN MIGRAINE) [phone] MG tablet Take 1 tablet by mouth every 6 (six) hours as needed for headache.    [provider]  baclofen (LIORESAL) 10 MG tablet Take 10 mg by mouth at bedtime.    [provider]  calcium carbonate (OS-CAL) 1250 (500 Ca) MG chewable tablet Chew 2 tablets by mouth daily.    [provider]  Cholecalciferol (VITAMIN D3) 75 MCG (3000 UT)  TABS Take 9,000 Units by mouth daily.    [provider]  cyclobenzaprine (FLEXERIL) 10 MG tablet Take 1 tablet (10 mg total) by mouth 2 (two) times daily as needed for muscle spasms. 02/28/21   Domenic Moras, PA-C  diphenhydrAMINE (BENADRYL) 25 mg capsule Take 25 mg by mouth every 6 (six) hours as needed for allergies.    [provider]  DULoxetine (CYMBALTA) 60 MG capsule Take 2 capsules (120 mg total) by mouth at bedtime. Patient taking differently: Take 120 mg by mouth every morning. 05/13/20   Cristofano, Dorene Ar, MD  FLUoxetine (PROZAC) 40 MG capsule Take 40 mg by mouth daily.    [provider]  gabapentin (NEURONTIN) 800 MG tablet Take 800 mg by mouth 3 (three) times daily.    [provider]  IRON-VITAMIN C PO Take 3 tablets by mouth daily.    [provider]  lamoTRIgine (LAMICTAL) 100 MG tablet Take 300 mg by mouth daily.    [provider]  lidocaine (XYLOCAINE) 2 % solution Use as directed 15 mLs in the mouth or throat every 4 (four) hours as needed for mouth pain. 11/03/20   Mesner, Corene Cornea, MD  Magnesium Citrate 200 MG TABS Take 200 mg by mouth daily.    [provider]  Melatonin 5 MG CHEW Chew 15 mg by mouth at bedtime.    [provider]  Multiple Vitamin (STRESS FORMULA PO) Take 2 tablets by mouth daily.    [provider]  Multiple Vitamins-Minerals (HAIR/SKIN/NAILS/BIOTIN) TABS Take 2 each by mouth daily.    [provider]  Multiple Vitamins-Minerals (MULTIVITAMIN WITH MINERALS) tablet Take 2 tablets by mouth daily.    [provider]  omeprazole (PRILOSEC) 40 MG capsule Take 40 mg by mouth daily.    [provider]  predniSONE (DELTASONE) 20 MG tablet 3 tabs po day one, then 2 tabs daily x 4 days 02/28/21   Domenic Moras, PA-C  Probiotic Product (PROBIOTIC DAILY PO) Take 2 capsules by mouth daily.    [provider]  propranolol (INDERAL) 10 MG tablet Take 10 mg by mouth  daily. 10/05/19   [provider]  rOPINIRole (REQUIP) 0.5 MG tablet Take 0.5 mg by mouth at bedtime.    [provider]  vitamin B-12 (CYANOCOBALAMIN) 1000 MCG tablet Take 1,000 mcg by mouth daily.    [provider]    Allergies    Codeine, Phenergan [promethazine hcl], Seroquel [quetiapine fumarate], and Sulfa antibiotics  Review of  Systems   Review of Systems  Musculoskeletal:  Positive for back pain.  Ten systems reviewed and are negative for acute change, except as noted in the HPI.   Physical Exam Updated Vital Signs BP 123/71   Pulse 86   Temp 98.9 F (37.2 C) (Oral)   Resp 16   SpO2 100%   Physical Exam Vitals and nursing note reviewed.  Constitutional:      General: She is not in acute distress.    Appearance: She is well-developed. She is not diaphoretic.  HENT:     Head: Normocephalic and atraumatic.     Right Ear: External ear normal.     Left Ear: External ear normal.     Nose: Nose normal.     Mouth/Throat:     Mouth: Mucous membranes are moist.  Eyes:     General: No scleral icterus.    Conjunctiva/sclera: Conjunctivae normal.  Cardiovascular:     Rate and Rhythm: Normal rate and regular rhythm.     Heart sounds: Normal heart sounds. No murmur heard.   No friction rub. No gallop.  Pulmonary:     Effort: Pulmonary effort is normal. No respiratory distress.     Breath sounds: Normal breath sounds.  Abdominal:     General: Bowel sounds are normal. There is no distension.     Palpations: Abdomen is soft. There is no mass.     Tenderness: There is no abdominal tenderness. There is no guarding.  Musculoskeletal:     Cervical back: Normal range of motion.     Comments: Patient appears to be in mild to moderate pain, antalgic gait noted. Lumbosacral spine area reveals no local tenderness or mass. Painful and reduced LS ROM noted. Straight leg raise is positive at 10 degrees on left. Peripheral pulses are palpable. Sensation abnormal  but equal.  Skin:    General: Skin is warm and dry.  Neurological:     Mental Status: She is alert and oriented to person, place, and time.     Deep Tendon Reflexes:     Reflex Scores:      Patellar reflexes are 3+ on the right side and 3+ on the left side.    Comments: No myoclonus  Psychiatric:        Behavior: Behavior normal.    ED Results / Procedures / Treatments   Labs (all labs ordered are listed, but only abnormal results are displayed) Labs Reviewed - No data to display  EKG None  Radiology No results found.  Procedures Procedures   Medications Ordered in ED Medications - No data to display  ED Course  I have reviewed the triage vital signs and the nursing notes.  Pertinent labs & imaging results that were available during my care of the patient were reviewed by me and considered in my medical decision making (see chart for details).    MDM Rules/Calculators/A&P                           45 year old female with a past medical history of chronic pain, chronic back pain.  Patient has no red flag symptoms.  She does have some difficulty starting her urinary stream which is likely secondary to pelvic floor spasm in the setting of severe back pain.  She is currently already on morphine and oxycodone.  I ordered and reviewed an MRI which shows significant spinal stenosis with bulging disc at L4-L5 and impingement on the  left sciatic nerve root at L5-S1.  I discussed these findings with Dr. Trenton Gammon who also reviewed the images.  The patient does not have any signs or symptoms of cauda equina despite the concern for cauda equina nerve root compression.  She has normal rectal tone and is ambulatory without any weakness in the legs.  All these findings were discussed with Dr. Trenton Gammon who advises increase pain control and close outpatient follow-up at his office.  Patient understands these findings and is in agreement with plan.  Plan to discharge with Medrol Dosepak, Mobic, and  Flexeril.  She wishes to return to work this coming Tuesday.  Patient otherwise appropriate for discharge at this time Final Clinical Impression(s) / ED Diagnoses Final diagnoses:  None    Rx / DC Orders ED Discharge Orders     None        Margarita Mail, PA-C 03/10/21 1049    Luna Fuse, MD 03/18/21 1729

## 2021-03-10 NOTE — Discharge Instructions (Signed)
SEEK IMMEDIATE MEDICAL ATTENTION IF: New numbness, tingling, weakness, or problem with the use of your arms or legs.  Severe back pain not relieved with medications.  Change in bowel or bladder control.  Increasing pain in any areas of the body (such as chest or abdominal pain).  Shortness of breath, dizziness or fainting.  Nausea (feeling sick to your stomach), vomiting, fever, or sweats.  

## 2021-03-10 NOTE — ED Notes (Signed)
Pt to MRI

## 2021-03-10 NOTE — ED Notes (Signed)
IV, meds, VS. Pt denies needs at this time. Call bell in reach

## 2021-03-12 ENCOUNTER — Other Ambulatory Visit: Payer: Medicare Other

## 2021-03-15 ENCOUNTER — Other Ambulatory Visit: Payer: Medicare Other

## 2021-03-16 ENCOUNTER — Other Ambulatory Visit: Payer: Self-pay | Admitting: Neurosurgery

## 2021-03-20 NOTE — Progress Notes (Addendum)
Surgical Instructions    Your procedure is scheduled on 03/24/21.  Report to [facility] Main Entrance "A" at 12:30 P.M., then check in with the Admitting office.  Call this number if you have problems the morning of surgery:  [phone]   If you have any questions prior to your surgery date call [phone]: Open Monday-Friday 8am-4pm    Remember:  Do not eat or drink after midnight the night before your surgery  Call dr pool about changing to clear liquids until 1130am      Take these medicines the morning of surgery with A SIP OF WATER  DULoxetine (CYMBALTA)  FLUoxetine (PROZAC)  gabapentin (NEURONTIN)  lamoTRIgine (LAMICTAL)  morphine (MS CONTIN) omeprazole (PRILOSEC)  tiZANidine (ZANAFLEX)    IF NEEDED: acetaminophen (TYLENOL)  Oxycodone HCl lidocaine (XYLOCAINE)   As of today, STOP taking any Aspirin (unless otherwise instructed by your surgeon) Aleve, Naproxen, Ibuprofen, Motrin, Advil, Goody's, BC's, all herbal medications, fish oil, meloxicam (MOBIC) and all vitamins.          Do not wear jewelry or makeup Do not wear lotions, powders, perfumes/colognes, or deodorant. Do not shave 48 hours prior to surgery. Do not bring valuables to the hospital. DO Not wear nail polish, gel polish, artificial nails, or any other type of covering on natural nails including finger and toenails. If patients have artificial nails, gel coating, etc. that need to be removed by a nail salon please have this removed prior to surgery or surgery may need to be canceled/delayed if the surgeon/ anesthesia feels like the patient is unable to be adequately monitored.             [facility] is not responsible for any belongings or valuables.  Do NOT Smoke (Tobacco/Vaping)  24 hours prior to your procedure If you use a CPAP at night, you may bring your mask for your overnight stay.   Contacts, glasses, dentures or bridgework may not be worn into surgery, please bring cases for these  belongings   For patients admitted to the hospital, discharge time will be determined by your treatment team.   Patients discharged the day of surgery will not be allowed to drive home, and someone needs to stay with them for 24 hours.  NO VISITORS WILL BE ALLOWED IN PRE-OP WHERE PATIENTS GET READY FOR SURGERY.  ONLY 1 SUPPORT PERSON MAY BE PRESENT WHILE YOU ARE IN SURGERY.  IF YOU ARE TO BE ADMITTED, ONCE YOU ARE IN YOUR ROOM YOU WILL BE ALLOWED TWO (2) VISITORS.  Minor children may have two parents present. Special consideration for safety and communication needs will be reviewed on a case by case basis.  Special instructions:    Oral Hygiene is also important to reduce your risk of infection.  Remember - BRUSH YOUR TEETH THE MORNING OF SURGERY WITH YOUR REGULAR TOOTHPASTE   [facility]- Preparing For Surgery  Before surgery, you can play an important role. Because skin is not sterile, your skin needs to be as free of germs as possible. You can reduce the number of germs on your skin by washing with CHG (chlorahexidine gluconate) Soap before surgery.  CHG is an antiseptic cleaner which kills germs and bonds with the skin to continue killing germs even after washing.     Please do not use if you have an allergy to CHG or antibacterial soaps. If your skin becomes reddened/irritated stop using the CHG.  Do not shave (including legs and underarms) for at least 48  hours prior to first CHG shower. It is OK to shave your face.  Please follow these instructions carefully.     Shower the NIGHT BEFORE SURGERY and the MORNING OF SURGERY with CHG Soap.   If you chose to wash your hair, wash your hair first as usual with your normal shampoo. After you shampoo, rinse your hair and body thoroughly to remove the shampoo.  Then [employer] and genitals (private parts) with your normal soap and rinse thoroughly to remove soap.  After that Use CHG Soap as you would any other liquid soap. You can apply CHG  directly to the skin and wash gently with a scrungie or a clean washcloth.   Apply the CHG Soap to your body ONLY FROM THE NECK DOWN.  Do not use on open wounds or open sores. Avoid contact with your eyes, ears, mouth and genitals (private parts). Wash Face and genitals (private parts)  with your normal soap.   Wash thoroughly, paying special attention to the area where your surgery will be performed.  Thoroughly rinse your body with warm water from the neck down.  DO NOT shower/wash with your normal soap after using and rinsing off the CHG Soap.  Pat yourself dry with a CLEAN TOWEL.  Wear CLEAN PAJAMAS to bed the night before surgery  Place CLEAN SHEETS on your bed the night before your surgery  DO NOT SLEEP WITH PETS.   Day of Surgery: Take a shower with CHG soap. Wear Clean/Comfortable clothing the morning of surgery Do not apply any deodorants/lotions.   Remember to brush your teeth WITH YOUR REGULAR TOOTHPASTE.   Please read over the following fact sheets that you were given.

## 2021-03-21 ENCOUNTER — Other Ambulatory Visit: Payer: Self-pay

## 2021-03-21 ENCOUNTER — Encounter (HOSPITAL_COMMUNITY)
Admission: RE | Admit: 2021-03-21 | Discharge: 2021-03-21 | Disposition: A | Discharge status: Home / Self Care | Payer: Managed Care, Other (non HMO) | Source: Ambulatory Visit | Attending: Neurosurgery | Admitting: Neurosurgery

## 2021-03-21 ENCOUNTER — Encounter (HOSPITAL_COMMUNITY): Payer: Self-pay

## 2021-03-21 DIAGNOSIS — Z20822 Contact with and (suspected) exposure to covid-19: Secondary | ICD-10-CM | POA: Diagnosis not present

## 2021-03-21 DIAGNOSIS — Z01812 Encounter for preprocedural laboratory examination: Secondary | ICD-10-CM | POA: Insufficient documentation

## 2021-03-21 HISTORY — DX: Other specified postprocedural states: Z98.890

## 2021-03-21 HISTORY — DX: Anemia, unspecified: D64.9

## 2021-03-21 HISTORY — DX: Gastro-esophageal reflux disease without esophagitis: K21.9

## 2021-03-21 HISTORY — DX: Other specified postprocedural states: R11.2

## 2021-03-21 HISTORY — DX: Headache, unspecified: R51.9

## 2021-03-21 LAB — CBC WITH DIFFERENTIAL/PLATELET
Abs Immature Granulocytes: 0.03 10*3/uL (ref 0.00–0.07)
Basophils Absolute: 0.1 10*3/uL (ref 0.0–0.1)
Basophils Relative: 1 %
Eosinophils Absolute: 0.2 10*3/uL (ref 0.0–0.5)
Eosinophils Relative: 2 %
HCT: 41.6 % (ref 36.0–46.0)
Hemoglobin: 13.1 g/dL (ref 12.0–15.0)
Immature Granulocytes: 0 %
Lymphocytes Relative: 30 %
Lymphs Abs: 3.2 10*3/uL (ref 0.7–4.0)
MCH: 28.4 pg (ref 26.0–34.0)
MCHC: 31.5 g/dL (ref 30.0–36.0)
MCV: 90.2 fL (ref 80.0–100.0)
Monocytes Absolute: 0.7 10*3/uL (ref 0.1–1.0)
Monocytes Relative: 6 %
Neutro Abs: 6.8 10*3/uL (ref 1.7–7.7)
Neutrophils Relative %: 61 %
Platelets: 336 10*3/uL (ref 150–400)
RBC: 4.61 MIL/uL (ref 3.87–5.11)
RDW: 13.2 % (ref 11.5–15.5)
WBC: 11 10*3/uL — ABNORMAL HIGH (ref 4.0–10.5)
nRBC: 0 % (ref 0.0–0.2)

## 2021-03-21 LAB — TYPE AND SCREEN
ABO/RH(D): A POS
Antibody Screen: NEGATIVE

## 2021-03-21 LAB — SARS CORONAVIRUS 2 (TAT 6-24 HRS): SARS Coronavirus 2: NEGATIVE

## 2021-03-21 LAB — SURGICAL PCR SCREEN
MRSA, PCR: NEGATIVE
Staphylococcus aureus: POSITIVE — AB

## 2021-03-21 NOTE — Progress Notes (Signed)
PCP - Donita Brooks Cardiologist - denies Pain management: Dr. Greta Doom with [facility]  PPM/ICD - denies   Chest x-ray - n/a EKG - 05/16/20 Stress Test - denies ECHO - denies Cardiac Cath - denies  Sleep Study - denies   No diabetes  Patient instructed to hold all Aspirin, NSAID's, herbal medications, fish oil and vitamins 7 days prior to surgery.   ERAS Protcol -no   COVID TEST- performed in PAT 03/21/21   Anesthesia review: no  Patient denies shortness of breath, fever, cough and chest pain at PAT appointment   All instructions explained to the patient, with a verbal understanding of the material. Patient agrees to go over the instructions while at home for a better understanding. Patient also instructed to self quarantine after being tested for COVID-19. The opportunity to ask questions was provided.

## 2021-03-24 ENCOUNTER — Inpatient Hospital Stay (HOSPITAL_COMMUNITY): Payer: Managed Care, Other (non HMO) | Admitting: Certified Registered Nurse Anesthetist

## 2021-03-24 ENCOUNTER — Observation Stay (HOSPITAL_COMMUNITY)
Admission: RE | Admit: 2021-03-24 | Discharge: 2021-03-25 | Disposition: A | Discharge status: Home / Self Care | Payer: Managed Care, Other (non HMO) | Attending: Neurosurgery | Admitting: Neurosurgery

## 2021-03-24 ENCOUNTER — Inpatient Hospital Stay (HOSPITAL_COMMUNITY): Payer: Managed Care, Other (non HMO)

## 2021-03-24 ENCOUNTER — Other Ambulatory Visit: Payer: Self-pay

## 2021-03-24 ENCOUNTER — Ambulatory Visit (HOSPITAL_COMMUNITY)
Admission: RE | Disposition: A | Discharge status: Home / Self Care | Payer: Self-pay | Source: Home / Self Care | Attending: Neurosurgery

## 2021-03-24 ENCOUNTER — Encounter (HOSPITAL_COMMUNITY): Payer: Self-pay | Admitting: Neurosurgery

## 2021-03-24 DIAGNOSIS — M48061 Spinal stenosis, lumbar region without neurogenic claudication: Secondary | ICD-10-CM | POA: Insufficient documentation

## 2021-03-24 DIAGNOSIS — F17210 Nicotine dependence, cigarettes, uncomplicated: Secondary | ICD-10-CM | POA: Diagnosis not present

## 2021-03-24 DIAGNOSIS — M5117 Intervertebral disc disorders with radiculopathy, lumbosacral region: Secondary | ICD-10-CM | POA: Diagnosis not present

## 2021-03-24 DIAGNOSIS — Z79899 Other long term (current) drug therapy: Secondary | ICD-10-CM | POA: Diagnosis not present

## 2021-03-24 DIAGNOSIS — Z7982 Long term (current) use of aspirin: Secondary | ICD-10-CM | POA: Insufficient documentation

## 2021-03-24 DIAGNOSIS — M4310 Spondylolisthesis, site unspecified: Secondary | ICD-10-CM | POA: Diagnosis present

## 2021-03-24 DIAGNOSIS — Z419 Encounter for procedure for purposes other than remedying health state, unspecified: Secondary | ICD-10-CM

## 2021-03-24 DIAGNOSIS — M4316 Spondylolisthesis, lumbar region: Secondary | ICD-10-CM | POA: Diagnosis not present

## 2021-03-24 LAB — POCT PREGNANCY, URINE: Preg Test, Ur: NEGATIVE

## 2021-03-24 LAB — ABO/RH: ABO/RH(D): A POS

## 2021-03-24 SURGERY — POSTERIOR LUMBAR FUSION 2 LEVEL
Anesthesia: General | Site: Back

## 2021-03-24 MED ORDER — MIDAZOLAM HCL 2 MG/2ML IJ SOLN
INTRAMUSCULAR | Status: AC
  Filled 2021-03-24: qty 2

## 2021-03-24 MED ORDER — LAMOTRIGINE 100 MG PO TABS
200.0000 mg | ORAL_TABLET | Freq: Two times a day (BID) | ORAL | Status: DC
  Administered 2021-03-24 – 2021-03-25 (×2): 200 mg via ORAL
  Filled 2021-03-24 (×3): qty 2

## 2021-03-24 MED ORDER — FLUOXETINE HCL 20 MG PO CAPS
60.0000 mg | ORAL_CAPSULE | Freq: Every day | ORAL | Status: DC
  Administered 2021-03-25: 60 mg via ORAL
  Filled 2021-03-24: qty 3

## 2021-03-24 MED ORDER — POLYETHYLENE GLYCOL 3350 17 G PO PACK: 17.0000 g | PACK | Freq: Every day | ORAL | Status: DC | PRN

## 2021-03-24 MED ORDER — HYDROMORPHONE HCL 1 MG/ML IJ SOLN
0.2500 mg | INTRAMUSCULAR | Status: DC | PRN
  Administered 2021-03-24 (×4): 0.5 mg via INTRAVENOUS

## 2021-03-24 MED ORDER — LIDOCAINE 2% (20 MG/ML) 5 ML SYRINGE
INTRAMUSCULAR | Status: DC | PRN
  Administered 2021-03-24: 80 mg via INTRAVENOUS

## 2021-03-24 MED ORDER — SODIUM CHLORIDE 0.9% FLUSH: 3.0000 mL | INTRAVENOUS | Status: DC | PRN

## 2021-03-24 MED ORDER — TIZANIDINE HCL 4 MG PO TABS
2.0000 mg | ORAL_TABLET | Freq: Four times a day (QID) | ORAL | Status: DC | PRN
  Administered 2021-03-25: 4 mg via ORAL
  Filled 2021-03-24: qty 1

## 2021-03-24 MED ORDER — HYDROMORPHONE HCL 1 MG/ML IJ SOLN
INTRAMUSCULAR | Status: AC
  Filled 2021-03-24: qty 1

## 2021-03-24 MED ORDER — HYDROCODONE-ACETAMINOPHEN 10-325 MG PO TABS
1.0000 | ORAL_TABLET | ORAL | Status: DC | PRN
  Administered 2021-03-24 – 2021-03-25 (×4): 1 via ORAL
  Filled 2021-03-24 (×4): qty 1

## 2021-03-24 MED ORDER — BISACODYL 10 MG RE SUPP: 10.0000 mg | Freq: Every day | RECTAL | Status: DC | PRN

## 2021-03-24 MED ORDER — ONDANSETRON HCL 4 MG/2ML IJ SOLN
INTRAMUSCULAR | Status: DC | PRN
Start: 2021-03-24 — End: 2021-03-24
  Administered 2021-03-24: 4 mg via INTRAVENOUS

## 2021-03-24 MED ORDER — ONDANSETRON HCL 4 MG PO TABS: 4.0000 mg | ORAL_TABLET | Freq: Four times a day (QID) | ORAL | Status: DC | PRN

## 2021-03-24 MED ORDER — LIDOCAINE HCL (PF) 2 % IJ SOLN
INTRAMUSCULAR | Status: AC
  Filled 2021-03-24: qty 5

## 2021-03-24 MED ORDER — PROPOFOL 10 MG/ML IV BOLUS
INTRAVENOUS | Status: AC
  Filled 2021-03-24: qty 20

## 2021-03-24 MED ORDER — NEOSTIGMINE METHYLSULFATE 3 MG/3ML IV SOSY
PREFILLED_SYRINGE | INTRAVENOUS | Status: AC
  Filled 2021-03-24: qty 3

## 2021-03-24 MED ORDER — FENTANYL CITRATE (PF) 250 MCG/5ML IJ SOLN
INTRAMUSCULAR | Status: AC
  Filled 2021-03-24: qty 5

## 2021-03-24 MED ORDER — FENTANYL CITRATE (PF) 250 MCG/5ML IJ SOLN
INTRAMUSCULAR | Status: DC | PRN
  Administered 2021-03-24 (×7): 50 ug via INTRAVENOUS
  Administered 2021-03-24: 100 ug via INTRAVENOUS
  Administered 2021-03-24: 50 ug via INTRAVENOUS

## 2021-03-24 MED ORDER — DULOXETINE HCL 30 MG PO CPEP
120.0000 mg | ORAL_CAPSULE | ORAL | Status: DC
  Administered 2021-03-25: 120 mg via ORAL
  Filled 2021-03-24: qty 4

## 2021-03-24 MED ORDER — LACTATED RINGERS IV SOLN: INTRAVENOUS | Status: DC

## 2021-03-24 MED ORDER — VANCOMYCIN HCL 1000 MG IV SOLR
INTRAVENOUS | Status: DC | PRN
  Administered 2021-03-24: 1000 mg

## 2021-03-24 MED ORDER — GABAPENTIN 400 MG PO CAPS
800.0000 mg | ORAL_CAPSULE | Freq: Three times a day (TID) | ORAL | Status: DC
  Administered 2021-03-24 – 2021-03-25 (×2): 800 mg via ORAL
  Filled 2021-03-24 (×2): qty 2

## 2021-03-24 MED ORDER — PHENYLEPHRINE 40 MCG/ML (10ML) SYRINGE FOR IV PUSH (FOR BLOOD PRESSURE SUPPORT)
PREFILLED_SYRINGE | INTRAVENOUS | Status: AC
  Filled 2021-03-24: qty 10

## 2021-03-24 MED ORDER — VANCOMYCIN HCL 1000 MG IV SOLR
INTRAVENOUS | Status: AC
  Filled 2021-03-24: qty 20

## 2021-03-24 MED ORDER — ROCURONIUM BROMIDE 10 MG/ML (PF) SYRINGE
PREFILLED_SYRINGE | INTRAVENOUS | Status: DC | PRN
Start: 2021-03-24 — End: 2021-03-24
  Administered 2021-03-24: 20 mg via INTRAVENOUS
  Administered 2021-03-24 (×2): 50 mg via INTRAVENOUS
  Administered 2021-03-24: 20 mg via INTRAVENOUS
  Administered 2021-03-24: 30 mg via INTRAVENOUS

## 2021-03-24 MED ORDER — PROPOFOL 500 MG/50ML IV EMUL: INTRAVENOUS | Status: DC | PRN

## 2021-03-24 MED ORDER — THROMBIN 20000 UNITS EX SOLR
CUTANEOUS | Status: AC
  Filled 2021-03-24: qty 20000

## 2021-03-24 MED ORDER — PANTOPRAZOLE SODIUM 40 MG PO TBEC
40.0000 mg | DELAYED_RELEASE_TABLET | Freq: Every day | ORAL | Status: DC
  Administered 2021-03-25: 40 mg via ORAL
  Filled 2021-03-24 (×2): qty 1

## 2021-03-24 MED ORDER — VANCOMYCIN HCL IN DEXTROSE 1-5 GM/200ML-% IV SOLN
1000.0000 mg | INTRAVENOUS | Status: AC
  Administered 2021-03-24: 1000 mg via INTRAVENOUS
  Filled 2021-03-24: qty 200

## 2021-03-24 MED ORDER — ONDANSETRON HCL 4 MG/2ML IJ SOLN
INTRAMUSCULAR | Status: AC
  Filled 2021-03-24: qty 2

## 2021-03-24 MED ORDER — GLYCOPYRROLATE PF 0.2 MG/ML IJ SOSY
PREFILLED_SYRINGE | INTRAMUSCULAR | Status: AC
  Filled 2021-03-24: qty 2

## 2021-03-24 MED ORDER — CHLORHEXIDINE GLUCONATE CLOTH 2 % EX PADS: 6.0000 | MEDICATED_PAD | Freq: Once | CUTANEOUS | Status: DC

## 2021-03-24 MED ORDER — FERROUS SULFATE 325 (65 FE) MG PO TABS: 650.0000 mg | ORAL_TABLET | ORAL | Status: DC

## 2021-03-24 MED ORDER — SUGAMMADEX SODIUM 200 MG/2ML IV SOLN
INTRAVENOUS | Status: DC | PRN
Start: 2021-03-24 — End: 2021-03-24
  Administered 2021-03-24: 200 mg via INTRAVENOUS

## 2021-03-24 MED ORDER — ACETAMINOPHEN 500 MG PO TABS
1000.0000 mg | ORAL_TABLET | Freq: Once | ORAL | Status: AC
  Administered 2021-03-24: 1000 mg via ORAL
  Filled 2021-03-24: qty 2

## 2021-03-24 MED ORDER — PHENOL 1.4 % MT LIQD: 1.0000 | OROMUCOSAL | Status: DC | PRN

## 2021-03-24 MED ORDER — EPHEDRINE 5 MG/ML INJ
INTRAVENOUS | Status: AC
  Filled 2021-03-24: qty 10

## 2021-03-24 MED ORDER — MIDAZOLAM HCL 2 MG/2ML IJ SOLN
INTRAMUSCULAR | Status: DC | PRN
Start: 2021-03-24 — End: 2021-03-24
  Administered 2021-03-24: 2 mg via INTRAVENOUS

## 2021-03-24 MED ORDER — PROPOFOL 1000 MG/100ML IV EMUL
INTRAVENOUS | Status: AC
  Filled 2021-03-24: qty 100

## 2021-03-24 MED ORDER — DEXAMETHASONE SODIUM PHOSPHATE 10 MG/ML IJ SOLN
10.0000 mg | Freq: Once | INTRAMUSCULAR | Status: AC
  Administered 2021-03-24: 10 mg via INTRAVENOUS
  Filled 2021-03-24: qty 1

## 2021-03-24 MED ORDER — DROPERIDOL 2.5 MG/ML IJ SOLN: 0.6250 mg | Freq: Once | INTRAMUSCULAR | Status: DC | PRN

## 2021-03-24 MED ORDER — DIAZEPAM 5 MG PO TABS
5.0000 mg | ORAL_TABLET | Freq: Four times a day (QID) | ORAL | Status: DC | PRN
  Administered 2021-03-24 – 2021-03-25 (×2): 10 mg via ORAL
  Filled 2021-03-24 (×2): qty 2

## 2021-03-24 MED ORDER — THROMBIN 20000 UNITS EX SOLR
CUTANEOUS | Status: DC | PRN
  Administered 2021-03-24 (×2): 20 mL via TOPICAL

## 2021-03-24 MED ORDER — OXYCODONE HCL 5 MG PO TABS
10.0000 mg | ORAL_TABLET | ORAL | Status: DC | PRN
Start: 2021-03-24 — End: 2021-03-25
  Administered 2021-03-24 – 2021-03-25 (×2): 10 mg via ORAL
  Filled 2021-03-24 (×2): qty 2

## 2021-03-24 MED ORDER — PHENYLEPHRINE 40 MCG/ML (10ML) SYRINGE FOR IV PUSH (FOR BLOOD PRESSURE SUPPORT)
PREFILLED_SYRINGE | INTRAVENOUS | Status: DC | PRN
  Administered 2021-03-24 (×2): 80 ug via INTRAVENOUS
  Administered 2021-03-24: 40 ug via INTRAVENOUS

## 2021-03-24 MED ORDER — ONDANSETRON HCL 4 MG/2ML IJ SOLN: 4.0000 mg | Freq: Four times a day (QID) | INTRAMUSCULAR | Status: DC | PRN

## 2021-03-24 MED ORDER — SODIUM CHLORIDE 0.9 % IV SOLN: 250.0000 mL | INTRAVENOUS | Status: DC

## 2021-03-24 MED ORDER — SODIUM CHLORIDE (PF) 0.9 % IJ SOLN
INTRAMUSCULAR | Status: AC
  Filled 2021-03-24: qty 10

## 2021-03-24 MED ORDER — HYDROMORPHONE HCL 1 MG/ML IJ SOLN: 1.0000 mg | INTRAMUSCULAR | Status: DC | PRN

## 2021-03-24 MED ORDER — MENTHOL 3 MG MT LOZG: 1.0000 | LOZENGE | OROMUCOSAL | Status: DC | PRN

## 2021-03-24 MED ORDER — FLUOXETINE HCL 40 MG PO CAPS: 40.0000 mg | ORAL_CAPSULE | Freq: Every day | ORAL | Status: DC

## 2021-03-24 MED ORDER — SUCCINYLCHOLINE CHLORIDE 200 MG/10ML IV SOSY
PREFILLED_SYRINGE | INTRAVENOUS | Status: AC
  Filled 2021-03-24: qty 10

## 2021-03-24 MED ORDER — CEFAZOLIN SODIUM-DEXTROSE 1-4 GM/50ML-% IV SOLN
1.0000 g | Freq: Three times a day (TID) | INTRAVENOUS | Status: AC
Start: 2021-03-24 — End: 2021-03-25
  Administered 2021-03-24 – 2021-03-25 (×2): 1 g via INTRAVENOUS
  Filled 2021-03-24 (×2): qty 50

## 2021-03-24 MED ORDER — BUPIVACAINE HCL (PF) 0.25 % IJ SOLN
INTRAMUSCULAR | Status: DC | PRN
  Administered 2021-03-24: 30 mL

## 2021-03-24 MED ORDER — SODIUM CHLORIDE 0.9 % IV SOLN: INTRAVENOUS | Status: DC | PRN

## 2021-03-24 MED ORDER — HYDROMORPHONE HCL 1 MG/ML IJ SOLN
INTRAMUSCULAR | Status: DC | PRN
  Administered 2021-03-24 (×2): .25 mg via INTRAVENOUS

## 2021-03-24 MED ORDER — MEPERIDINE HCL 25 MG/ML IJ SOLN: 6.2500 mg | INTRAMUSCULAR | Status: DC | PRN

## 2021-03-24 MED ORDER — ACETAMINOPHEN 650 MG RE SUPP: 650.0000 mg | RECTAL | Status: DC | PRN

## 2021-03-24 MED ORDER — ROCURONIUM BROMIDE 10 MG/ML (PF) SYRINGE
PREFILLED_SYRINGE | INTRAVENOUS | Status: AC
  Filled 2021-03-24: qty 20

## 2021-03-24 MED ORDER — MORPHINE SULFATE ER 15 MG PO TBCR
15.0000 mg | EXTENDED_RELEASE_TABLET | Freq: Two times a day (BID) | ORAL | Status: DC
  Administered 2021-03-24 – 2021-03-25 (×2): 15 mg via ORAL
  Filled 2021-03-24 (×2): qty 1

## 2021-03-24 MED ORDER — 0.9 % SODIUM CHLORIDE (POUR BTL) OPTIME
TOPICAL | Status: DC | PRN
  Administered 2021-03-24: 1000 mL

## 2021-03-24 MED ORDER — FLEET ENEMA 7-19 GM/118ML RE ENEM
1.0000 | ENEMA | Freq: Once | RECTAL | Status: DC | PRN
Start: 2021-03-24 — End: 2021-03-25

## 2021-03-24 MED ORDER — HYDROMORPHONE HCL 1 MG/ML IJ SOLN
INTRAMUSCULAR | Status: AC
  Filled 2021-03-24: qty 0.5

## 2021-03-24 MED ORDER — ACETAMINOPHEN 325 MG PO TABS: 650.0000 mg | ORAL_TABLET | ORAL | Status: DC | PRN

## 2021-03-24 MED ORDER — DEXAMETHASONE SODIUM PHOSPHATE 10 MG/ML IJ SOLN
INTRAMUSCULAR | Status: AC
  Filled 2021-03-24: qty 1

## 2021-03-24 MED ORDER — BUPIVACAINE HCL (PF) 0.25 % IJ SOLN
INTRAMUSCULAR | Status: AC
  Filled 2021-03-24: qty 30

## 2021-03-24 MED ORDER — CHLORHEXIDINE GLUCONATE 0.12 % MT SOLN
15.0000 mL | Freq: Once | OROMUCOSAL | Status: AC
  Administered 2021-03-24: 15 mL via OROMUCOSAL
  Filled 2021-03-24: qty 15

## 2021-03-24 MED ORDER — ORAL CARE MOUTH RINSE: 15.0000 mL | Freq: Once | OROMUCOSAL | Status: AC

## 2021-03-24 MED ORDER — MELATONIN 5 MG PO TABS: 10.0000 mg | ORAL_TABLET | Freq: Every evening | ORAL | Status: DC | PRN

## 2021-03-24 MED ORDER — SODIUM CHLORIDE 0.9% FLUSH
3.0000 mL | Freq: Two times a day (BID) | INTRAVENOUS | Status: DC
  Administered 2021-03-25: 3 mL via INTRAVENOUS

## 2021-03-24 MED ORDER — PROPOFOL 10 MG/ML IV BOLUS
INTRAVENOUS | Status: DC | PRN
  Administered 2021-03-24: 150 mg via INTRAVENOUS
  Administered 2021-03-24: 40 mg via INTRAVENOUS
  Administered 2021-03-24: 200 mg via INTRAVENOUS

## 2021-03-24 SURGICAL SUPPLY — 66 items
ADH SKN CLS APL DERMABOND .7 (GAUZE/BANDAGES/DRESSINGS) ×1
APL SKNCLS STERI-STRIP NONHPOA (GAUZE/BANDAGES/DRESSINGS) ×1
BAG COUNTER SPONGE SURGICOUNT (BAG) ×2 IMPLANT
BAG DECANTER FOR FLEXI CONT (MISCELLANEOUS) ×2 IMPLANT
BAG SPNG CNTER NS LX DISP (BAG) ×1
BENZOIN TINCTURE PRP APPL 2/3 (GAUZE/BANDAGES/DRESSINGS) ×2 IMPLANT
BLADE CLIPPER SURG (BLADE) IMPLANT
BONE GRAFTON DBF INJECT 6CC (Bone Implant) ×4 IMPLANT
BUR CUTTER 7.0 ROUND (BURR) IMPLANT
BUR MATCHSTICK NEURO 3.0 LAGG (BURR) ×2 IMPLANT
CAGE EXP CATALYFT 9 (Plate) ×8 IMPLANT
CANISTER SUCT 3000ML PPV (MISCELLANEOUS) ×2 IMPLANT
CAP LCK SPNE (Orthopedic Implant) ×6 IMPLANT
CAP LOCK SPINE RADIUS (Orthopedic Implant) ×6 IMPLANT
CAP LOCKING (Orthopedic Implant) ×12 IMPLANT
CARTRIDGE OIL MAESTRO DRILL (MISCELLANEOUS) ×1 IMPLANT
CLSR STERI-STRIP ANTIMIC 1/2X4 (GAUZE/BANDAGES/DRESSINGS) ×2 IMPLANT
CNTNR URN SCR LID CUP LEK RST (MISCELLANEOUS) ×1 IMPLANT
CONT SPEC 4OZ STRL OR WHT (MISCELLANEOUS) ×2
COVER BACK TABLE 60X90IN (DRAPES) ×2 IMPLANT
DECANTER SPIKE VIAL GLASS SM (MISCELLANEOUS) ×2 IMPLANT
DERMABOND ADVANCED (GAUZE/BANDAGES/DRESSINGS) ×1
DERMABOND ADVANCED .7 DNX12 (GAUZE/BANDAGES/DRESSINGS) ×1 IMPLANT
DIFFUSER DRILL AIR PNEUMATIC (MISCELLANEOUS) ×2 IMPLANT
DRAPE C-ARM 42X72 X-RAY (DRAPES) ×4 IMPLANT
DRAPE HALF SHEET 40X57 (DRAPES) IMPLANT
DRAPE LAPAROTOMY 100X72X124 (DRAPES) ×2 IMPLANT
DRAPE SURG 17X23 STRL (DRAPES) ×8 IMPLANT
DRSG OPSITE POSTOP 4X6 (GAUZE/BANDAGES/DRESSINGS) ×2 IMPLANT
DRSG OPSITE POSTOP 4X8 (GAUZE/BANDAGES/DRESSINGS) ×2 IMPLANT
DURAPREP 26ML APPLICATOR (WOUND CARE) ×2 IMPLANT
ELECT REM PT RETURN 9FT ADLT (ELECTROSURGICAL) ×2
ELECTRODE REM PT RTRN 9FT ADLT (ELECTROSURGICAL) ×1 IMPLANT
EVACUATOR 1/8 PVC DRAIN (DRAIN) IMPLANT
GAUZE 4X4 16PLY ~~LOC~~+RFID DBL (SPONGE) ×2 IMPLANT
GAUZE SPONGE 4X4 12PLY STRL (GAUZE/BANDAGES/DRESSINGS) IMPLANT
GLOVE SURG ENC MOIS LTX SZ6.5 (GLOVE) ×6 IMPLANT
GLOVE SURG LTX SZ9 (GLOVE) ×4 IMPLANT
GLOVE SURG UNDER POLY LF SZ6.5 (GLOVE) ×6 IMPLANT
GOWN STRL REUS W/ TWL LRG LVL3 (GOWN DISPOSABLE) ×2 IMPLANT
GOWN STRL REUS W/ TWL XL LVL3 (GOWN DISPOSABLE) ×3 IMPLANT
GOWN STRL REUS W/TWL 2XL LVL3 (GOWN DISPOSABLE) IMPLANT
GOWN STRL REUS W/TWL LRG LVL3 (GOWN DISPOSABLE) ×4
GOWN STRL REUS W/TWL XL LVL3 (GOWN DISPOSABLE) ×6
KIT BASIN OR (CUSTOM PROCEDURE TRAY) ×2 IMPLANT
KIT TURNOVER KIT B (KITS) ×2 IMPLANT
MILL MEDIUM DISP (BLADE) ×2 IMPLANT
NEEDLE HYPO 22GX1.5 SAFETY (NEEDLE) ×2 IMPLANT
NS IRRIG 1000ML POUR BTL (IV SOLUTION) ×2 IMPLANT
OIL CARTRIDGE MAESTRO DRILL (MISCELLANEOUS) ×2
PACK LAMINECTOMY NEURO (CUSTOM PROCEDURE TRAY) ×2 IMPLANT
PATTIES SURGICAL 1X1 (DISPOSABLE) ×2 IMPLANT
ROD 5.5X60MM PURPLE (Rod) ×4 IMPLANT
SCREW 5.75X40M (Screw) ×2 IMPLANT
SCREW 5.75X45MM (Screw) ×10 IMPLANT
SPONGE SURGIFOAM ABS GEL 100 (HEMOSTASIS) ×4 IMPLANT
SPONGE T-LAP 4X18 ~~LOC~~+RFID (SPONGE) ×6 IMPLANT
STRIP CLOSURE SKIN 1/2X4 (GAUZE/BANDAGES/DRESSINGS) ×4 IMPLANT
SUT VIC AB 0 CT1 18XCR BRD8 (SUTURE) ×2 IMPLANT
SUT VIC AB 0 CT1 8-18 (SUTURE) ×4
SUT VIC AB 2-0 CT1 18 (SUTURE) ×2 IMPLANT
SUT VIC AB 3-0 SH 8-18 (SUTURE) ×4 IMPLANT
TOWEL GREEN STERILE (TOWEL DISPOSABLE) ×2 IMPLANT
TOWEL GREEN STERILE FF (TOWEL DISPOSABLE) ×2 IMPLANT
TRAY FOLEY MTR SLVR 16FR STAT (SET/KITS/TRAYS/PACK) ×2 IMPLANT
WATER STERILE IRR 1000ML POUR (IV SOLUTION) ×2 IMPLANT

## 2021-03-24 NOTE — Transfer of Care (Signed)
Immediate Anesthesia Transfer of Care Note  Patient: Angela Massey  Procedure(s) Performed: POSTERIOR LUMBAR INTERBODY FUSION - LUMBAR FOUR-LUMBAR FIVE,  LUMBAR FIVE-SACRAL ONE (Back)  Patient Location: PACU  Anesthesia Type:General  Level of Consciousness: awake, alert  and patient cooperative  Airway & Oxygen Therapy: Patient Spontanous Breathing and Patient connected to face mask oxygen  Post-op Assessment: Report given to RN, Post -op Vital signs reviewed and stable and Patient moving all extremities X 4  Post vital signs: Reviewed and stable  Last Vitals:  Vitals Value Taken Time  BP 131/62 03/24/21 1543  Temp    Pulse 79 03/24/21 1546  Resp 14 03/24/21 1546  SpO2 100 % 03/24/21 1546  Vitals shown include unvalidated device data.  Last Pain:  Vitals:   03/24/21 1104  TempSrc:   PainSc: 7       Patients Stated Pain Goal: 3 (70/35/00 9381)  Complications: No notable events documented.

## 2021-03-24 NOTE — Op Note (Signed)
Date of procedure: 03/24/2021  Date of dictation: Same  Service: Neurosurgery  Preoperative diagnosis: Grade 2 unstable degenerative spondylolisthesis at L4-5 with critical spinal stenosis, grade 1 L5-S1 degenerative spondylolisthesis with a left L5-S1 herniated nucleus pulposus with radiculopathy  Postoperative diagnosis: Same  Procedure Name: Bilateral L4-5 and L5-S1 decompressive laminotomies and foraminotomies, more than would be required for simple interbody fusion alone.  L4-5, L5-S1 posterior lumbar MRI fusion utilizing interbody cages, local harvested autograft, and morselized allograft.  L4-5 S1 posterior arthrodesis utilizing segmental pedicle screw fixation local autograft  Surgeon:Randell Teare A.Avraj Lindroth, M.D.  Asst. Surgeon: Daun Peacock, NP  Anesthesia: General  Indication: 45 year old female with severe chronic back pain and worsening lower extremity numbness paresthesias and weakness.  Work-up demonstrates evidence of unstable grade 2 degenerative spondylolisthesis with critical spinal stenosis at L4-5.  At L5-S1 patient has an acute paracentral disc herniation of the left side causing marked compression of thecal sac and left S1 nerve root.  There is an accompanying grade 1 degenerative spondylolisthesis at this level as well.  Remainder lumbar spine appears reasonably healthy.  Patient presents now for two-level lumbar decompression and fusion in hopes improving her symptoms.  Operative note: After induction of anesthesia, patient position prone on the Wilson frame appropriate padded region prepped and draped sterilely.  Incision made overlying L4-5 and S1.  Dissection performed bilaterally.  Retractor placed.  Fluoroscopy used.  Levels confirmed.  Decompressive laminotomies and facetectomies then performed using Leksell rongeurs, Kerrison rongeurs and high-speed drill to remove the inferior two thirds of the lamina above the entire inferior facet and pars interarticularis and the  majority of the superior facet at both L4-5 and L5-S1.  The superior aspect of the L5 and S1 lamina were also resected.  Ligament flavum elevated and resected.  On the left side of L5-S1 a large free fragment disc herniation was encountered and removed.  Epidural venous plexus coagulated and cut.  Bilateral discectomies then performed.  Disc base was then prepared for interbody fusion.  Each disc base was cleaned of all soft tissue.  Distractor was placed patient's right side at L4-5 and L5-S1.  The space was further prepared for interbody fusion.  A 9 mm Medtronic expandable cage was then impacted in the place at L4-5 and L5-S1 on the left side.  Each cage was expanded.  Distractor was moved patient's right side and attention then placed to the right side.  This patient was then cleaned of soft tissue on the right side.  Morselized autograft was packed in the interspace.  Second cage was then impacted in the place at both levels and expanded.  Each cage was then packed with graft on demineralized bone fibers.  Pedicles of L4-L5 and S1 were identified using surface landmarks and intraoperative fluoroscopy and superficial bone around the pedicle was then removed using high-speed drill.  Pedicle was then probed using a pedicle all each pedicle tract was then probed and found to be solidly within the bone.  Each pedicle tract was then tapped with a screw tap.  Each screw temple was probed and found to be solidly within the bone.  5.75 mm radius brand screws [employer] placed bilaterally at L4-L5 and S1.  Final images reveal good position of the cages and hardware proper level with normal alignment of spine.  Short segment titanium rod then placed over the screw heads at L4-5 and S1.  Locking caps placed over the screws and locking caps and engaged with a construct under compression.  Transverse processes and sacral ala were decorticated.  Morselized autograft was packed posterior laterally for later fusion.   Gelfoam was placed over the laminotomy defects.  Vancomycin powder was placed in the deep wound space.  Wounds were closed in layers with Vicryl sutures.  Steri-Strips and sterile dressing were applied.  No apparent complications.  Patient tolerated the procedure well and she returns to the recovery room postop.

## 2021-03-24 NOTE — Anesthesia Preprocedure Evaluation (Signed)
Anesthesia Evaluation  Patient identified by MRN, date of birth, ID band Patient awake    Reviewed: Allergy & Precautions, NPO status , Patient's Chart, lab work & pertinent test results  History of Anesthesia Complications (+) PONV and history of anesthetic complications  Airway Mallampati: III  TM Distance: >3 FB Neck ROM: Full    Dental  (+) Dental Advisory Given, Teeth Intact   Pulmonary neg pulmonary ROS, Current Smoker,    Pulmonary exam normal breath sounds clear to auscultation       Cardiovascular negative cardio ROS Normal cardiovascular exam Rhythm:Regular Rate:Normal     Neuro/Psych  Headaches, PSYCHIATRIC DISORDERS Anxiety Depression  Neuromuscular disease    GI/Hepatic Neg liver ROS, GERD  ,  Endo/Other  negative endocrine ROS  Renal/GU negative Renal ROS     Musculoskeletal  (+) Arthritis , Fibromyalgia -  Abdominal   Peds  Hematology  (+) Blood dyscrasia, anemia ,   Anesthesia Other Findings   Reproductive/Obstetrics                            Anesthesia Physical Anesthesia Plan  ASA: 3  Anesthesia Plan: General   Post-op Pain Management:    Induction: Intravenous  PONV Risk Score and Plan: 4 or greater and Ondansetron, Dexamethasone, Treatment may vary due to age or medical condition, Midazolam and Propofol infusion  Airway Management Planned: Oral ETT  Additional Equipment: None  Intra-op Plan:   Post-operative Plan: Extubation in OR  Informed Consent: I have reviewed the patients History and Physical, chart, labs and discussed the procedure including the risks, benefits and alternatives for the proposed anesthesia with the patient or authorized representative who has indicated his/her understanding and acceptance.     Dental advisory given  Plan Discussed with: CRNA  Anesthesia Plan Comments:        Anesthesia Quick Evaluation

## 2021-03-24 NOTE — Anesthesia Procedure Notes (Signed)
Procedure Name: Intubation Date/Time: 03/24/2021 11:38 AM Performed by: Betha Loa, CRNA Pre-anesthesia Checklist: Patient identified, Emergency Drugs available, Suction available and Patient being monitored Patient Re-evaluated:Patient Re-evaluated prior to induction Oxygen Delivery Method: Circle System Utilized Preoxygenation: Pre-oxygenation with 100% oxygen Induction Type: IV induction Ventilation: Mask ventilation without difficulty Laryngoscope Size: Mac and 3 Grade View: Grade I Tube type: Oral Tube size: 7.0 mm Number of attempts: 1 Airway Equipment and Method: Stylet and Oral airway Placement Confirmation: ETT inserted through vocal cords under direct vision, positive ETCO2 and breath sounds checked- equal and bilateral Secured at: 21 cm Tube secured with: Tape Dental Injury: Teeth and Oropharynx as per pre-operative assessment

## 2021-03-24 NOTE — Anesthesia Postprocedure Evaluation (Signed)
Anesthesia Post Note  Patient: KADA FRIESEN  Procedure(s) Performed: POSTERIOR LUMBAR INTERBODY FUSION - LUMBAR FOUR-LUMBAR FIVE,  LUMBAR FIVE-SACRAL ONE (Back)     Patient location during evaluation: PACU Anesthesia Type: General Level of consciousness: sedated Pain management: pain level controlled Vital Signs Assessment: post-procedure vital signs reviewed and stable Respiratory status: spontaneous breathing and respiratory function stable Cardiovascular status: stable Postop Assessment: no apparent nausea or vomiting Anesthetic complications: no   No notable events documented.  Last Vitals:  Vitals:   03/24/21 1630 03/24/21 1633  BP: (!) 108/54 (!) 108/54  Pulse: 83 74  Resp: 20 (!) 9  Temp: 36.4 C   SpO2: 94% 95%    Last Pain:  Vitals:   03/24/21 1633  TempSrc:   PainSc: Asleep    LLE Motor Response: Purposeful movement (03/24/21 1630) LLE Sensation: Full sensation (03/24/21 1630) RLE Motor Response: Purposeful movement (03/24/21 1630) RLE Sensation: Full sensation (03/24/21 1630)      Timi Reeser DANIEL

## 2021-03-24 NOTE — Brief Op Note (Signed)
03/24/2021  3:21 PM  PATIENT:  Angela Massey  45 y.o. female  PRE-OPERATIVE DIAGNOSIS:  Spondylolisthesis  POST-OPERATIVE DIAGNOSIS:  Spondylolisthesis  PROCEDURE:  Procedure(s): POSTERIOR LUMBAR INTERBODY FUSION - LUMBAR FOUR-LUMBAR FIVE,  LUMBAR FIVE-SACRAL ONE (N/A)  SURGEON:  Surgeon(s) and Role:    * Brian Kocourek, Mallie Mussel, MD - Primary    * Ashok Pall, MD - Assisting  PHYSICIAN ASSISTANT:   ASSISTANTSMearl Latin   ANESTHESIA:   general  EBL:  300 mL   BLOOD ADMINISTERED:none  DRAINS: none   LOCAL MEDICATIONS USED:  MARCAINE     SPECIMEN:  No Specimen  DISPOSITION OF SPECIMEN:  N/A  COUNTS:  YES  TOURNIQUET:  * No tourniquets in log *  DICTATION: .Dragon Dictation  PLAN OF CARE: Admit to inpatient   PATIENT DISPOSITION:  PACU - hemodynamically stable.   Delay start of Pharmacological VTE agent (>24hrs) due to surgical blood loss or risk of bleeding: yes

## 2021-03-24 NOTE — H&P (Signed)
Angela Massey is an 45 y.o. female.   Chief Complaint: Back pain HPI: 45 year old female with chronic and worsening lumbar pain with radiation to her lower extremity.  Symptoms have decompensated recently with the patient developing severe intractable left lower extremity pain.  She has some associated numbness.  She has no bowel or bladder dysfunction.  She has been found to have a grossly unstable L4-5 motion segment with critical spinal stenosis and severe facet arthropathy.  She also has a broad-based leftward L5-S1 disc herniation with associated spondylolisthesis.  Patient presents now for two-level lumbar decompression and fusion in hopes of improving her symptoms.  Past Medical History:  Diagnosis Date   Anemia    Anxiety    Arthritis    back and ankles   Depression    Fatty liver    Fibromyalgia    GERD (gastroesophageal reflux disease)    Headache    Hypertriglyceridemia    Ovarian abscess    PCOS (polycystic ovarian syndrome)    PONV (postoperative nausea and vomiting)    Pseudotumor    Sciatica     Past Surgical History:  Procedure Laterality Date   Diagnostic Laparotomy     LAPAROSCOPIC GASTRIC SLEEVE RESECTION  10/31/2019   planter facitis     TUBAL LIGATION      Family History  Problem Relation Age of Onset   Diabetes Maternal Grandmother    Depression Mother    Anxiety disorder Mother    Alcohol abuse Father    Social History:  reports that she has been smoking cigarettes. She has a 2.50 pack-year smoking history. She has never used smokeless tobacco. She reports that she does not currently use alcohol. She reports that she does not use drugs.  Allergies:  Allergies  Allergen Reactions   Codeine Hives   Phenergan [Promethazine Hcl]     convulsions   Seroquel [Quetiapine Fumarate]     Caused severe hypotension   Sulfa Antibiotics Hives    Medications Prior to Admission  Medication Sig Dispense Refill   acetaminophen (TYLENOL) 650 MG CR tablet  Take 1,300 mg by mouth every 8 (eight) hours as needed for pain.     aspirin-acetaminophen-caffeine (EXCEDRIN MIGRAINE) 250-250-65 MG tablet Take 2 tablets by mouth every 6 (six) hours as needed for headache.     DULoxetine (CYMBALTA) 60 MG capsule Take 2 capsules (120 mg total) by mouth at bedtime. (Patient taking differently: Take 120 mg by mouth every morning.) 60 capsule 0   ferrous sulfate 325 (65 FE) MG tablet Take 650 mg by mouth See admin instructions. Take 650 mg daily for the first 3 days of cycle     FLUoxetine (PROZAC) 20 MG capsule Take 20 mg by mouth daily. Take with 40 mg to equal 60 mg daily     FLUoxetine (PROZAC) 40 MG capsule Take 40 mg by mouth daily. Take with 20 mg to equal 60 mg daily     gabapentin (NEURONTIN) 800 MG tablet Take 800 mg by mouth 3 (three) times daily.     lamoTRIgine (LAMICTAL) 200 MG tablet Take 200 mg by mouth 2 (two) times daily.     lidocaine (XYLOCAINE) 2 % solution Use as directed 15 mLs in the mouth or throat every 4 (four) hours as needed for mouth pain. 300 mL 0   Melatonin 10 MG CAPS Take 10 mg by mouth See admin instructions. Take 10 mg at bedtime as needed for sleep, may take a second 10 mg dose  during the night as needed     morphine (MS CONTIN) 15 MG 12 hr tablet Take 15 mg by mouth in the morning, at noon, and at bedtime.     neomycin-bacitracin-polymyxin (NEOSPORIN) ointment Apply 1 application topically as needed for wound care.     omeprazole (PRILOSEC) 40 MG capsule Take 40 mg by mouth daily.     Oxycodone HCl 10 MG TABS Take 10 mg by mouth every 4 (four) hours as needed for breakthrough pain.     tiZANidine (ZANAFLEX) 2 MG tablet Take 2-4 mg by mouth 4 (four) times daily as needed for muscle spasms. Max 4 tabs     acetaZOLAMIDE (DIAMOX) 250 MG tablet Take 1 tablet (250 mg total) by mouth 2 (two) times daily. (Patient not taking: No sig reported) 60 tablet 12   ALPRAZolam (XANAX) 0.5 MG tablet for sedation before MRI scan; take 1 tab 1 hour  before scan; may repeat 1 tab 15 min before scan (Patient not taking: No sig reported) 3 tablet 0   cyclobenzaprine (FLEXERIL) 10 MG tablet Take 0.5-1 tablets (5-10 mg total) by mouth 2 (two) times daily as needed for muscle spasms. (Patient not taking: No sig reported) 20 tablet 0   meloxicam (MOBIC) 15 MG tablet Take 1 tablet (15 mg total) by mouth daily. (Patient not taking: No sig reported) 20 tablet 0   methylPREDNISolone (MEDROL DOSEPAK) 4 MG TBPK tablet Use as directed (Patient not taking: No sig reported) 21 tablet 0   predniSONE (DELTASONE) 20 MG tablet 3 tabs po day one, then 2 tabs daily x 4 days (Patient not taking: No sig reported) 11 tablet 0    No results found for this or any previous visit (from the past 48 hour(s)). No results found.  Pertinent items noted in HPI and remainder of comprehensive ROS otherwise negative.  Blood pressure 136/73, pulse 71, temperature 98.7 F (37.1 C), temperature source Oral, resp. rate 17, height 5' 1.5" (1.562 m), weight 89.4 kg, SpO2 100 %.  Patient is awake and alert.  She is oriented and appropriate.  She is obviously quite uncomfortable.  Examination head ears eyes nose throat is unremarked.  Chest and abdomen are moderately obese but otherwise benign.  Extremities are free from injury or deformity.  Neurologically her cranial nerve function is normal bilateral.  Motor examination reveals some mild weakness of plantarflexion of the left lower extremity otherwise motor strength intact.  Sensory examination with decrease sensation pinprick light touch in her left S1 dermatome.  Deep tendon versus her hypoactive at both patella and absent at each level but otherwise normal.  Gait is severely antalgic.  Posture is flexed.   Assessment/Plan Unstable grade 2 L45 degenerative spondylolisthesis with critical stenosis, grade 1 L5-S1 degenerative spondylolisthesis with a leftward L5-S1 disc herniation and radiculopathy.  Plan bilateral L4-5 and L5-S1  decompressive laminotomies and foraminotomies followed by posterior lumbar MRI fusion utilizing interbody cages, local harvested autograft, and augmented with posterior arthrodesis utilizing segmental pedicle screw fixation local autografting.  Risks and benefits been explained.  Patient wishes to proceed.  Mallie Mussel A Ercell Razon 03/24/2021, 11:00 AM

## 2021-03-25 DIAGNOSIS — M4316 Spondylolisthesis, lumbar region: Secondary | ICD-10-CM | POA: Diagnosis not present

## 2021-03-25 MED ORDER — KETOROLAC TROMETHAMINE 30 MG/ML IJ SOLN
INTRAMUSCULAR | Status: AC
  Filled 2021-03-25: qty 1

## 2021-03-25 MED ORDER — KETOROLAC TROMETHAMINE 10 MG PO TABS: 10.0000 mg | ORAL_TABLET | Freq: Four times a day (QID) | ORAL | 0 refills | Status: DC | PRN

## 2021-03-25 MED ORDER — KETOROLAC TROMETHAMINE 30 MG/ML IJ SOLN: 30.0000 mg | Freq: Once | INTRAMUSCULAR | Status: AC

## 2021-03-25 MED ORDER — KETOROLAC TROMETHAMINE 30 MG/ML IJ SOLN
30.0000 mg | Freq: Once | INTRAMUSCULAR | Status: AC
  Administered 2021-03-25: 30 mg via INTRAVENOUS

## 2021-03-25 NOTE — Progress Notes (Signed)
Patient ID: Angela Massey, female   DOB: [birth year], 45 y.o.   MRN: [id number] Vital signs are stable Motor function appears good Pain is under poor control Patient wishes to go home We will add some Toradol IV to see if this will help with her pain control Patient has narcotic meds at home for her baseline.

## 2021-03-25 NOTE — Discharge Instructions (Signed)
Wound Care Keep incision covered and dry for 2-3 days.   Do not put any creams, lotions, or ointments on incision. Leave steri-strips on back.  They will fall off by themselves. Activity Walk each and every day, increasing distance each day. No lifting greater than 5 lbs.  Avoid excessive neck motion. No driving for 2 weeks; may ride as a passenger locally. If provided with back brace, wear when out of bed.  It is not necessary to wear brace in bed. Diet Resume your normal diet.  Return to Work Will be discussed at you follow up appointment. Call Your Doctor If Any of These Occur Redness, drainage, or swelling at the wound.  Temperature greater than 101 degrees. Severe pain not relieved by pain medication. Incision starts to come apart. Follow Up Appt Call today for appointment in 1-2 weeks ([phone]) or for problems.  If you have any hardware placed in your spine, you will need an x-ray before your appointment.

## 2021-03-25 NOTE — Discharge Summary (Signed)
Physician Discharge Summary  Patient ID: Angela Massey MRN: [id number] DOB/AGE: [birth year] 45 y.o.  Admit date: 03/24/2021 Discharge date: 03/25/2021  Admission Diagnoses: Spondylolisthesis L4-5 L5-S1 with lumbar radiculopathy  Discharge Diagnoses: Spondylolisthesis L4-5 L5-S1 with lumbar radiculopathy Active Problems:   Degenerative spondylolisthesis   Discharged Condition: good  Hospital Course: Patient underwent surgical decompression arthrodesis L4 to sacrum and is tolerating surgery well  Consults: None  Significant Diagnostic Studies: None  Treatments: surgery: See op note  Discharge Exam: Blood pressure 105/60, pulse 77, temperature 98.6 F (37 C), temperature source Oral, resp. rate 18, height 5' 1.5" (1.562 m), weight 89.4 kg, SpO2 100 %. Incision is clean and dry motor function is intact Station and gait are intact.  Disposition: Discharge disposition: 01-Home or Self Care      Discharge Instructions     Call MD for:  redness, tenderness, or signs of infection (pain, swelling, redness, odor or green/yellow discharge around incision site)   Complete by: As directed    Call MD for:  severe uncontrolled pain   Complete by: As directed    Call MD for:  temperature >100.4   Complete by: As directed    Diet - low sodium heart healthy   Complete by: As directed    Increase activity slowly   Complete by: As directed    Leave dressing on - Keep it clean, dry, and intact until clinic visit   Complete by: As directed       Allergies as of 03/25/2021       Reactions   Codeine Hives   Phenergan [promethazine Hcl]    convulsions   Seroquel [quetiapine Fumarate]    Caused severe hypotension   Sulfa Antibiotics Hives        Medication List     TAKE these medications    acetaminophen 650 MG CR tablet Commonly known as: TYLENOL Take 1,300 mg by mouth every 8 (eight) hours as needed for pain.   acetaZOLAMIDE 250 MG tablet Commonly known as: DIAMOX Take  1 tablet (250 mg total) by mouth 2 (two) times daily.   ALPRAZolam 0.5 MG tablet Commonly known as: XANAX for sedation before MRI scan; take 1 tab 1 hour before scan; may repeat 1 tab 15 min before scan   aspirin-acetaminophen-caffeine 250-250-65 MG tablet Commonly known as: EXCEDRIN MIGRAINE Take 2 tablets by mouth every 6 (six) hours as needed for headache.   cyclobenzaprine 10 MG tablet Commonly known as: FLEXERIL Take 0.5-1 tablets (5-10 mg total) by mouth 2 (two) times daily as needed for muscle spasms.   DULoxetine 60 MG capsule Commonly known as: CYMBALTA Take 2 capsules (120 mg total) by mouth at bedtime. What changed: when to take this   ferrous sulfate 325 (65 FE) MG tablet Take 650 mg by mouth See admin instructions. Take 650 mg daily for the first 3 days of cycle   FLUoxetine 40 MG capsule Commonly known as: PROZAC Take 40 mg by mouth daily. Take with 20 mg to equal 60 mg daily   FLUoxetine 20 MG capsule Commonly known as: PROZAC Take 20 mg by mouth daily. Take with 40 mg to equal 60 mg daily   gabapentin 800 MG tablet Commonly known as: NEURONTIN Take 800 mg by mouth 3 (three) times daily.   ketorolac 10 MG tablet Commonly known as: TORADOL Take 1 tablet (10 mg total) by mouth every 6 (six) hours as needed for severe pain.   lamoTRIgine 200 MG tablet Commonly known as:  LAMICTAL Take 200 mg by mouth 2 (two) times daily.   lidocaine 2 % solution Commonly known as: XYLOCAINE Use as directed 15 mLs in the mouth or throat every 4 (four) hours as needed for mouth pain.   Melatonin 10 MG Caps Take 10 mg by mouth See admin instructions. Take 10 mg at bedtime as needed for sleep, may take a second 10 mg dose during the night as needed   meloxicam 15 MG tablet Commonly known as: Mobic Take 1 tablet (15 mg total) by mouth daily.   methylPREDNISolone 4 MG Tbpk tablet Commonly known as: MEDROL DOSEPAK Use as directed   morphine 15 MG 12 hr tablet Commonly  known as: MS CONTIN Take 15 mg by mouth in the morning, at noon, and at bedtime.   neomycin-bacitracin-polymyxin ointment Commonly known as: NEOSPORIN Apply 1 application topically as needed for wound care.   omeprazole 40 MG capsule Commonly known as: PRILOSEC Take 40 mg by mouth daily.   Oxycodone HCl 10 MG Tabs Take 10 mg by mouth every 4 (four) hours as needed for breakthrough pain.   predniSONE 20 MG tablet Commonly known as: DELTASONE 3 tabs po day one, then 2 tabs daily x 4 days   tiZANidine 2 MG tablet Commonly known as: ZANAFLEX Take 2-4 mg by mouth 4 (four) times daily as needed for muscle spasms. Max 4 tabs               Durable Medical Equipment  (From admission, onward)           Start     Ordered   03/24/21 1727  DME Walker rolling  Once       Question:  Patient needs a walker to treat with the following condition  Answer:  Degenerative spondylolisthesis   03/24/21 1726   03/24/21 1727  DME 3 n 1  Once        03/24/21 1726              Discharge Care Instructions  (From admission, onward)           Start     Ordered   03/25/21 0000  Leave dressing on - Keep it clean, dry, and intact until clinic visit        03/25/21 0656             Signed: Earleen Newport 03/25/2021, 6:59 AM

## 2021-03-25 NOTE — Plan of Care (Signed)
Patient alert and oriented, mae's well, voiding adequate amount of urine, swallowing without difficulty, no c/o pain at time of discharge. Patient discharged home with family. Script and discharged instructions given to patient. Patient and family stated understanding of instructions given. Patient has an appointment with Dr. Pool  

## 2021-03-25 NOTE — Evaluation (Signed)
Physical Therapy Evaluation and Discharge Patient Details Name: Angela Massey MRN: [id number] DOB: 09/22/1975 Today's Date: 03/25/2021  History of Present Illness  Pt is a 45 y/o female admitted on 9/23 s/p PLIF L4-5, L5-S1. PMH includes: anemia, anxiety, arthritis, fibromyalgia, PCOS, sciaticia.   Clinical Impression  Patient evaluated by Physical Therapy with no further acute PT needs identified. All education has been completed and the patient has no further questions. Pt was able to demonstrate transfers and ambulation with gross modified independence to supervision for safety and no AD. Pt was educated on precautions, brace application/wearing schedule, appropriate activity progression, and car transfer. See below for any follow-up Physical Therapy or equipment needs. PT is signing off. Thank you for this referral.        Recommendations for follow up therapy are one component of a multi-disciplinary discharge planning process, led by the attending physician.  Recommendations may be updated based on patient status, additional functional criteria and insurance authorization.  Follow Up Recommendations No PT follow up;Supervision - Intermittent    Equipment Recommendations  None recommended by PT    Recommendations for Other Services       Precautions / Restrictions Precautions Precautions: Back Precaution Booklet Issued: Yes (comment) Precaution Comments: reviewed with pt Required Braces or Orthoses: Spinal Brace Spinal Brace: Lumbar corset;Applied in sitting position Restrictions Weight Bearing Restrictions: No      Mobility  Bed Mobility Overal bed mobility: Modified Independent Bed Mobility: Rolling;Sidelying to Sit           General bed mobility comments: No assist required. Pt demonstrated proper log roll technique without difficulty.    Transfers Overall transfer level: Needs assistance   Transfers: Sit to/from Stand Sit to Stand: Supervision          General transfer comment: Guarded with increased time due to pain. Pt able to maintain good posture throughout.  Ambulation/Gait Ambulation/Gait assistance: Supervision Gait Distance (Feet): 500 Feet Assistive device: None Gait Pattern/deviations: Step-through pattern;Decreased stride length Gait velocity: Decreased Gait velocity interpretation: <1.8 ft/sec, indicate of risk for recurrent falls General Gait Details: Slow and guarded. No unsteadiness or LOB noted however antalgic due to L kneecap dislocation and pain.  Stairs            Wheelchair Mobility    Modified Rankin (Stroke Patients Only)       Balance Overall balance assessment: Mild deficits observed, not formally tested                                           Pertinent Vitals/Pain Pain Assessment: Faces Faces Pain Scale: Hurts little more Pain Location: back Pain Descriptors / Indicators: Discomfort;Operative site guarding Pain Intervention(s): Limited activity within patient's tolerance;Monitored during session;Repositioned    Home Living Family/patient expects to be discharged to:: Private residence Living Arrangements: Children Available Help at Discharge: Family;Available 24 hours/day Type of Home: House Home Access: Stairs to enter   [employer] of Steps: 4 Home Layout: One level Home Equipment: None      Prior Function Level of Independence: Independent               Hand Dominance        Extremity/Trunk Assessment   Upper Extremity Assessment Upper Extremity Assessment: Defer to OT evaluation    Lower Extremity Assessment Lower Extremity Assessment: Generalized weakness;LLE deficits/detail (Consistent with pre-op diagnosis) LLE Deficits /  Details: Pt reports L kneecap dislocated at baseline    Cervical / Trunk Assessment Cervical / Trunk Assessment: Other exceptions Cervical / Trunk Exceptions: s/p back surgery  Communication    Communication: No difficulties  Cognition Arousal/Alertness: Awake/alert Behavior During Therapy: WFL for tasks assessed/performed Overall Cognitive Status: Within Functional Limits for tasks assessed                                        General Comments      Exercises     Assessment/Plan    PT Assessment Patent does not need any further PT services  PT Problem List         PT Treatment Interventions      PT Goals (Current goals can be found in the Care Plan section)  Acute Rehab PT Goals Patient Stated Goal: home PT Goal Formulation: All assessment and education complete, DC therapy    Frequency     Barriers to discharge        Co-evaluation               AM-PAC PT "6 Clicks" Mobility  Outcome Measure Help needed turning from your back to your side while in a flat bed without using bedrails?: None Help needed moving from lying on your back to sitting on the side of a flat bed without using bedrails?: None Help needed moving to and from a bed to a chair (including a wheelchair)?: None Help needed standing up from a chair using your arms (e.g., wheelchair or bedside chair)?: A Little Help needed to walk in hospital room?: A Little Help needed climbing 3-5 steps with a railing? : A Little 6 Click Score: 21    End of Session Equipment Utilized During Treatment: Gait belt;Back brace Activity Tolerance: Patient tolerated treatment well Patient left: Other (comment) (Sitting EOB with OT Present) Nurse Communication: Mobility status PT Visit Diagnosis: Unsteadiness on feet (R26.81);Pain Pain - part of body:  (back)    Time: 1540-0867 PT Time Calculation (min) (ACUTE ONLY): 21 min   Charges:   PT Evaluation $PT Eval Low Complexity: 1 Low          Rolinda Roan, PT, DPT Acute Rehabilitation Services Pager: [phone] Office: [phone]   Thelma Comp 03/25/2021, 10:15 AM

## 2021-03-25 NOTE — Evaluation (Signed)
Occupational Therapy Evaluation Patient Details Name: Angela Massey MRN: [id number] DOB: 05-Jan-1976 Today's Date: 03/25/2021   History of Present Illness Pt is a 45 y/o female admitted on 9/23 s/p PLIF L4-5, L5-S1. PMH includes: anemia, anxiety, arthritis, fibromyalgia, PCOS, sciaticia.   Clinical Impression   PTA patient independent with ADLs, mobility. Admitted for above and presenting with back pain and precautions, decreased activity tolerance. Educated on brace mgmt and wear schedule, ADL compensatory techniques, DME/AE, back precautions and activity progression. Patient demonstrates ability to complete ADLs with supervision, mobility in room with supervision. Educated on use of 3:1 as shower chair. Pt reports having assist as needed at dc.  No further OT needs identified, pt with no further questions or concerns at this time and OT will sign off.      Recommendations for follow up therapy are one component of a multi-disciplinary discharge planning process, led by the attending physician.  Recommendations may be updated based on patient status, additional functional criteria and insurance authorization.   Follow Up Recommendations  No OT follow up;Supervision - Intermittent    Equipment Recommendations  3 in 1 bedside commode    Recommendations for Other Services       Precautions / Restrictions Precautions Precautions: Back Precaution Booklet Issued: Yes (comment) Precaution Comments: reviewed with pt Required Braces or Orthoses: Spinal Brace Spinal Brace: Lumbar corset;Applied in sitting position Restrictions Weight Bearing Restrictions: No      Mobility Bed Mobility               General bed mobility comments: EOB upon entry    Transfers Overall transfer level: Needs assistance   Transfers: Sit to/from Stand Sit to Stand: Supervision         General transfer comment: good posture and technique, supervisio nfor safety    Balance Overall balance  assessment: Mild deficits observed, not formally tested                                         ADL either performed or assessed with clinical judgement   ADL Overall ADL's : Needs assistance/impaired     Grooming: Supervision/safety;Standing       Lower Body Bathing: Supervison/ safety;Sit to/from stand;Sitting/lateral leans Lower Body Bathing Details (indicate cue type and reason): educated on seated bathing with supervision, using long sponge Upper Body Dressing : Supervision/safety;Set up;Sitting   Lower Body Dressing: Supervision/safety;Set up;Sit to/from stand;Cueing for back precautions;Cueing for compensatory techniques Lower Body Dressing Details (indicate cue type and reason): figure 4 technique for LB dressing, transfers with supervision Toilet Transfer: Supervision/safety;Ambulation   Toileting- Clothing Manipulation and Hygiene: Supervision/safety;Sit to/from stand Toileting - Clothing Manipulation Details (indicate cue type and reason): educated on use of toileting tongs (pt ordered on [place] educated on compensatory techniques to complete standing Tub/ Shower Transfer: Tub transfer;Supervision/safety;Ambulation;3 in 1 Tub/Shower Transfer Details (indicate cue type and reason): educated on 3:1 as shower chair and setup, simulated transfers with UE support with supervision Functional mobility during ADLs: Supervision/safety       Vision         Perception     Praxis      Pertinent Vitals/Pain Pain Assessment: Faces Faces Pain Scale: Hurts little more Pain Location: back Pain Descriptors / Indicators: Discomfort;Operative site guarding Pain Intervention(s): Limited activity within patient's tolerance;Monitored during session;Repositioned     Hand Dominance     Extremity/Trunk Assessment Upper  Extremity Assessment Upper Extremity Assessment: Overall WFL for tasks assessed   Lower Extremity Assessment Lower Extremity Assessment: Defer  to PT evaluation   Cervical / Trunk Assessment Cervical / Trunk Assessment: Other exceptions Cervical / Trunk Exceptions: s/p back surgery   Communication Communication Communication: No difficulties   Cognition Arousal/Alertness: Awake/alert Behavior During Therapy: WFL for tasks assessed/performed Overall Cognitive Status: Within Functional Limits for tasks assessed                                     General Comments       Exercises     Shoulder Instructions      Home Living Family/patient expects to be discharged to:: Private residence Living Arrangements: Children Available Help at Discharge: Family;Available 24 hours/day Type of Home: House Home Access: Stairs to enter [employer] of Steps: 4   Home Layout: One level     Bathroom Shower/Tub: Teacher, early years/pre: Standard                Prior Functioning/Environment Level of Independence: Independent                 OT Problem List: Decreased activity tolerance;Decreased safety awareness;Decreased knowledge of use of DME or AE;Decreased knowledge of precautions;Pain      OT Treatment/Interventions:      OT Goals(Current goals can be found in the care plan section) Acute Rehab OT Goals Patient Stated Goal: home OT Goal Formulation: With patient  OT Frequency:     Barriers to D/C:            Co-evaluation              AM-PAC OT "6 Clicks" Daily Activity     Outcome Measure Help from another person eating meals?: None Help from another person taking care of personal grooming?: A Little Help from another person toileting, which includes using toliet, bedpan, or urinal?: A Little Help from another person bathing (including washing, rinsing, drying)?: A Little Help from another person to put on and taking off regular upper body clothing?: A Little Help from another person to put on and taking off regular lower body clothing?: A Little 6 Click  Score: 19   End of Session Equipment Utilized During Treatment: Back brace Nurse Communication: Mobility status  Activity Tolerance: Patient tolerated treatment well Patient left: with call bell/phone within reach;Other (comment) (seated EOB)  OT Visit Diagnosis: Other abnormalities of gait and mobility (R26.89);Pain Pain - part of body:  (back)                Time: 7741-2878 OT Time Calculation (min): 21 min Charges:  OT General Charges $OT Visit: 1 Visit OT Evaluation $OT Eval Low Complexity: 1 Low  Jolaine Artist, OT Acute Rehabilitation Services Pager [phone] Office [phone]   Delight Stare 03/25/2021, 8:32 AM

## 2021-03-27 ENCOUNTER — Other Ambulatory Visit: Payer: Self-pay

## 2021-03-27 ENCOUNTER — Encounter (HOSPITAL_COMMUNITY): Payer: Self-pay | Admitting: *Deleted

## 2021-03-27 ENCOUNTER — Emergency Department (HOSPITAL_COMMUNITY)
Admission: EM | Admit: 2021-03-27 | Discharge: 2021-03-27 | Disposition: A | Discharge status: Home / Self Care | Payer: Managed Care, Other (non HMO) | Attending: Student | Admitting: Student

## 2021-03-27 DIAGNOSIS — Z5321 Procedure and treatment not carried out due to patient leaving prior to being seen by health care provider: Secondary | ICD-10-CM | POA: Insufficient documentation

## 2021-03-27 DIAGNOSIS — M549 Dorsalgia, unspecified: Secondary | ICD-10-CM | POA: Diagnosis not present

## 2021-03-27 MED ORDER — OXYCODONE HCL 5 MG PO TABS: 10.0000 mg | ORAL_TABLET | Freq: Once | ORAL | Status: DC

## 2021-03-27 MED FILL — Thrombin For Soln 20000 Unit: CUTANEOUS | Qty: 1 | Status: AC

## 2021-03-27 NOTE — ED Notes (Signed)
Pt left AMA °

## 2021-03-27 NOTE — ED Provider Notes (Signed)
Emergency Medicine Provider Triage Evaluation Note  Angela Massey , a 45 y.o. female  was evaluated in triage.  Pt complains of back pain that acutely worsened tonight. Patient states that she felt increased pain to her lower back with some bleeding from her surgical site. Pain has persisted, worse with movement, no alleviating factors. Underwent posterior lumbar interbody fusion by Dr. Annette Stable 03/24/21   Review of Systems  Positive: Back pain, bleeding Negative: Numbness, weakness, fever  Physical Exam  BP (!) 102/52   Pulse 82   Temp 98.7 F (37.1 C) (Oral)   Resp 16   Ht 5\' 1"  (1.549 m)   Wt 89.4 kg   LMP 02/24/2021   SpO2 97%   BMI 37.24 kg/m  Gen:   Awake, no distress   Resp:  Normal effort  MSK:   Moves extremities without difficulty  Other:  Back: Surgical bandage in place to L spine region, dried blood to inferior bandage, no active bleeding. Bandage left in place during triage assessment. Bruising superiorly with some swelling. No erythema/purulent drainage. Diffusely tender throughout the lumbar region.   Medical Decision Making  Medically screening exam initiated at 2:15 AM.  Appropriate orders placed.  Angela Massey was informed that the remainder of the evaluation will be completed by another provider, this initial triage assessment does not replace that evaluation, and the importance of remaining in the ED until their evaluation is complete.  Deferred further exam of patient's surgical site to acute care bed, will hold off on imaging at this time discussed w/ attending- in agreement.    Leafy Kindle 03/27/21 0218    Maudie Flakes, MD 03/27/21 [phone]

## 2021-03-27 NOTE — ED Triage Notes (Signed)
The pt is c/o back pain she had back surgery this past Friday.  She has oxycodone and mporphine and toradol for pain  lmp last month

## 2021-03-28 MED FILL — Sodium Chloride IV Soln 0.9%: INTRAVENOUS | Qty: 1000 | Status: AC

## 2021-03-28 MED FILL — Heparin Sodium (Porcine) Inj 1000 Unit/ML: INTRAMUSCULAR | Qty: 30 | Status: AC

## 2021-04-24 ENCOUNTER — Encounter: Payer: Self-pay | Admitting: Diagnostic Neuroimaging

## 2021-04-24 ENCOUNTER — Ambulatory Visit: Payer: Medicare Other | Admitting: Diagnostic Neuroimaging

## 2021-09-20 DIAGNOSIS — M5416 Radiculopathy, lumbar region: Secondary | ICD-10-CM | POA: Insufficient documentation

## 2021-11-29 ENCOUNTER — Ambulatory Visit
Admission: RE | Admit: 2021-11-29 | Discharge: 2021-11-29 | Disposition: A | Discharge status: Home / Self Care | Payer: Managed Care, Other (non HMO) | Source: Ambulatory Visit | Attending: Physical Medicine and Rehabilitation | Admitting: Physical Medicine and Rehabilitation

## 2021-11-29 ENCOUNTER — Other Ambulatory Visit: Payer: Self-pay | Admitting: Physical Medicine and Rehabilitation

## 2021-11-29 DIAGNOSIS — M25512 Pain in left shoulder: Secondary | ICD-10-CM

## 2021-11-30 ENCOUNTER — Other Ambulatory Visit (HOSPITAL_COMMUNITY): Payer: Self-pay

## 2021-11-30 MED ORDER — OXYCODONE HCL 10 MG PO TABS
ORAL_TABLET | ORAL | 0 refills | Status: DC
  Filled 2021-11-30 (×2): qty 180, 30d supply, fill #0

## 2021-12-01 ENCOUNTER — Other Ambulatory Visit (HOSPITAL_COMMUNITY): Payer: Self-pay

## 2021-12-14 ENCOUNTER — Emergency Department (HOSPITAL_COMMUNITY)
Admission: EM | Admit: 2021-12-14 | Discharge: 2021-12-14 | Disposition: A | Discharge status: Home / Self Care | Payer: Managed Care, Other (non HMO) | Attending: Student | Admitting: Student

## 2021-12-14 ENCOUNTER — Encounter (HOSPITAL_COMMUNITY): Payer: Self-pay | Admitting: *Deleted

## 2021-12-14 ENCOUNTER — Emergency Department (HOSPITAL_COMMUNITY): Payer: Managed Care, Other (non HMO)

## 2021-12-14 ENCOUNTER — Other Ambulatory Visit: Payer: Self-pay

## 2021-12-14 DIAGNOSIS — Y990 Civilian activity done for income or pay: Secondary | ICD-10-CM | POA: Insufficient documentation

## 2021-12-14 DIAGNOSIS — M25512 Pain in left shoulder: Secondary | ICD-10-CM | POA: Insufficient documentation

## 2021-12-14 DIAGNOSIS — G8929 Other chronic pain: Secondary | ICD-10-CM | POA: Diagnosis not present

## 2021-12-14 DIAGNOSIS — X503XXA Overexertion from repetitive movements, initial encounter: Secondary | ICD-10-CM | POA: Insufficient documentation

## 2021-12-14 MED ORDER — METHYLPREDNISOLONE SODIUM SUCC 125 MG IJ SOLR
125.0000 mg | Freq: Once | INTRAMUSCULAR | Status: AC
  Administered 2021-12-14: 125 mg via INTRAMUSCULAR
  Filled 2021-12-14: qty 2

## 2021-12-14 MED ORDER — KETOROLAC TROMETHAMINE 30 MG/ML IJ SOLN
30.0000 mg | Freq: Once | INTRAMUSCULAR | Status: AC
  Administered 2021-12-14: 30 mg via INTRAMUSCULAR
  Filled 2021-12-14: qty 1

## 2021-12-14 NOTE — ED Triage Notes (Addendum)
Pt with left shoulder pain and felt a pop while putting on a shirt. Pt with left shoulder pain before and was told she has frozen shoulder.  Pt felt a pop today and now pain radiates down arm and to finger tips.

## 2021-12-14 NOTE — ED Provider Notes (Signed)
[facility] EMERGENCY DEPARTMENT Provider Note   CSN: [id number] Arrival date & time: 12/14/21  1358     History  Chief Complaint  Patient presents with   Shoulder Pain    Angela Massey is a 46 y.o. female diagnosed with left shoulder calcific tendinosis presenting today with left shoulder pain.  Angela Massey says that Angela Massey saw physical therapy and has not been able to get back in with them but has been doing physical therapy exercises at home.  Today Angela Massey was at work, vacuuming in gym, when Angela Massey started to feel increased pain in her left shoulder.  Angela Massey admits that Angela Massey was overexerting herself so Angela Massey rested and got better.  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  Denies any numbness or tingling.  Is able to shrug her shoulders but Angela Massey says that any movement of the arm is almost intolerable.  Is currently at the end of a 10-day course of prednisone for her shoulder pain from her pain doctor.  Says that the prednisone is doing nothing.   Shoulder Pain      Home Medications Prior to Admission medications   Medication Sig Start Date End Date Taking? Authorizing Provider  acetaminophen (TYLENOL) 650 MG CR tablet Take 1,300 mg by mouth every 8 (eight) hours as needed for pain.    [provider]  acetaZOLAMIDE (DIAMOX) 250 MG tablet Take 1 tablet (250 mg total) by mouth 2 (two) times daily. Patient not taking: No sig reported 12/20/20   Penumalli, Earlean Polka, MD  ALPRAZolam Duanne Moron) 0.5 MG tablet for sedation before MRI scan; take 1 tab 1 hour before scan; may repeat 1 tab 15 min before scan Patient not taking: No sig reported 12/20/20   Penumalli, Earlean Polka, MD  aspirin-acetaminophen-caffeine (EXCEDRIN MIGRAINE) [phone] MG tablet Take 2 tablets by mouth every 6 (six) hours as needed for headache.    [provider]  cyclobenzaprine (FLEXERIL) 10 MG tablet Take 0.5-1 tablets (5-10 mg total) by mouth 2  (two) times daily as needed for muscle spasms. Patient not taking: No sig reported 03/10/21   Margarita Mail, PA-C  DULoxetine (CYMBALTA) 60 MG capsule Take 2 capsules (120 mg total) by mouth at bedtime. Patient taking differently: Take 120 mg by mouth every morning. 05/13/20   Cristofano, Dorene Ar, MD  ferrous sulfate 325 (65 FE) MG tablet Take 650 mg by mouth See admin instructions. Take 650 mg daily for the first 3 days of cycle    [provider]  FLUoxetine (PROZAC) 20 MG capsule Take 20 mg by mouth daily. Take with 40 mg to equal 60 mg daily    [provider]  FLUoxetine (PROZAC) 40 MG capsule Take 40 mg by mouth daily. Take with 20 mg to equal 60 mg daily    [provider]  gabapentin (NEURONTIN) 800 MG tablet Take 800 mg by mouth 3 (three) times daily.    [provider]  ketorolac (TORADOL) 10 MG tablet Take 1 tablet (10 mg total) by mouth every 6 (six) hours as needed for severe pain. 03/25/21   Kristeen Miss, MD  lamoTRIgine (LAMICTAL) 200 MG tablet Take 200 mg by mouth 2 (two) times daily.    [provider]  lidocaine (XYLOCAINE) 2 % solution Use as directed 15 mLs in the mouth or throat every 4 (four) hours as needed for mouth pain. 11/03/20  Mesner, Corene Cornea, MD  Melatonin 10 MG CAPS Take 10 mg by mouth See admin instructions. Take 10 mg at bedtime as needed for sleep, may take a second 10 mg dose during the night as needed    [provider]  meloxicam (MOBIC) 15 MG tablet Take 1 tablet (15 mg total) by mouth daily. Patient not taking: No sig reported 03/10/21   Margarita Mail, PA-C  methylPREDNISolone (MEDROL DOSEPAK) 4 MG TBPK tablet Use as directed Patient not taking: No sig reported 03/10/21   Margarita Mail, PA-C  morphine (MS CONTIN) 15 MG 12 hr tablet Take 15 mg by mouth in the morning, at noon, and at bedtime.    [provider]  neomycin-bacitracin-polymyxin (NEOSPORIN) ointment Apply 1 application topically as needed  for wound care.    [provider]  omeprazole (PRILOSEC) 40 MG capsule Take 40 mg by mouth daily.    [provider]  Oxycodone HCl 10 MG TABS Take 10 mg by mouth every 4 (four) hours as needed for breakthrough pain. 03/01/21   [provider]  Oxycodone HCl 10 MG TABS Take 1/2 - 1 tablet by mouth every 4 hours as needed for pain 11/30/21     predniSONE (DELTASONE) 20 MG tablet 3 tabs po day one, then 2 tabs daily x 4 days Patient not taking: No sig reported 02/28/21   Domenic Moras, PA-C  tiZANidine (ZANAFLEX) 2 MG tablet Take 2-4 mg by mouth 4 (four) times daily as needed for muscle spasms. Max 4 tabs    [provider]      Allergies    Codeine, Phenergan [promethazine hcl], Seroquel [quetiapine fumarate], and Sulfa antibiotics    Review of Systems   Review of Systems  Physical Exam Updated Vital Signs BP 140/87 (BP Location: Right Arm)   Pulse 90   Temp 98.1 F (36.7 C) (Oral)   Resp 17   Ht '5\' 1"'$  (1.549 m)   LMP 11/28/2021   SpO2 100%   BMI 37.24 kg/m  Physical Exam Vitals and nursing note reviewed.  Constitutional:      Appearance: Normal appearance.  HENT:     Head: Normocephalic and atraumatic.  Eyes:     General: No scleral icterus.    Conjunctiva/sclera: Conjunctivae normal.  Pulmonary:     Effort: Pulmonary effort is normal. No respiratory distress.  Musculoskeletal:        General: Tenderness (Tenderness to the humeral head, triceps and biceps) present. No swelling or deformity.     Comments: No obvious deformity to the left upper extremity.  Strong radial pulse, full range of motion of the digits and sensation also intact.  Skin:    General: Skin is warm and dry.     Findings: No rash.  Neurological:     Mental Status: Angela Massey is alert.  Psychiatric:        Mood and Affect: Mood normal.     ED Results / Procedures / Treatments   Labs (all labs ordered are listed, but only abnormal results are displayed) Labs Reviewed - No  data to display  EKG None  Radiology DG Shoulder Left  Result Date: 12/14/2021 CLINICAL DATA:  Acute left shoulder pain. EXAM: LEFT SHOULDER - 2+ VIEW COMPARISON:  Nov 29, 2021. FINDINGS: There is no evidence of fracture or dislocation. There is no evidence of arthropathy or other focal bone abnormality. Soft tissues are unremarkable. IMPRESSION: Negative. Electronically Signed   By: Marijo Conception M.D.   On:  12/14/2021 15:15    Procedures Procedures   Medications Ordered in ED Medications  ketorolac (TORADOL) 30 MG/ML injection 30 mg (30 mg Intramuscular Given 12/14/21 1438)    ED Course/ Medical Decision Making/ A&P                           Medical Decision Making Amount and/or Complexity of Data Reviewed Radiology: ordered.  Risk Prescription drug management.   This patient presents to the ED for concern of left shoulder pain.  Started when Angela Massey was putting on a shirt.  Includes but is not limited to dislocation, tendinitis, rotator cuff injury, joint effusion, septic space.   This is not an exhaustive differential.    Past Medical History / Co-morbidities / Social History: Calcific tendinosis in the left shoulder is already known   Additional history: Last month patient presented with left shoulder pain.  Calcific tendinosis was noted on her scan.  Angela Massey started to do physical therapy at home from [employer].   Physical Exam: Pertinent physical exam findings include hesitancy to move the left shoulder and arm secondary to pain.,  Patient passive range of motion intact.  No deformities.    Imaging Studies: I ordered and independently visualized and interpreted shoulder x-ray which showed no abnormalities.  No calcific tendinosis noted by me or radiologist. I agree with the radiologist interpretation.    Medications: I ordered medication including Toradol and Solu-Medrol.  Patient already says it is.  Angela Massey is frustrated that Angela Massey was only given 10 mg.  Willing to  give her a Solu-Medrol shot although I believe Angela Massey needs intra-articular injections.   Disposition: After consideration of the diagnostic results and the patients response to treatment, I feel that patient is neurovascularly intact and stable to follow-up with outpatient orthopedics.  Given a referral.  Angela Massey will not be prescribed any further medications that Angela Massey is already on oxycodone chronically.  We discussed rice therapy and Angela Massey will continue to avoid NSAIDs due to her previous bariatric surgery.  Angela Massey is agreeable to this plan, has been supplied a work note and discharged home   Final Clinical Impression(s) / ED Diagnoses Final diagnoses:  Chronic left shoulder pain  Overuse injury    Rx / DC Orders  Results and diagnoses were explained to the patient. Return precautions discussed in full. Patient had no additional questions and expressed complete understanding.   This chart was dictated using voice recognition software.  Despite best efforts to proofread,  errors can occur which can change the documentation meaning.    Darliss Ridgel 12/14/21 [place], [place], MD 12/15/21 [phone]

## 2021-12-14 NOTE — Discharge Instructions (Addendum)
Read the information about overuse injuries attached to these discharge papers.  You may take your tizanidine more regularly to see if it helps relax your muscles.  Ultimately, follow-up with the orthopedic doctor attached to these discharge papers.

## 2021-12-19 ENCOUNTER — Encounter: Payer: Self-pay | Admitting: Orthopedic Surgery

## 2021-12-19 ENCOUNTER — Ambulatory Visit (INDEPENDENT_AMBULATORY_CARE_PROVIDER_SITE_OTHER): Payer: Managed Care, Other (non HMO) | Admitting: Orthopedic Surgery

## 2021-12-19 VITALS — BP 138/81 | HR 74 | Ht 61.5 in | Wt 200.0 lb

## 2021-12-19 DIAGNOSIS — M7502 Adhesive capsulitis of left shoulder: Secondary | ICD-10-CM

## 2021-12-19 DIAGNOSIS — M25512 Pain in left shoulder: Secondary | ICD-10-CM

## 2021-12-19 NOTE — Patient Instructions (Signed)
Ok to return to work, no restrictions  Exercises below, as tolerated  Sling as needed  Let us know if you have issues scheduling an appointment   Rotator Cuff Tear/Tendinitis Rehab   Ask your health care provider which exercises are safe for you. Do exercises exactly as told by your health care provider and adjust them as directed. It is normal to feel mild stretching, pulling, tightness, or discomfort as you do these exercises. Stop right away if you feel sudden pain or your pain gets worse. Do not begin these exercises until told by your health care provider. Stretching and range-of-motion exercises  These exercises warm up your muscles and joints and improve the movement and flexibility of your shoulder. These exercises also help to relieve pain.  Shoulder pendulum In this exercise, you let the injured arm dangle toward the floor and then swing it like a clock pendulum. Stand near a table or counter that you can hold onto for balance. Bend forward at the waist and let your left / right arm hang straight down. Use your other arm to support you and help you stay balanced. Relax your left / right arm and shoulder muscles, and move your hips and your trunk so your left / right arm swings freely. Your arm should swing because of the motion of your body, not because you are using your arm or shoulder muscles. Keep moving your hips and trunk so your arm swings in the following directions, as told by your health care provider: Side to side. Forward and backward. In clockwise and counterclockwise circles. Slowly return to the starting position. Repeat 10 times, or for 10 seconds per direction. Complete this exercise 2-3 times a day.      Shoulder flexion, seated This exercise is sometimes called table slides. In this exercise, you raise your arm in front of your body until you feel a stretch in your injured shoulder. Sit in a stable chair so your left / right forearm can rest on a flat  surface. Your elbow should rest at a height that keeps your upper arm next to your body. Keeping your left / right shoulder relaxed, lean forward at the waist and let your hand slide forward (flexion). Stop when you feel a stretch in your shoulder, or when you reach the angle that is recommended by your health care provider. Hold for 5 seconds. Slowly return to the starting position. Repeat 10 times. Complete this exercise 1-2  times a day.       Shoulder flexion, standing In this exercise, you raise your arm in front of your body (flexion) until you feel a stretch in your injured shoulder. Stand and hold a broomstick, a cane, or a similar object. Place your hands a little more than shoulder-width apart on the object. Your left / right hand should be palm-up, and your other hand should be palm-down. Keep your elbow straight and your shoulder muscles relaxed. Push the stick up with your healthy arm to raise your left / right arm in front of your body, and then over your head until you feel a stretch in your shoulder. Avoid shrugging your shoulder while you raise your arm. Keep your shoulder blade tucked down toward the middle of your back. Keep your left / right shoulder muscles relaxed. Hold for 10 seconds. Slowly return to the starting position. Repeat 10 times. Complete this exercise 1-2 times a day.      Shoulder abduction, active-assisted You will need a stick, broom  handle, or similar object to help you (assist) in doing this exercise. Lie on your back. This is the supine position. Hold a broomstick, a cane, or a similar object. Place your hands a little more than shoulder-width apart on the object. Your left / right hand should be palm-up, and your other hand should be palm-down. Keeping your shoulder relaxed, push the stick to raise your left / right arm out to your side (abduction) and then over your head. Use your other hand to help move the stick. Stop when you feel a stretch in your  shoulder, or when you reach the angle that is recommended by your health care provider. Avoid shrugging your shoulder while you raise your arm. Keep your shoulder blade tucked down toward the middle of your back. Hold for 10 seconds. Slowly return to the starting position. Repeat 10 times. Complete this exercise 1-2 times a day.      Shoulder flexion, active-assisted Lie on your back. You may bend your knees for comfort. Hold a broomstick, a cane, or a similar object so that your hands are about shoulder-width apart. Your palms should face toward your feet. Raise your left / right arm over your head, then behind your head toward the floor (flexion). Use your other hand to help you do this (active-assisted). Stop when you feel a gentle stretch in your shoulder, or when you reach the angle that is recommended by your health care provider. Hold for 10 seconds. Use the stick and your other arm to help you return your left / right arm to the starting position. Repeat 10 times. Complete this exercise 1-2 times a day.      External rotation Sit in a stable chair without armrests, or stand up. Tuck a soft object, such as a folded towel or a small ball, under your left / right upper arm. Hold a broomstick, a cane, or a similar object with your palms face-down, toward the floor. Bend your elbows to a 90-degree angle (right angle), and keep your hands about shoulder-width apart. Straighten your healthy arm and push the stick across your body, toward your left / right side. Keep your left / right arm bent. This will rotate your left / right forearm away from your body (external rotation). Hold for 10 seconds. Slowly return to the starting position. Repeat 10 times. Complete this exercise 1-2 times a day.        Strengthening exercises These exercises build strength and endurance in your shoulder. Endurance is the ability to use your muscles for a long time, even after they get tired. Do not start  doing these exercises until your health care provider approves. Shoulder flexion, isometric Stand or sit in a doorway, facing the door frame. Keep your left / right arm straight and make a gentle fist with your hand. Place your fist against the door frame. Only your fist should be touching the frame. Keep your upper arm at your side. Gently press your fist against the door frame, as if you are trying to raise your arm above your head (isometric shoulder flexion). Avoid shrugging your shoulder while you press your hand into the door frame. Keep your shoulder blade tucked down toward the middle of your back. Hold for 10 seconds. Slowly release the tension, and relax your muscles completely before you repeat the exercise. Repeat 10 times. Complete this exercise 3 times per week.      Shoulder abduction, isometric Stand or sit in a doorway. Your left /  right arm should be closest to the door frame. Keep your left / right arm straight, and place the back of your hand against the door frame. Only your hand should be touching the frame. Keep the rest of your arm close to your side. Gently press the back of your hand against the door frame, as if you are trying to raise your arm out to the side (isometric shoulder abduction). Avoid shrugging your shoulder while you press your hand into the door frame. Keep your shoulder blade tucked down toward the middle of your back. Hold for 10 seconds. Slowly release the tension, and relax your muscles completely before you repeat the exercise. Repeat 10 times. Complete this exercise 3 times per week.      Internal rotation, isometric This is an exercise in which you press your palm against a door frame without moving your shoulder joint (isometric). Stand or sit in a doorway, facing the door frame. Bend your left / right elbow, and place the palm of your hand against the door frame. Only your palm should be touching the frame. Keep your upper arm at your  side. Gently press your hand against the door frame, as if you are trying to push your arm toward your abdomen (internal rotation). Gradually increase the pressure until you are pressing as hard as you can. Stop increasing the pressure if you feel shoulder pain. Avoid shrugging your shoulder while you press your hand into the door frame. Keep your shoulder blade tucked down toward the middle of your back. Hold for 10 seconds. Slowly release the tension, and relax your muscles completely before you repeat the exercise. Repeat 10 times. Complete this exercise 3 times per week.      External rotation, isometric This is an exercise in which you press the back of your wrist against a door frame without moving your shoulder joint (isometric). Stand or sit in a doorway, facing the door frame. Bend your left / right elbow and place the back of your wrist against the door frame. Only the back of your wrist should be touching the frame. Keep your upper arm at your side. Gently press your wrist against the door frame, as if you are trying to push your arm away from your abdomen (external rotation). Gradually increase the pressure until you are pressing as hard as you can. Stop increasing the pressure if you feel pain. Avoid shrugging your shoulder while you press your wrist into the door frame. Keep your shoulder blade tucked down toward the middle of your back. Hold for 10 seconds. Slowly release the tension, and relax your muscles completely before you repeat the exercise. Repeat 10 times. Complete this exercise 3 times per week.       Scapular retraction Sit in a stable chair without armrests, or stand up. Secure an exercise band to a stable object in front of you so the band is at shoulder height. Hold one end of the exercise band in each hand. Your palms should face down. Squeeze your shoulder blades together (retraction) and move your elbows slightly behind you. Do not shrug your shoulders upward  while you do this. Hold for 10 seconds. Slowly return to the starting position. Repeat 10 times. Complete this exercise 3 times per week.      Shoulder extension Sit in a stable chair without armrests, or stand up. Secure an exercise band to a stable object in front of you so the band is above shoulder height. Hold one  end of the exercise band in each hand. Straighten your elbows and lift your hands up to shoulder height. Squeeze your shoulder blades together and pull your hands down to the sides of your thighs (extension). Stop when your hands are straight down by your sides. Do not let your hands go behind your body. Hold for 10 seconds. Slowly return to the starting position. Repeat 10 times. Complete this exercise 3 times per week.       Scapular protraction, supine Lie on your back on a firm surface (supine position). Hold a 5 lbs (or soup can) weight in your left / right hand. Raise your left / right arm straight into the air so your hand is directly above your shoulder joint. Push the weight into the air so your shoulder (scapula) lifts off the surface that you are lying on. The scapula will push up or forward (protraction). Do not move your head, neck, or back. Hold for 10 seconds. Slowly return to the starting position. Let your muscles relax completely before you repeat this exercise.  Repeat 10 times. Complete this exercise 3 times per week.

## 2021-12-20 ENCOUNTER — Encounter: Payer: Self-pay | Admitting: Orthopedic Surgery

## 2021-12-20 NOTE — Progress Notes (Signed)
New Patient Visit  Assessment: Angela Massey is a 46 y.o. female with the following: 1. Adhesive capsulitis of left shoulder  Plan: Angela Massey has pain and limited motion, consistent with adhesive capsulitis.  Atraumatic onset.  This diagnosis was discussed with the patient.  Treatment options were reviewed.  She would like to proceed with a high-volume steroid injection.  We have placed a referral for her to see Dr. Ernestina Patches.  She will follow-up in clinic as needed.  Follow-up: Return for Referral to Dr. Ernestina Patches for high volume steroid injection.  Subjective:  Chief Complaint  Patient presents with   Shoulder Pain    Left very painful can not lift fork to feed herself its so painful at times     History of Present Illness: Angela Massey is a 46 y.o. female who presents for evaluation of left shoulder pain.  She has had pain in the left shoulder for couple of months.  No specific injury.  She notes sudden onset of pain, with progressive worsening.  She felt she overdid it at work.  She also did have a popping sensation after the initial onset of pain.  She is now very restricted in her ability to use the left arm.  It is painful for her to work.  Medications have not worked.  She has been wearing a sling, states it feels better in a sling.  She has been limited at work.    Review of Systems: No fevers or chills No numbness or tingling No chest pain No shortness of breath No bowel or bladder dysfunction No GI distress No headaches   Medical History:  Past Medical History:  Diagnosis Date   Anemia    Anxiety    Arthritis    back and ankles   Depression    Fatty liver    Fibromyalgia    GERD (gastroesophageal reflux disease)    Headache    Hypertriglyceridemia    Ovarian abscess    PCOS (polycystic ovarian syndrome)    PONV (postoperative nausea and vomiting)    Pseudotumor    Sciatica     Past Surgical History:  Procedure Laterality Date   Diagnostic  Laparotomy     LAPAROSCOPIC GASTRIC SLEEVE RESECTION  10/31/2019   planter facitis     TUBAL LIGATION      Family History  Problem Relation Age of Onset   Diabetes Maternal Grandmother    Depression Mother    Anxiety disorder Mother    Alcohol abuse Father    Social History   Tobacco Use   Smoking status: Some Days    Packs/day: 0.25    Years: 10.00    Total pack years: 2.50    Types: Cigarettes    Last attempt to quit: 04/17/2017    Years since quitting: 4.6   Smokeless tobacco: Never  Vaping Use   Vaping Use: Every day   Substances: Nicotine  Substance Use Topics   Alcohol use: Not Currently    Alcohol/week: 0.0 standard drinks of alcohol   Drug use: No    Allergies  Allergen Reactions   Codeine Hives   Phenergan [Promethazine Hcl]     convulsions   Seroquel [Quetiapine Fumarate]     Caused severe hypotension   Sulfa Antibiotics Hives    Current Meds  Medication Sig   acetaminophen (TYLENOL) 650 MG CR tablet Take 1,300 mg by mouth every 8 (eight) hours as needed for pain.   aspirin-acetaminophen-caffeine ([place]) [id number]  MG tablet Take 2 tablets by mouth every 6 (six) hours as needed for headache.   DULoxetine (CYMBALTA) 60 MG capsule Take 2 capsules (120 mg total) by mouth at bedtime. (Patient taking differently: Take 120 mg by mouth every morning.)   FLUoxetine (PROZAC) 20 MG capsule Take 20 mg by mouth daily. Take with 40 mg to equal 60 mg daily   FLUoxetine (PROZAC) 40 MG capsule Take 40 mg by mouth daily. Take with 20 mg to equal 60 mg daily   gabapentin (NEURONTIN) 800 MG tablet Take 800 mg by mouth 3 (three) times daily.   lamoTRIgine (LAMICTAL) 200 MG tablet Take 200 mg by mouth 2 (two) times daily.   lidocaine (XYLOCAINE) 2 % solution Use as directed 15 mLs in the mouth or throat every 4 (four) hours as needed for mouth pain.   Melatonin 10 MG CAPS Take 10 mg by mouth See admin instructions. Take 10 mg at bedtime as needed for sleep, may  take a second 10 mg dose during the night as needed   morphine (MS CONTIN) 15 MG 12 hr tablet Take 15 mg by mouth in the morning, at noon, and at bedtime.   neomycin-bacitracin-polymyxin (NEOSPORIN) ointment Apply 1 application topically as needed for wound care.   omeprazole (PRILOSEC) 40 MG capsule Take 40 mg by mouth daily.   Oxycodone HCl 10 MG TABS Take 10 mg by mouth every 4 (four) hours as needed for breakthrough pain.   Oxycodone HCl 10 MG TABS Take 1/2 - 1 tablet by mouth every 4 hours as needed for pain   tiZANidine (ZANAFLEX) 2 MG tablet Take 2-4 mg by mouth 4 (four) times daily as needed for muscle spasms. Max 4 tabs   [DISCONTINUED] ALPRAZolam (XANAX) 0.5 MG tablet for sedation before MRI scan; take 1 tab 1 hour before scan; may repeat 1 tab 15 min before scan   [DISCONTINUED] cyclobenzaprine (FLEXERIL) 10 MG tablet Take 0.5-1 tablets (5-10 mg total) by mouth 2 (two) times daily as needed for muscle spasms.   [DISCONTINUED] divalproex (DEPAKOTE) 125 MG DR tablet Take by mouth.   [DISCONTINUED] DULoxetine (CYMBALTA) 60 MG capsule Take 1 capsule by mouth daily.   [DISCONTINUED] ketorolac (TORADOL) 10 MG tablet Take 1 tablet (10 mg total) by mouth every 6 (six) hours as needed for severe pain.   [DISCONTINUED] methylPREDNISolone (MEDROL DOSEPAK) 4 MG TBPK tablet Use as directed   [DISCONTINUED] predniSONE (DELTASONE) 20 MG tablet 3 tabs po day one, then 2 tabs daily x 4 days    Objective: BP 138/81   Pulse 74   Ht 5' 1.5" (1.562 m)   Wt 200 lb (90.7 kg)   LMP 11/28/2021   BMI 37.18 kg/m   Physical Exam:  General: Alert and oriented. and No acute distress. Gait: Normal gait.  Left shoulder without deformity.  No atrophy.  No bruising.  Very restricted range of motion due to pain.  Internal rotation to the side of her hip.  Forward flexion less than 90 degrees.  Limited external rotation at her side.  Fingers are warm and well-perfused.  Sensation is intact throughout the left  hand.  IMAGING:  X-rays of the left shoulder from urgent care demonstrates no acute injury.  No evidence of degenerative changes.  No proximal humeral migration.      New Medications:  No orders of the defined types were placed in this encounter.     Mordecai Rasmussen, MD  12/20/2021 8:22 AM

## 2021-12-25 ENCOUNTER — Telehealth: Payer: Self-pay | Admitting: Physical Medicine and Rehabilitation

## 2021-12-25 NOTE — Telephone Encounter (Signed)
Pt returned call to set appt. Phone number is [phone]

## 2021-12-27 ENCOUNTER — Encounter: Payer: Self-pay | Admitting: Physical Medicine and Rehabilitation

## 2021-12-27 ENCOUNTER — Ambulatory Visit: Payer: Self-pay

## 2021-12-27 ENCOUNTER — Ambulatory Visit: Payer: Managed Care, Other (non HMO) | Admitting: Physical Medicine and Rehabilitation

## 2021-12-27 ENCOUNTER — Other Ambulatory Visit (HOSPITAL_COMMUNITY): Payer: Self-pay

## 2021-12-27 DIAGNOSIS — M7502 Adhesive capsulitis of left shoulder: Secondary | ICD-10-CM

## 2021-12-27 DIAGNOSIS — G8929 Other chronic pain: Secondary | ICD-10-CM

## 2021-12-27 DIAGNOSIS — M25512 Pain in left shoulder: Secondary | ICD-10-CM

## 2021-12-27 MED ORDER — OXYCODONE HCL 10 MG PO TABS
ORAL_TABLET | ORAL | 0 refills | Status: DC
  Filled 2021-12-30: qty 180, 30d supply, fill #0

## 2021-12-27 NOTE — Progress Notes (Signed)
Pt state left shoulder pain. Pt state movement with her left arm makes the pain worse. Pt state she take [place] meds to help ease her pain.  Numeric Pain Rating Scale and Functional Assessment Average Pain 7   In the last MONTH (on 0-10 scale) has pain interfered with the following?  1. General activity like being  able to carry out your everyday physical activities such as walking, climbing stairs, carrying groceries, or moving a chair?  Rating(10)    -BT, -Dye Allergies.

## 2021-12-27 NOTE — Progress Notes (Signed)
   [name] - 46 y.o. female MRN [id number]  Date of birth: [birth year]  Office Visit Note: Visit Date: 12/27/2021 PCP: Margaretha Sheffield, MD Referred by: Margaretha Sheffield, MD  Subjective: Chief Complaint  Patient presents with   Left Shoulder - Pain   HPI:  Angela Massey is a 46 y.o. female who comes in today at the request of Dr. Larena Glassman for planned Left anesthetic glenohumeral arthrogram with fluoroscopic guidance.  The patient has failed conservative care including home exercise, medications, time and activity modification.  This injection will be diagnostic and hopefully therapeutic.  Please see requesting physician notes for further details and justification.    ROS Otherwise per HPI.  Assessment & Plan: Visit Diagnoses:    ICD-10-CM   1. Adhesive capsulitis of left shoulder  M75.02 Large Joint Inj: L glenohumeral    XR C-ARM NO REPORT    2. Chronic left shoulder pain  M25.512 Large Joint Inj: L glenohumeral   G89.29 XR C-ARM NO REPORT      Plan: No additional findings.   Meds & Orders: No orders of the defined types were placed in this encounter.   Orders Placed This Encounter  Procedures   Large Joint Inj: L glenohumeral   XR C-ARM NO REPORT    Follow-up: Return for visit to requesting provider as needed.   Procedures: Large Joint Inj: L glenohumeral on 12/27/2021 8:52 AM Indications: pain and diagnostic evaluation Details: 22 G 3.5 in needle, fluoroscopy-guided anteromedial approach  Arthrogram: No  Medications: 40 mg triamcinolone acetonide 40 MG/ML; 5 mL bupivacaine 0.25 %; 4 mL lidocaine 1 % Outcome: tolerated well, no immediate complications  There was excellent flow of contrast producing a partial arthrogram of the glenohumeral joint. The patient did have relief of symptoms during the anesthetic phase of the injection. Procedure, treatment alternatives, risks and benefits explained, specific risks discussed. Consent was given by the patient.  Immediately prior to procedure a time out was called to verify the correct patient, procedure, equipment, support staff and site/side marked as required. Patient was prepped and draped in the usual sterile fashion.          Clinical History: No specialty comments available.     Objective:  VS:  HT:    WT:   BMI:     BP:   HR: bpm  TEMP: ( )  RESP:  Physical Exam   Imaging: No results found.

## 2021-12-30 ENCOUNTER — Other Ambulatory Visit (HOSPITAL_COMMUNITY): Payer: Self-pay

## 2022-01-24 ENCOUNTER — Other Ambulatory Visit (HOSPITAL_COMMUNITY): Payer: Self-pay

## 2022-01-24 MED ORDER — OXYCODONE HCL 10 MG PO TABS
ORAL_TABLET | ORAL | 0 refills | Status: DC
  Filled 2022-01-31: qty 180, 30d supply, fill #0

## 2022-01-29 MED ORDER — TRIAMCINOLONE ACETONIDE 40 MG/ML IJ SUSP
40.0000 mg | INTRAMUSCULAR | Status: AC | PRN
  Administered 2021-12-27: 40 mg via INTRA_ARTICULAR

## 2022-01-29 MED ORDER — BUPIVACAINE HCL 0.25 % IJ SOLN
5.0000 mL | INTRAMUSCULAR | Status: AC | PRN
  Administered 2021-12-27: 5 mL via INTRA_ARTICULAR

## 2022-01-29 MED ORDER — LIDOCAINE HCL 1 % IJ SOLN
4.0000 mL | INTRAMUSCULAR | Status: AC | PRN
  Administered 2021-12-27: 4 mL

## 2022-01-31 ENCOUNTER — Other Ambulatory Visit (HOSPITAL_COMMUNITY): Payer: Self-pay

## 2022-02-21 ENCOUNTER — Other Ambulatory Visit (HOSPITAL_COMMUNITY): Payer: Self-pay

## 2022-02-21 MED ORDER — OXYCODONE HCL 10 MG PO TABS: ORAL_TABLET | ORAL | 0 refills | Status: DC

## 2022-02-21 MED ORDER — OXYCODONE HCL 10 MG PO TABS
ORAL_TABLET | ORAL | 0 refills | Status: DC
  Filled 2022-02-21 – 2022-03-06 (×2): qty 180, 30d supply, fill #0

## 2022-02-21 MED ORDER — TIZANIDINE HCL 2 MG PO TABS
ORAL_TABLET | ORAL | 2 refills | Status: AC
  Filled 2022-02-21: qty 120, 30d supply, fill #0

## 2022-02-21 MED ORDER — GABAPENTIN 800 MG PO TABS
ORAL_TABLET | ORAL | 0 refills | Status: DC
  Filled 2022-02-21: qty 270, 90d supply, fill #0

## 2022-03-03 ENCOUNTER — Other Ambulatory Visit (HOSPITAL_COMMUNITY): Payer: Self-pay

## 2022-03-06 ENCOUNTER — Other Ambulatory Visit (HOSPITAL_COMMUNITY): Payer: Self-pay

## 2022-04-04 ENCOUNTER — Other Ambulatory Visit (HOSPITAL_COMMUNITY): Payer: Self-pay

## 2022-04-04 MED ORDER — OXYCODONE HCL 10 MG PO TABS
5.0000 mg | ORAL_TABLET | ORAL | 0 refills | Status: DC | PRN
  Filled 2022-04-04: qty 180, 30d supply, fill #0
  Filled 2022-04-05: qty 124, 20d supply, fill #0
  Filled 2022-04-05: qty 56, 10d supply, fill #0

## 2022-04-05 ENCOUNTER — Other Ambulatory Visit (HOSPITAL_COMMUNITY): Payer: Self-pay

## 2022-04-18 ENCOUNTER — Other Ambulatory Visit (HOSPITAL_COMMUNITY): Payer: Self-pay

## 2022-04-18 MED ORDER — OXYCODONE HCL 10 MG PO TABS
5.0000 mg | ORAL_TABLET | ORAL | 0 refills | Status: DC | PRN
  Filled 2022-04-18 – 2022-05-04 (×2): qty 180, 30d supply, fill #0

## 2022-04-27 ENCOUNTER — Emergency Department
Admission: EM | Admit: 2022-04-27 | Discharge: 2022-04-27 | Disposition: A | Discharge status: Home / Self Care | Payer: Medicare Other | Attending: Emergency Medicine | Admitting: Emergency Medicine

## 2022-04-27 ENCOUNTER — Other Ambulatory Visit: Payer: Self-pay

## 2022-04-27 ENCOUNTER — Encounter: Payer: Self-pay | Admitting: Emergency Medicine

## 2022-04-27 DIAGNOSIS — L03211 Cellulitis of face: Secondary | ICD-10-CM | POA: Diagnosis not present

## 2022-04-27 DIAGNOSIS — R220 Localized swelling, mass and lump, head: Secondary | ICD-10-CM | POA: Diagnosis present

## 2022-04-27 MED ORDER — MUPIROCIN 2 % EX OINT: 1.0000 | TOPICAL_OINTMENT | Freq: Two times a day (BID) | CUTANEOUS | 0 refills | Status: DC

## 2022-04-27 MED ORDER — CEPHALEXIN 500 MG PO CAPS: 500.0000 mg | ORAL_CAPSULE | Freq: Three times a day (TID) | ORAL | 0 refills | Status: AC

## 2022-04-27 NOTE — ED Provider Notes (Signed)
[facility] Provider Note    Event Date/Time   First MD Initiated Contact with Patient 04/27/22 1123     (approximate)   History   Facial Swelling   HPI  Angela Massey is a 46 y.o. female with no significant past medical history presents emergency department with a enlarged red area on her chin.  Patient states felt like a pimple and she did not mess with it for a while then last night started to mess with that today her face is swollen surrounding this area.  States feels like her lymph node in her neck is also swollen.  No fever or chills.      Physical Exam   Triage Vital Signs: ED Triage Vitals [04/27/22 1121]  Enc Vitals Group     BP 124/74     Pulse Rate 68     Resp 16     Temp 98.6 F (37 C)     Temp Source Oral     SpO2 99 %     Weight 199 lb 15.3 oz (90.7 kg)     Height 5' 1.5" (1.562 m)     Head Circumference      Peak Flow      Pain Score 5     Pain Loc      Pain Edu?      Excl. in [place]?     Most recent vital signs: Vitals:   04/27/22 1121  BP: 124/74  Pulse: 68  Resp: 16  Temp: 98.6 F (37 C)  SpO2: 99%     General: Awake, no distress.   CV:  Good peripheral perfusion. regular rate and  rhythm Resp:  Normal effort.  Abd:  No distention.   Other:  Large scab noted on the patient's left side of her chin, swelling and induration is noted around the area, no drainable abscess noted   ED Results / Procedures / Treatments   Labs (all labs ordered are listed, but only abnormal results are displayed) Labs Reviewed - No data to display   EKG     RADIOLOGY     PROCEDURES:   Procedures   MEDICATIONS ORDERED IN ED: Medications - No data to display   IMPRESSION / MDM / [place] / ED COURSE  I reviewed the triage vital signs and the nursing notes.                              Differential diagnosis includes, but is not limited to, cellulitis, abscess, acne  Patient's presentation is most  consistent with acute, uncomplicated illness.   The area does appear to be infected so we will place patient on Keflex 500 3 times daily along with Bactroban ointment.  She is to follow-up with her regular doctor if not improving 3 days.  Return emergency department if worsening.  She is discharged stable condition.      FINAL CLINICAL IMPRESSION(S) / ED DIAGNOSES   Final diagnoses:  Facial cellulitis     Rx / DC Orders   ED Discharge Orders          Ordered    cephALEXin (KEFLEX) 500 MG capsule  3 times daily        04/27/22 1128    mupirocin ointment (BACTROBAN) 2 %  2 times daily        04/27/22 1128  Note:  This document was prepared using Dragon voice recognition software and may include unintentional dictation errors.    Versie Starks, PA-C 04/27/22 1133    Duffy Bruce, MD 04/28/22 [phone]

## 2022-04-27 NOTE — ED Triage Notes (Signed)
Pt here with left facial swelling that began yesterday from a possible bite or acne. Pt states the pain has gotten much worse and now her lymph nodes are swollen at well. Pt does not want pain medicine due to her being on a pain contract.

## 2022-04-27 NOTE — Discharge Instructions (Signed)
Follow-up with your regular doctor if not improving 3 days.  Return if worsening.  Take medications as prescribed.  Apply a warm compress to the area

## 2022-05-03 IMAGING — RF DG LUMBAR SPINE 2-3V
1 series · 3 of 3 positions shown · non-contrast
Comparison: 03/16/2021

FLUOROSCOPY TIME:  55 seconds

CLINICAL DATA: Posterior lumbar interbody fusion, L4-L5, L5-S1

EXAM:
LUMBAR SPINE - 2-3 VIEW; DG C-ARM 1-60 MIN-NO REPORT

[Series 1: run · 3 of 3 slices shown]
[im 1/3]
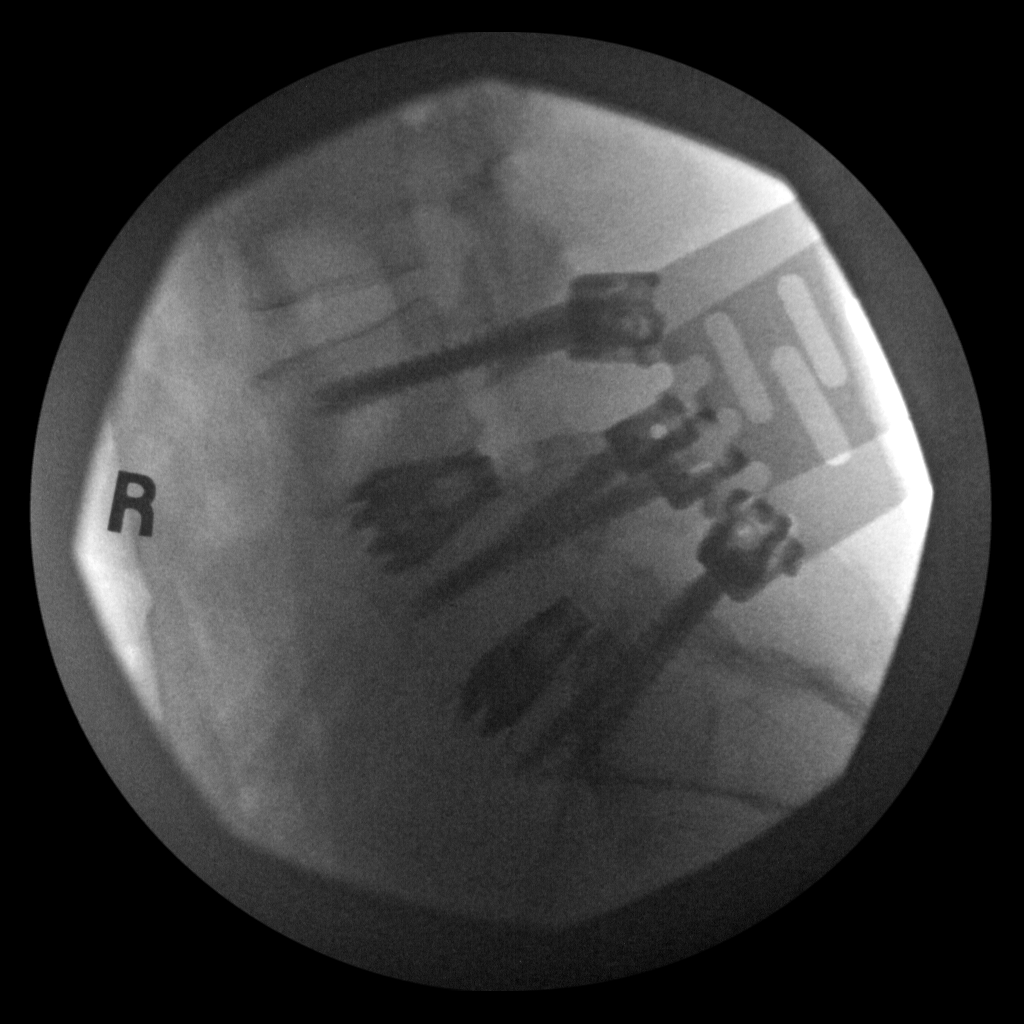
[im 2/3]
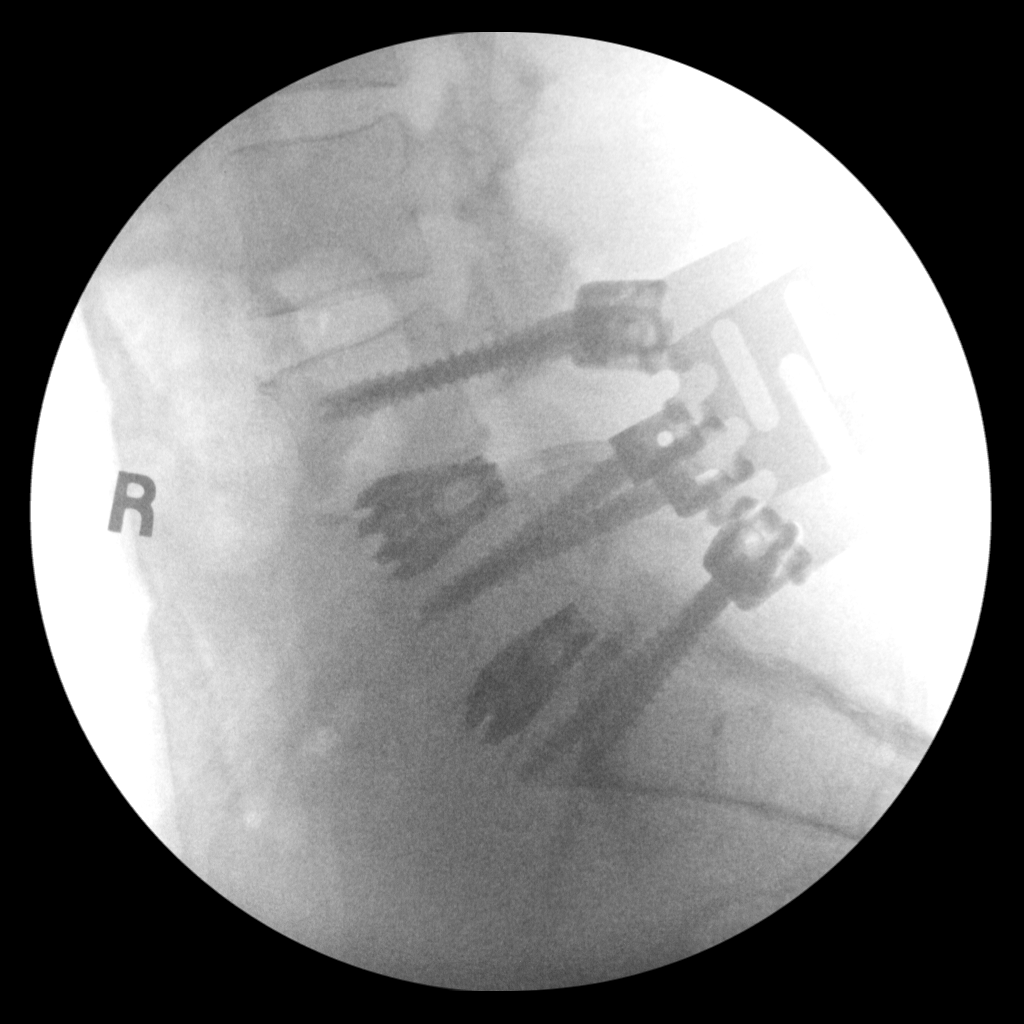
[im 3/3]
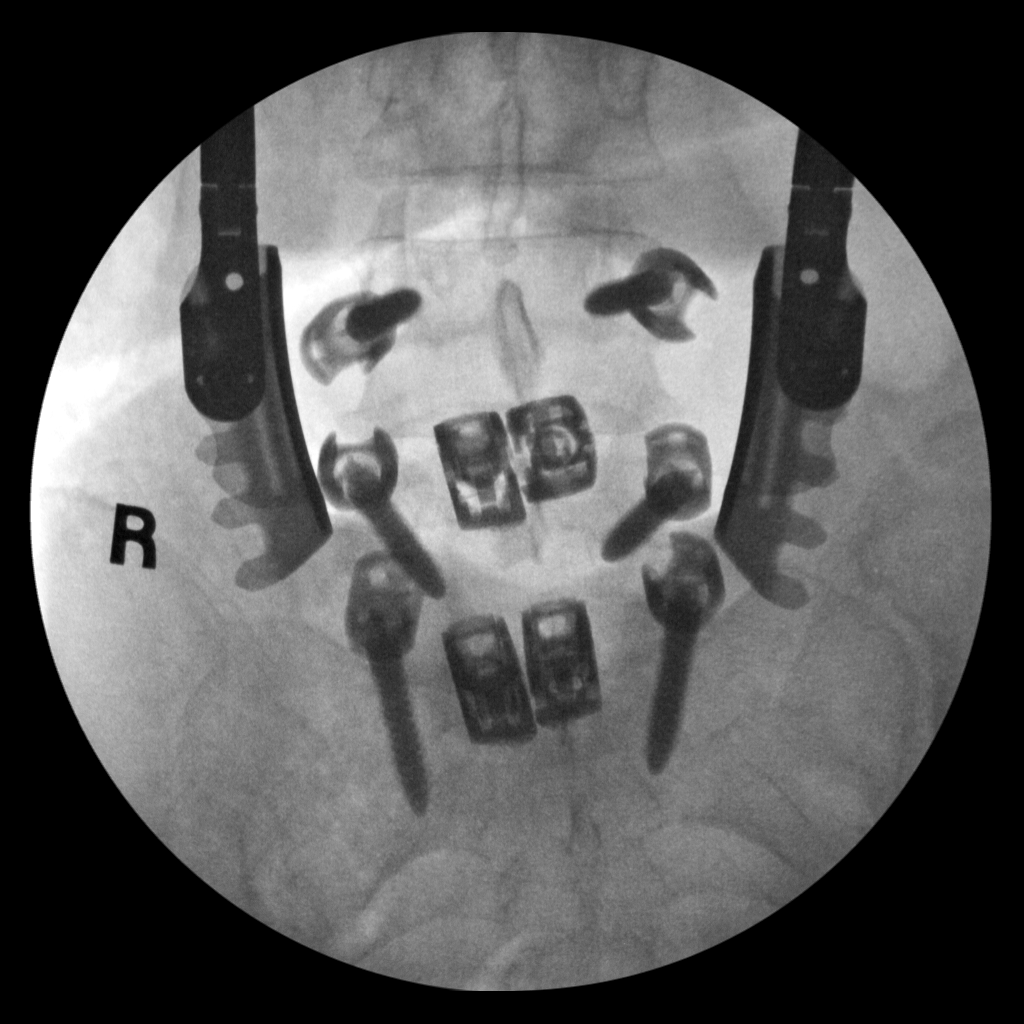

[3 of 3 positions shown; findings below may reference images not displayed]

FINDINGS: Intraoperative fluoroscopic images of the lumbar spine demonstrate
posterior fusion of L4 through S1. Rod hardware is not in place. No
obvious perihardware fracture or component malpositioning.
IMPRESSION: Intraoperative fluoroscopic images of the lumbar spine demonstrate
posterior fusion of L4 through S1. Rod hardware is not in place. No
obvious perihardware fracture or component malpositioning.

## 2022-05-04 ENCOUNTER — Other Ambulatory Visit (HOSPITAL_COMMUNITY): Payer: Self-pay

## 2022-05-17 ENCOUNTER — Other Ambulatory Visit (HOSPITAL_COMMUNITY): Payer: Self-pay

## 2022-05-17 MED ORDER — OXYCODONE HCL 10 MG PO TABS
5.0000 mg | ORAL_TABLET | ORAL | 0 refills | Status: DC | PRN
  Filled 2022-05-17 – 2022-06-02 (×2): qty 180, 30d supply, fill #0

## 2022-06-02 ENCOUNTER — Other Ambulatory Visit (HOSPITAL_COMMUNITY): Payer: Self-pay

## 2022-06-04 ENCOUNTER — Other Ambulatory Visit (HOSPITAL_COMMUNITY): Payer: Self-pay

## 2022-06-14 ENCOUNTER — Other Ambulatory Visit (HOSPITAL_COMMUNITY): Payer: Self-pay

## 2022-06-14 MED ORDER — OXYCODONE HCL 10 MG PO TABS
5.0000 mg | ORAL_TABLET | ORAL | 0 refills | Status: DC | PRN
  Filled 2022-07-03: qty 180, 30d supply, fill #0

## 2022-06-15 ENCOUNTER — Other Ambulatory Visit (HOSPITAL_COMMUNITY): Payer: Self-pay

## 2022-06-28 ENCOUNTER — Other Ambulatory Visit (HOSPITAL_COMMUNITY): Payer: Self-pay

## 2022-07-03 ENCOUNTER — Other Ambulatory Visit: Payer: Self-pay

## 2022-07-03 ENCOUNTER — Other Ambulatory Visit (HOSPITAL_COMMUNITY): Payer: Self-pay

## 2022-07-10 ENCOUNTER — Emergency Department: Payer: No Typology Code available for payment source

## 2022-07-10 ENCOUNTER — Other Ambulatory Visit: Payer: Self-pay

## 2022-07-10 ENCOUNTER — Emergency Department
Admission: EM | Admit: 2022-07-10 | Discharge: 2022-07-10 | Disposition: A | Discharge status: Home / Self Care | Payer: No Typology Code available for payment source | Attending: Emergency Medicine | Admitting: Emergency Medicine

## 2022-07-10 DIAGNOSIS — R1011 Right upper quadrant pain: Secondary | ICD-10-CM | POA: Diagnosis not present

## 2022-07-10 DIAGNOSIS — R110 Nausea: Secondary | ICD-10-CM | POA: Diagnosis not present

## 2022-07-10 LAB — URINALYSIS, ROUTINE W REFLEX MICROSCOPIC
Bilirubin Urine: NEGATIVE
Glucose, UA: NEGATIVE mg/dL
Hgb urine dipstick: NEGATIVE
Ketones, ur: NEGATIVE mg/dL
Leukocytes,Ua: NEGATIVE
Nitrite: NEGATIVE
Protein, ur: NEGATIVE mg/dL
Specific Gravity, Urine: 1.024 (ref 1.005–1.030)
pH: 5 (ref 5.0–8.0)

## 2022-07-10 LAB — COMPREHENSIVE METABOLIC PANEL
ALT: 19 U/L (ref 0–44)
AST: 20 U/L (ref 15–41)
Albumin: 3.8 g/dL (ref 3.5–5.0)
Alkaline Phosphatase: 66 U/L (ref 38–126)
Anion gap: 8 (ref 5–15)
BUN: 19 mg/dL (ref 6–20)
CO2: 26 mmol/L (ref 22–32)
Calcium: 9.2 mg/dL (ref 8.9–10.3)
Chloride: 102 mmol/L (ref 98–111)
Creatinine, Ser: 0.59 mg/dL (ref 0.44–1.00)
GFR, Estimated: >60 mL/min (ref >60)
Glucose, Bld: 86 mg/dL (ref 70–99)
Potassium: 3.8 mmol/L (ref 3.5–5.1)
Sodium: 136 mmol/L (ref 135–145)
Total Bilirubin: 0.4 mg/dL (ref 0.3–1.2)
Total Protein: 6.9 g/dL (ref 6.5–8.1)

## 2022-07-10 LAB — CBC
HCT: 39 % (ref 36.0–46.0)
Hemoglobin: 12.1 g/dL (ref 12.0–15.0)
MCH: 25.2 pg — ABNORMAL LOW (ref 26.0–34.0)
MCHC: 31 g/dL (ref 30.0–36.0)
MCV: 81.3 fL (ref 80.0–100.0)
Platelets: 311 10*3/uL (ref 150–400)
RBC: 4.8 MIL/uL (ref 3.87–5.11)
RDW: 16.3 % — ABNORMAL HIGH (ref 11.5–15.5)
WBC: 9.8 10*3/uL (ref 4.0–10.5)
nRBC: 0 % (ref 0.0–0.2)

## 2022-07-10 LAB — LIPASE, BLOOD: Lipase: 37 U/L (ref 11–51)

## 2022-07-10 LAB — PREGNANCY, URINE: Preg Test, Ur: NEGATIVE

## 2022-07-10 MED ORDER — SUCRALFATE 1 G PO TABS
1.0000 g | ORAL_TABLET | Freq: Once | ORAL | Status: AC
  Administered 2022-07-10: 1 g via ORAL
  Filled 2022-07-10: qty 1

## 2022-07-10 MED ORDER — SUCRALFATE 1 G PO TABS: 1.0000 g | ORAL_TABLET | Freq: Four times a day (QID) | ORAL | 0 refills | Status: DC

## 2022-07-10 NOTE — ED Provider Triage Note (Signed)
Emergency Medicine Provider Triage Evaluation Note  Angela Massey , a 47 y.o. female  was evaluated in triage.  Pt complains of RUQ abdominal pain worse after eating. No vomiting or diarrhea. No fever.  Physical Exam  BP (!) 140/82   Pulse 77   Temp 98.5 F (36.9 C)   Resp 16   Ht '5\' 2"'$  (1.575 m)   Wt 89.8 kg   LMP 06/27/2022   SpO2 96%   BMI 36.21 kg/m  Gen:   Awake, no distress   Resp:  Normal effort  MSK:   Moves extremities without difficulty  Other:    Medical Decision Making  Medically screening exam initiated at 3:24 PM.  Appropriate orders placed.  Angela Massey was informed that the remainder of the evaluation will be completed by another provider, this initial triage assessment does not replace that evaluation, and the importance of remaining in the ED until their evaluation is complete.  Labs, urine, and RUQ [place] ordered.   Victorino Dike, FNP 07/10/22 1529

## 2022-07-10 NOTE — ED Provider Notes (Signed)
[facility] Provider Note   Event Date/Time   First MD Initiated Contact with Patient 07/10/22 1720     (approximate) History  Abdominal Pain  HPI Angela Massey is a 47 y.o. female with a stated past medical history of gastric sleeve surgery 2 years prior to arrival who presents with right upper quadrant abdominal pain that is worse after eating.  Patient also endorses associated mild nausea.  Patient denies any known history of renal stones, gallstones, or liver dysfunction.  Patient states this pain has been present for the last 3 days and has been worsening. ROS: Patient currently denies any vision changes, tinnitus, difficulty speaking, facial droop, sore throat, chest pain, shortness of breath, nausea/vomiting/diarrhea, dysuria, or weakness/numbness/paresthesias in any extremity   Physical Exam  Triage Vital Signs: ED Triage Vitals  Enc Vitals Group     BP 07/10/22 1520 (!) 140/82     Pulse Rate 07/10/22 1520 77     Resp 07/10/22 1520 16     Temp 07/10/22 1520 98.5 F (36.9 C)     Temp Source 07/10/22 1724 Oral     SpO2 07/10/22 1520 96 %     Weight 07/10/22 1518 198 lb (89.8 kg)     Height 07/10/22 1518 '5\' 2"'$  (1.575 m)     Head Circumference --      Peak Flow --      Pain Score 07/10/22 1518 6     Pain Loc --      Pain Edu? --      Excl. in [employer]? --    Most recent vital signs: Vitals:   07/10/22 1724 07/10/22 1800  BP: 120/87 126/79  Pulse: 76 78  Resp: 16 16  Temp: 98.3 F (36.8 C)   SpO2: 94% 94%   General: Awake, oriented x4. CV:  Good peripheral perfusion.  Resp:  Normal effort.  Abd:  No distention.  Other:  Obese middle-aged Caucasian female laying in bed in no acute distress ED Results / Procedures / Treatments  Labs (all labs ordered are listed, but only abnormal results are displayed) Labs Reviewed  CBC - Abnormal; Notable for the following components:      Result Value   MCH 25.2 (*)    RDW 16.3 (*)    All other  components within normal limits  URINALYSIS, ROUTINE W REFLEX MICROSCOPIC - Abnormal; Notable for the following components:   Color, Urine YELLOW (*)    APPearance HAZY (*)    All other components within normal limits  LIPASE, BLOOD  COMPREHENSIVE METABOLIC PANEL  PREGNANCY, URINE  RADIOLOGY ED MD interpretation: Right upper quadrant ultrasound interpreted independently by me and shows no etiology for right upper quadrant abdominal pain -Agree with radiology assessment Official radiology report(s): US Abdomen Limited RUQ (LIVER/GB)  Result Date: 07/10/2022 CLINICAL DATA:  Right upper quadrant pain for 2 days EXAM: ULTRASOUND ABDOMEN LIMITED RIGHT UPPER QUADRANT COMPARISON:  CT renal stone protocol July 29, 2018 FINDINGS: Gallbladder: No gallstones or wall thickening visualized. No sonographic Murphy sign noted by sonographer. Common bile duct: Diameter: 2 mm Liver: No focal lesion identified. Parenchymal echogenicity is within normal limits. Portal vein is patent on color Doppler imaging with normal direction of blood flow towards the liver. Other: None. IMPRESSION: No etiology for right upper quadrant pain identified. Electronically Signed   By: Beryle Flock M.D.   On: 07/10/2022 16:25   PROCEDURES: Critical Care performed: No .1-3 Lead EKG Interpretation  Performed by: Cheri Fowler,  Vista Lawman, MD Authorized by: Naaman Plummer, MD     Interpretation: normal     ECG rate:  71   ECG rate assessment: normal     Rhythm: sinus rhythm     Ectopy: none     Conduction: normal    MEDICATIONS ORDERED IN ED: Medications  sucralfate (CARAFATE) tablet 1 g (1 g Oral Given 07/10/22 1812)   IMPRESSION / MDM / ASSESSMENT AND PLAN / ED COURSE  I reviewed the triage vital signs and the nursing notes.                             The patient is on the cardiac monitor to evaluate for evidence of arrhythmia and/or significant heart rate changes. Patient's presentation is most consistent with acute  presentation with potential threat to life or bodily function. Patients symptoms not typical for emergent causes of abdominal pain such as, but not limited to, appendicitis, abdominal aortic aneurysm, surgical biliary disease, pancreatitis, SBO, mesenteric ischemia, serious intra-abdominal bacterial illness. Presentation also not typical of gynecologic emergencies such as TOA, Ovarian Torsion, PID. Not Ectopic. Doubt atypical ACS. Given history of gastric sleeve surgery as well as postprandial worsening of this pain, I am concerned for possible ulcer.  Patient was encouraged to continue her omeprazole as well as added cervical fate prior to any meals Pt tolerating PO. Disposition: Patient will be discharged with strict return precautions and follow up with primary MD within 12-24 hours for further evaluation. Patient understands that this still may have an early presentation of an emergent medical condition such as appendicitis that will require a recheck.   FINAL CLINICAL IMPRESSION(S) / ED DIAGNOSES   Final diagnoses:  Right upper quadrant abdominal pain  Nausea   Rx / DC Orders   ED Discharge Orders          Ordered    sucralfate (CARAFATE) 1 g tablet  4 times daily        07/10/22 1809           Note:  This document was prepared using Dragon voice recognition software and may include unintentional dictation errors.   Naaman Plummer, MD 07/10/22 [phone]

## 2022-07-10 NOTE — ED Triage Notes (Signed)
Says right side mid abd pain for several day.  Today having a hard time eating.  No fever, says she does have frequency urination, but no dysuria.

## 2022-07-31 ENCOUNTER — Other Ambulatory Visit (HOSPITAL_COMMUNITY): Payer: Self-pay

## 2022-07-31 MED ORDER — OXYCODONE HCL 10 MG PO TABS
5.0000 mg | ORAL_TABLET | ORAL | 0 refills | Status: DC | PRN
  Filled 2022-07-31: qty 180, 30d supply, fill #0

## 2022-08-27 ENCOUNTER — Ambulatory Visit: Payer: Medicare Other | Admitting: Family Medicine

## 2022-08-30 ENCOUNTER — Encounter: Payer: Self-pay | Admitting: Radiology

## 2022-08-30 ENCOUNTER — Other Ambulatory Visit (HOSPITAL_COMMUNITY): Payer: Self-pay

## 2022-08-30 MED ORDER — OXYCODONE HCL 10 MG PO TABS
5.0000 mg | ORAL_TABLET | ORAL | 0 refills | Status: DC | PRN
  Filled 2022-08-30: qty 180, 30d supply, fill #0

## 2022-09-26 ENCOUNTER — Other Ambulatory Visit (HOSPITAL_COMMUNITY): Payer: Self-pay

## 2022-09-26 MED ORDER — OXYCODONE HCL 10 MG PO TABS
ORAL_TABLET | ORAL | 0 refills | Status: DC
  Filled 2022-09-26: qty 180, 30d supply, fill #0

## 2022-10-22 ENCOUNTER — Other Ambulatory Visit (HOSPITAL_COMMUNITY): Payer: Self-pay

## 2022-10-22 MED ORDER — OXYCODONE HCL 10 MG PO TABS
5.0000 mg | ORAL_TABLET | ORAL | 0 refills | Status: DC | PRN
  Filled 2022-10-22 – 2022-10-24 (×2): qty 180, 30d supply, fill #0

## 2022-10-24 ENCOUNTER — Other Ambulatory Visit (HOSPITAL_COMMUNITY): Payer: Self-pay

## 2022-11-16 ENCOUNTER — Emergency Department: Payer: No Typology Code available for payment source

## 2022-11-16 ENCOUNTER — Encounter: Payer: Self-pay | Admitting: Emergency Medicine

## 2022-11-16 ENCOUNTER — Other Ambulatory Visit: Payer: Self-pay

## 2022-11-16 ENCOUNTER — Emergency Department
Admission: EM | Admit: 2022-11-16 | Discharge: 2022-11-16 | Disposition: A | Discharge status: Home / Self Care | Payer: No Typology Code available for payment source | Attending: Emergency Medicine | Admitting: Emergency Medicine

## 2022-11-16 DIAGNOSIS — M25551 Pain in right hip: Secondary | ICD-10-CM | POA: Diagnosis present

## 2022-11-16 DIAGNOSIS — Y9389 Activity, other specified: Secondary | ICD-10-CM | POA: Insufficient documentation

## 2022-11-16 DIAGNOSIS — M7061 Trochanteric bursitis, right hip: Secondary | ICD-10-CM | POA: Diagnosis not present

## 2022-11-16 MED ORDER — LIDOCAINE 5 % EX PTCH
1.0000 | MEDICATED_PATCH | CUTANEOUS | Status: DC
  Administered 2022-11-16: 1 via TRANSDERMAL
  Filled 2022-11-16: qty 1

## 2022-11-16 MED ORDER — DEXAMETHASONE SODIUM PHOSPHATE 10 MG/ML IJ SOLN
10.0000 mg | Freq: Once | INTRAMUSCULAR | Status: AC
  Administered 2022-11-16: 10 mg via INTRAMUSCULAR
  Filled 2022-11-16: qty 1

## 2022-11-16 MED ORDER — PREDNISONE 20 MG PO TABS: 50.0000 mg | ORAL_TABLET | Freq: Once | ORAL | Status: DC

## 2022-11-16 MED ORDER — PREDNISONE 10 MG PO TABS: 10.0000 mg | ORAL_TABLET | Freq: Every day | ORAL | 0 refills | Status: DC

## 2022-11-16 NOTE — ED Triage Notes (Signed)
Pt here with right hip and lower back x1 week. Pt has a L4-S1 back surgery a year ago. Pt denies fall or injury, pt still able to ambulate but states severe pain.

## 2022-11-16 NOTE — ED Provider Notes (Signed)
[facility] Emergency Department Provider Note     Event Date/Time   First MD Initiated Contact with Patient 11/16/22 1229     (approximate)   History   Hip Pain and Back Pain   HPI  Angela Massey is a 47 y.o. female with a past medical history of fibromyalgia presents to the ED for complaint of right hip pain x 1 week.  Pain is worsened with bending forward, walking, and lying down however lying on the painful hip is comforting.  Patient reports her right hip is painful to touch.  She has a history of multiple back surgeries.  She reports this is not nerve pain but completely different.  Denies numbness, tingling, dysuria, urinary frequency, hematuria loss of bladder and bowel control, chest pain and shortness of breath.  No other complaints at this time.     Physical Exam   Triage Vital Signs: ED Triage Vitals  Enc Vitals Group     BP 11/16/22 1109 136/74     Pulse Rate 11/16/22 1109 69     Resp 11/16/22 1109 16     Temp 11/16/22 1109 98.6 F (37 C)     Temp Source 11/16/22 1109 Oral     SpO2 11/16/22 1109 99 %     Weight 11/16/22 1112 200 lb (90.7 kg)     Height 11/16/22 1112 5\' 1"  (1.549 m)     Head Circumference --      Peak Flow --      Pain Score 11/16/22 1115 10     Pain Loc --      Pain Edu? --      Excl. in [place]? --     Most recent vital signs: Vitals:   11/16/22 1109  BP: 136/74  Pulse: 69  Resp: 16  Temp: 98.6 F (37 C)  SpO2: 99%   General: Well appearing. Alert and oriented. INAD.  Skin:  Warm, dry and intact. No rashes or lesions noted.     Head:  NCAT.  Eyes:  PERRLA. EOMI. Conjunctivae clear. Neck:   No cervical spine tenderness to palpation.  Full active range of motion without difficulty.   CV:  Good peripheral perfusion. RRR.  RESP:  Normal effort. LCTAB.   ABD:  No distention. Soft. No tenderness to palpation. No CVA tenderness.  BACK:  Spinous process is midline without deformity or tenderness.  MSK:    Patient freely moves all extremities. NEURO: Cranial nerves II-XII intact. No focal deficits. Sensation and motor function intact.  Gait steady. OTHER: Ecchymosis over trochanteric      ED Results / Procedures / Treatments   Labs (all labs ordered are listed, but only abnormal results are displayed) Labs Reviewed - No data to display  RADIOLOGY  I personally viewed and evaluated these images as part of my medical decision making, as well as reviewing the written report by the radiologist.  ED Provider Interpretation: Hip and CT scan unremarkable  DG Hip Unilat W or Wo Pelvis 2-3 Views Right  Result Date: 11/16/2022 CLINICAL DATA:  Hip and low back pain EXAM: DG HIP (WITH OR WITHOUT PELVIS) 2-3V RIGHT COMPARISON:  CT pelvis 07/29/2018 FINDINGS: No significant hip arthropathy. The bony pelvis appears unremarkable. Prominence of stool in the visualized portion of the pelvic large bowel, constipation not excluded. Posterolateral rod and pedicle screw fixation along with interbody spacers at L4-L5-S1. No abnormal lucency observed along the pedicle screws on the provided frontal projection. IMPRESSION:  1. No significant hip arthropathy. 2. Posterolateral rod and pedicle screw fixation along with interbody spacers at L4-L5-S1. 3. Prominence of stool in the pelvic large bowel, constipation not excluded. Electronically Signed   By: Gaylyn Rong M.D.   On: 11/16/2022 12:18   CT Cervical Spine Wo Contrast  Result Date: 11/16/2022 CLINICAL DATA:  Unspecified neck pain EXAM: CT CERVICAL SPINE WITHOUT CONTRAST TECHNIQUE: Multidetector CT imaging of the cervical spine was performed without intravenous contrast. Multiplanar CT image reconstructions were also generated. RADIATION DOSE REDUCTION: This exam was performed according to the departmental dose-optimization program which includes automated exposure control, adjustment of the mA and/or kV according to patient size and/or use of iterative  reconstruction technique. COMPARISON:  07/31/2020 FINDINGS: Alignment: Reversal of the normal cervical lordosis. Trace anterolisthesis of C4 on C5, unchanged. Skull base and vertebrae: No acute fracture. No primary bone lesion or focal pathologic process. Soft tissues and spinal canal: No prevertebral fluid or swelling. No visible canal hematoma. Disc levels: C2-C3: No significant disc bulge. No spinal canal stenosis or neuroforaminal narrowing. C3-C4: Minimal disc bulge. Right greater than left facet and uncovertebral hypertrophy. No spinal canal stenosis or neural foraminal narrowing. C4-C5: Trace anterolisthesis with disc unroofing and minimal disc bulge. Facet and uncovertebral hypertrophy. No spinal canal stenosis. Moderate left neural foraminal narrowing. C5-C6: Disc height loss with disc osteophyte complex. Uncovertebral hypertrophy. Moderate spinal canal stenosis. Mild left neural foraminal narrowing. C6-C7: Disc height loss with disc osteophyte complex. Uncovertebral hypertrophy. Moderate spinal canal stenosis. Mild-to-moderate bilateral neural foraminal narrowing. C7-T1: No significant disc bulge. Facet and uncovertebral hypertrophy. No spinal canal stenosis or neural foraminal narrowing. Upper chest: No focal pulmonary opacity or pleural effusion. Other: None. IMPRESSION: 1. No acute fracture or traumatic listhesis. 2. C6-C7 moderate spinal canal stenosis and mild-to-moderate bilateral neural foraminal narrowing. 3. C5-C6 moderate spinal canal stenosis and mild left neural foraminal narrowing. 4. Additional moderate left neural foraminal narrowing at C4-C5 and mild left neural foraminal narrowing at C5-C6. Electronically Signed   By: Wiliam Ke M.D.   On: 11/16/2022 11:49     PROCEDURES:  Critical Care performed: No  Procedures   MEDICATIONS ORDERED IN ED: Medications  dexamethasone (DECADRON) injection 10 mg (10 mg Intramuscular Given 11/16/22 1418)     IMPRESSION / MDM / ASSESSMENT AND  PLAN / ED COURSE  I reviewed the triage vital signs and the nursing notes.                              Differential diagnosis includes, but is not limited to, trochanteric bursitis, fracture highly unlikely but considered, sciatica, tendinitis  Patient's presentation is most consistent with acute complicated illness / injury requiring diagnostic workup.  47 year old female presents to the ED for evaluation and treatment of lower back pain and right hip pain.  See HPI for further details.  Patient's diagnosis is consistent with acute right hip pain.  She reports she did not injure her right hip however on physical exam there is mild ecchymosis noted indicating excessive pressure or a blunt force to patient's hip causing a possible soft tissue hematoma.  I discussed this with patient and encouraged her to monitor closely.  Overall, patient is in stable condition for discharge.  Pain improved with dexamethasone injection. patient will be discharged home with prescriptions for short course prednisone. Patient is to follow up with her primary care as needed or otherwise directed. Patient is given ED precautions  to return to the ED for any worsening or new symptoms.     FINAL CLINICAL IMPRESSION(S) / ED DIAGNOSES   Final diagnoses:  Tendonitis in region of greater trochanter of femur, right     Rx / DC Orders   ED Discharge Orders          Ordered    predniSONE (DELTASONE) 10 MG tablet  Daily        11/16/22 1444             Note:  This document was prepared using Dragon voice recognition software and may include unintentional dictation errors.    Romeo Apple, Amory Zbikowski A, PA-C 11/17/22 2057    Sharman Cheek, MD 11/19/22 [phone]

## 2022-11-16 NOTE — ED Notes (Signed)
Pt complains of hip pain that began last week. Pt states that she had lower back surgery 03/2021.

## 2022-11-21 ENCOUNTER — Emergency Department
Admission: EM | Admit: 2022-11-21 | Discharge: 2022-11-21 | Disposition: A | Discharge status: Home / Self Care | Payer: No Typology Code available for payment source | Attending: Orthopedic Surgery | Admitting: Orthopedic Surgery

## 2022-11-21 ENCOUNTER — Other Ambulatory Visit: Payer: Self-pay

## 2022-11-21 DIAGNOSIS — G5701 Lesion of sciatic nerve, right lower limb: Secondary | ICD-10-CM | POA: Insufficient documentation

## 2022-11-21 DIAGNOSIS — M549 Dorsalgia, unspecified: Secondary | ICD-10-CM | POA: Diagnosis present

## 2022-11-21 MED ORDER — PREDNISONE 10 MG PO TABS: ORAL_TABLET | ORAL | 0 refills | Status: DC

## 2022-11-21 NOTE — ED Provider Notes (Signed)
[facility] Provider Note   CSN: [id number] Arrival date & time: 11/21/22  1420      Chief Complaint  Patient presents with   Back Pain    Angela Massey is a 47 y.o. female.  With history of prior back surgery presents to the emergency department for evaluation of right gluteal pain.  Patient has been having ongoing pain over the last week but developed acute pain today at work.  No falls trauma or injury.  Pain focal to the right posterior hip, lateral hip.  No burning numbness tingling or radicular symptoms.  She has been ambulatory but has a lot of pain with sitting and with standing.  No pain with lying down.  She is on a pain contract taking 10 mg oxycodone to 4 hours along with tizanidine.  No loss of bowel or bladder symptoms.  No saddle anesthesia.  HPI     Home Medications Prior to Admission medications   Medication Sig Start Date End Date Taking? Authorizing Provider  predniSONE (DELTASONE) 10 MG tablet 10 day taper. 5,5,4,4,3,3,2,2,1,1 11/21/22  Yes Evon Slack, PA-C  acetaminophen (TYLENOL) 650 MG CR tablet Take 1,300 mg by mouth every 8 (eight) hours as needed for pain.    [provider]  acetaZOLAMIDE (DIAMOX) 250 MG tablet Take 1 tablet (250 mg total) by mouth 2 (two) times daily. 12/20/20   Penumalli, Glenford Bayley, MD  aspirin-acetaminophen-caffeine (EXCEDRIN MIGRAINE) [phone] MG tablet Take 2 tablets by mouth every 6 (six) hours as needed for headache.    [provider]  buPROPion (WELLBUTRIN XL) 150 MG 24 hr tablet Take 150 mg by mouth every morning. 12/06/21   [provider]  DULoxetine (CYMBALTA) 60 MG capsule Take 2 capsules (120 mg total) by mouth at bedtime. Patient taking differently: Take 120 mg by mouth every morning. 05/13/20   Cristofano, Worthy Rancher, MD  ferrous sulfate 325 (65 FE) MG tablet Take 650 mg by mouth See admin instructions. Take 650 mg daily for the first 3 days of cycle     [provider]  FLUoxetine (PROZAC) 20 MG capsule Take 20 mg by mouth daily. Take with 40 mg to equal 60 mg daily    [provider]  FLUoxetine (PROZAC) 40 MG capsule Take 40 mg by mouth daily. Take with 20 mg to equal 60 mg daily    [provider]  gabapentin (NEURONTIN) 800 MG tablet Take 800 mg by mouth 3 (three) times daily.    [provider]  gabapentin (NEURONTIN) 800 MG tablet Take 1 tablet by mouth three times a day 02/21/22     lamoTRIgine (LAMICTAL) 200 MG tablet Take 200 mg by mouth 2 (two) times daily.    [provider]  lidocaine (XYLOCAINE) 2 % solution Use as directed 15 mLs in the mouth or throat every 4 (four) hours as needed for mouth pain. 11/03/20   Mesner, Barbara Cower, MD  Melatonin 10 MG CAPS Take 10 mg by mouth See admin instructions. Take 10 mg at bedtime as needed for sleep, may take a second 10 mg dose during the night as needed    [provider]  meloxicam (MOBIC) 15 MG tablet Take 1 tablet (15 mg total) by mouth daily. 03/10/21   Harris, Cammy Copa, PA-C  morphine (MS CONTIN) 15 MG 12 hr tablet Take 15 mg by mouth in the morning, at noon, and at bedtime.    [provider]  mupirocin  ointment (BACTROBAN) 2 % Apply 1 Application topically 2 (two) times daily. 04/27/22   Fisher, Roselyn Bering, PA-C  neomycin-bacitracin-polymyxin (NEOSPORIN) ointment Apply 1 application topically as needed for wound care.    [provider]  omeprazole (PRILOSEC) 40 MG capsule Take 40 mg by mouth daily.    [provider]  Oxycodone HCl 10 MG TABS Take 10 mg by mouth every 4 (four) hours as needed for breakthrough pain. 03/01/21   [provider]  Oxycodone HCl 10 MG TABS Take 1/2-1 tablet by mouth every four hours as needed for pain 02/21/22     Oxycodone HCl 10 MG TABS Take 1/2 - 1 tablet (5 - 10 mg total) by mouth every 4 hours as needed for pain 08/30/22     Oxycodone HCl 10 MG TABS Take 1/2-1 tablet by mouth every  four hours as needed for pain 09/26/22     Oxycodone HCl 10 MG TABS Take 1/2 - 1 tablet (5 - 10 mg) by mouth every 4 hours as needed for pain 10/22/22     sucralfate (CARAFATE) 1 g tablet Take 1 tablet (1 g total) by mouth 4 (four) times daily. 07/10/22 08/09/22  Merwyn Katos, MD  tiZANidine (ZANAFLEX) 2 MG tablet Take 2-4 mg by mouth 4 (four) times daily as needed for muscle spasms. Max 4 tabs    [provider]  tiZANidine (ZANAFLEX) 2 MG tablet Take 1 tablet by mouth four times a day. 02/21/22         Allergies    Codeine, Phenergan [promethazine hcl], Seroquel [quetiapine fumarate], and Sulfa antibiotics    Review of Systems   Review of Systems  Physical Exam Updated Vital Signs BP 124/77   Pulse 78   Temp 98.7 F (37.1 C)   Resp 20   Ht 5\' 1"  (1.549 m)   Wt 90.7 kg   SpO2 94%   BMI 37.79 kg/m  Physical Exam Constitutional:      Appearance: She is well-developed.  HENT:     Head: Normocephalic and atraumatic.  Eyes:     Conjunctiva/sclera: Conjunctivae normal.  Cardiovascular:     Rate and Rhythm: Normal rate.  Pulmonary:     Effort: Pulmonary effort is normal. No respiratory distress.  Abdominal:     General: There is no distension.     Tenderness: There is no abdominal tenderness. There is no guarding.  Musculoskeletal:        General: Normal range of motion.     Cervical back: Normal range of motion.     Comments: Right hip with normal active and passive range of motion.  No pain with hip internal ex rotation.  She is tender along the gluteal region to palpation as well as along the posterior greater troches.  She has good hip abduction strength as well as hip flexion strength.  She is neurovascular intact in bilateral lower extremities.  Skin:    General: Skin is warm.     Findings: No rash.  Neurological:     Mental Status: She is alert and oriented to person, place, and time.  Psychiatric:        Behavior: Behavior normal.        Thought Content:  Thought content normal.     ED Results / Procedures / Treatments   Labs (all labs ordered are listed, but only abnormal results are displayed) Labs Reviewed - No data to display  EKG None  Radiology No results found. Imaging of  the right hip reviewed by me today from 11/16/2022 shows intact hardware lower lumbar spine, right hip with no significant arthropathy with joint space narrowing.  No signs of AVN or collapse.  No fracture.   Procedures Procedures    Medications Ordered in ED Medications - No data to display  ED Course/ Medical Decision Making/ A&P                             Medical Decision Making Risk Prescription drug management.   47 year old female with right hip pain, gluteal pain consistent with piriformis syndrome.  She is focally tender to palpation has pain with sitting on the gluteal region.  She has normal hip range of motion with no significant discomfort.  Recent hip x-rays are negative.  No evidence of trauma or injury.  She denies any sciatica symptoms and has no neurological deficits on exam.  Will treat with 10-day steroid taper and have her continue with tizanidine and oxycodone.  She is given stretching exercises to work on piriformis stretches and will follow-up with orthopedic specialist Tuesday of next week.  She is given a note to remain out of work and she understands signs symptoms return to the ER for. Final Clinical Impression(s) / ED Diagnoses Final diagnoses:  Piriformis syndrome of right side    Rx / DC Orders ED Discharge Orders          Ordered    predniSONE (DELTASONE) 10 MG tablet        11/21/22 1641              Ronnette Juniper 11/21/22 1645    Sharman Cheek, MD 11/21/22 2352

## 2022-11-21 NOTE — ED Triage Notes (Signed)
Pt to ED for lower back pain radiating to right hip starting today, seen for same a couple days ago.

## 2022-11-21 NOTE — ED Provider Triage Note (Signed)
Emergency Medicine Provider Triage Evaluation Note  Angela Massey , a 47 y.o. female  was evaluated in triage.  Pt complains of right buttock pain that began a few days ago. Patient reports that she has been using heating pad and ice. No weakness in her legs. Had back surgery 1.5 years ago at [facility]. Reports that she was diagnoses with piriformis syndrome. Has been stretching but does not feel that it has been helpful.  Review of Systems  Positive: Right buttock pain Negative: Paresthesias/weakness/urinary/fecal incontinence or retention  Physical Exam  BP 124/77   Pulse 78   Temp 98.7 F (37.1 C)   Resp 20   SpO2 94%  Gen:   Awake, no distress   Resp:  Normal effort  MSK:   Moves extremities without difficulty  Other:    Medical Decision Making  Medically screening exam initiated at 3:32 PM.  Appropriate orders placed.  Angela Massey was informed that the remainder of the evaluation will be completed by another provider, this initial triage assessment does not replace that evaluation, and the importance of remaining in the ED until their evaluation is complete.     Angela Hoehn, PA-C 11/21/22 1535

## 2022-11-22 ENCOUNTER — Ambulatory Visit: Payer: Medicare Other | Admitting: Physician Assistant

## 2022-11-27 ENCOUNTER — Other Ambulatory Visit (HOSPITAL_COMMUNITY): Payer: Self-pay

## 2022-11-30 ENCOUNTER — Other Ambulatory Visit (HOSPITAL_COMMUNITY): Payer: Self-pay

## 2022-12-03 ENCOUNTER — Other Ambulatory Visit: Payer: Self-pay | Admitting: Physical Medicine and Rehabilitation

## 2022-12-03 DIAGNOSIS — M5416 Radiculopathy, lumbar region: Secondary | ICD-10-CM

## 2022-12-24 ENCOUNTER — Other Ambulatory Visit (HOSPITAL_COMMUNITY): Payer: Self-pay

## 2022-12-24 MED ORDER — OXYCODONE HCL 10 MG PO TABS
5.0000 mg | ORAL_TABLET | ORAL | 0 refills | Status: DC | PRN
  Filled 2022-12-24 – 2022-12-25 (×2): qty 180, 30d supply, fill #0

## 2022-12-25 ENCOUNTER — Other Ambulatory Visit: Payer: Self-pay

## 2022-12-25 ENCOUNTER — Other Ambulatory Visit (HOSPITAL_COMMUNITY): Payer: Self-pay

## 2023-01-01 ENCOUNTER — Telehealth: Payer: Self-pay | Admitting: Physician Assistant

## 2023-01-02 ENCOUNTER — Encounter: Payer: Self-pay | Admitting: Physician Assistant

## 2023-01-02 ENCOUNTER — Ambulatory Visit
Admission: RE | Admit: 2023-01-02 | Discharge: 2023-01-02 | Disposition: A | Discharge status: Home / Self Care | Payer: No Typology Code available for payment source | Source: Ambulatory Visit | Attending: Physical Medicine and Rehabilitation | Admitting: Physical Medicine and Rehabilitation

## 2023-01-02 ENCOUNTER — Ambulatory Visit (INDEPENDENT_AMBULATORY_CARE_PROVIDER_SITE_OTHER): Payer: Medicare Other | Admitting: Physician Assistant

## 2023-01-02 VITALS — BP 124/87 | HR 71 | Resp 16 | Ht 61.0 in | Wt 205.0 lb

## 2023-01-02 DIAGNOSIS — Z1231 Encounter for screening mammogram for malignant neoplasm of breast: Secondary | ICD-10-CM

## 2023-01-02 DIAGNOSIS — E782 Mixed hyperlipidemia: Secondary | ICD-10-CM | POA: Diagnosis not present

## 2023-01-02 DIAGNOSIS — G6289 Other specified polyneuropathies: Secondary | ICD-10-CM

## 2023-01-02 DIAGNOSIS — F339 Major depressive disorder, recurrent, unspecified: Secondary | ICD-10-CM

## 2023-01-02 DIAGNOSIS — Z1211 Encounter for screening for malignant neoplasm of colon: Secondary | ICD-10-CM

## 2023-01-02 DIAGNOSIS — G629 Polyneuropathy, unspecified: Secondary | ICD-10-CM | POA: Insufficient documentation

## 2023-01-02 DIAGNOSIS — H9193 Unspecified hearing loss, bilateral: Secondary | ICD-10-CM | POA: Insufficient documentation

## 2023-01-02 DIAGNOSIS — M5416 Radiculopathy, lumbar region: Secondary | ICD-10-CM | POA: Diagnosis not present

## 2023-01-02 DIAGNOSIS — Z720 Tobacco use: Secondary | ICD-10-CM

## 2023-01-02 DIAGNOSIS — K219 Gastro-esophageal reflux disease without esophagitis: Secondary | ICD-10-CM | POA: Insufficient documentation

## 2023-01-02 DIAGNOSIS — L700 Acne vulgaris: Secondary | ICD-10-CM | POA: Insufficient documentation

## 2023-01-02 MED ORDER — OMEPRAZOLE 40 MG PO CPDR: 40.0000 mg | DELAYED_RELEASE_CAPSULE | Freq: Every day | ORAL | 3 refills | Status: AC

## 2023-01-02 MED ORDER — DOXYCYCLINE HYCLATE 100 MG PO TABS: 100.0000 mg | ORAL_TABLET | Freq: Every day | ORAL | 1 refills | Status: AC

## 2023-01-02 MED ORDER — GADOPICLENOL 0.5 MMOL/ML IV SOLN
9.0000 mL | Freq: Once | INTRAVENOUS | Status: AC | PRN
  Administered 2023-01-02: 9 mL via INTRAVENOUS

## 2023-01-02 NOTE — Assessment & Plan Note (Signed)
Gastric Bypass, GERD: History of gastric bypass surgery. Persistent GERD since. Manages with omeprazole 20 mg daily.

## 2023-01-02 NOTE — Assessment & Plan Note (Signed)
Chronic Back Pain/peripheral neuropathy: History of lumbar fusion (L4-S1) less than two years ago. Currently on Gabapentin 800mg , transitioning to 600mg . Takes muscle relaxant, oxy. follows with pain management.  Peripheral Neuropathy: Reports of numbness in left foot, extending to right foot, with sensations of sharp pain and itching. -Refer to neurologist for further evaluation.

## 2023-01-02 NOTE — Assessment & Plan Note (Signed)
Hearing Loss: Reports of difficulty hearing in noisy environments and muffled sounds. -Refer to audiologist for formal hearing test.

## 2023-01-02 NOTE — Assessment & Plan Note (Signed)
Encouraged smoking cessation 

## 2023-01-02 NOTE — Assessment & Plan Note (Signed)
Follows with psych Cymbalta 60 mg and wellbutrin 450 mg

## 2023-01-02 NOTE — Progress Notes (Signed)
New patient visit   Patient: Angela Massey   DOB: Nov 10, 1975   47 y.o. Female  MRN: [id number] Visit Date: 01/02/2023  Today's healthcare provider: Alfredia Ferguson, PA-C   Chief Complaint  Patient presents with   Establish Care   Subjective    Angela Massey is a 47 y.o. female who presents today as a new patient to establish care.  HPI   Discussed the use of AI scribe software for clinical note transcription with the patient, who gave verbal consent to proceed.  History of Present Illness   The patient, with a history of PCOS, anxiety, depression, and back pain/lumbar fusion, presents with multiple concerns. They report having menstrual cycles every two weeks for the past few months, which is a significant change from their usual pattern. The patient also mentions experiencing swelling in their legs, particularly on the left side, which has been ongoing for a while. They have tried using compression socks and increasing their water intake, but these measures have not provided significant relief. Reports being very active at work. The patient also reports hearing difficulties, particularly in noisy environments, and struggles to differentiate between sounds. They have tried using hearing aids in the past with limited success.    They are the most concerned about pain, numbness, swelling in the left lower extremity. Reports pain and numbness will also happen in the right lower extremity. Follows with pain management, would like referral to neuro.      She sees GYN, psych, pain management.    Past Medical History:  Diagnosis Date   Anemia    Anxiety    Arthritis    back and ankles   Depression    Fatty liver    Fibromyalgia    GERD (gastroesophageal reflux disease)    Headache    Hypertriglyceridemia    Ovarian abscess    PCOS (polycystic ovarian syndrome)    PONV (postoperative nausea and vomiting)    Pseudotumor    Sciatica    Past Surgical History:  Procedure  Laterality Date   Diagnostic Laparotomy     LAPAROSCOPIC GASTRIC SLEEVE RESECTION  10/31/2019   planter facitis     TUBAL LIGATION     Family Status  Relation Name Status   Mother  (Not Specified)   Father  Deceased   MGM  (Not Specified)   PGF  (Not Specified)   Family History  Problem Relation Age of Onset   Cancer Mother        squamous cell   Asthma Mother    Depression Mother    Anxiety disorder Mother    Cancer Father        squamous cell; nonhodgkin's lymphoma   Alcohol abuse Father    Diabetes Maternal Grandmother    Emphysema Maternal Grandmother    Stroke Paternal Grandfather    Social History   Socioeconomic History   Marital status: Legally Separated    Spouse name: Not on file   Number of children: 6   Years of education: 14   Highest education level: Not on file  Occupational History   Occupation: Event organiser: [employer]  Tobacco Use   Smoking status: Some Days    Packs/day: 0.25    Years: 10.00    Additional pack years: 0.00    Total pack years: 2.50    Types: Cigarettes   Smokeless tobacco: Never   Tobacco comments:    Start since 29 regularly, 4-5 cig  daily, vapes as well   Vaping Use   Vaping Use: Every day   Substances: Nicotine  Substance and Sexual Activity   Alcohol use: Not Currently    Alcohol/week: 0.0 standard drinks of alcohol   Drug use: No   Sexual activity: Yes    Partners: Male    Birth control/protection: Surgical  Other Topics Concern   Not on file  Social History Narrative   Born in [place] and raised there until 10 by mom and step dad. Biological parents divorced when she was 89mo. She then moved to [place] states. At 47yo she moved to [place] by herself for [employer] and then moved to [place] and lived there for 17ys. Pt has one older brother and one older sister. Pt has an associates degree in Surveyor, minerals. 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 Pt has been married 3x. Current  marriage for 10 yrs and has 3 biological kids and 3 adopted kids.    Social Determinants of Health   Financial Resource Strain: Low Risk  (01/02/2023)   Overall Financial Resource Strain (CARDIA)    Difficulty of Paying Living Expenses: Not hard at all  Food Insecurity: No Food Insecurity (01/02/2023)   Hunger Vital Sign    Worried About Running Out of Food in the Last Year: Never true    Ran Out of Food in the Last Year: Never true  Transportation Needs: No Transportation Needs (01/02/2023)   PRAPARE - Administrator, Civil Service (Medical): No    Lack of Transportation (Non-Medical): No  Physical Activity: Sufficiently Active (01/02/2023)   Exercise Vital Sign    Days of Exercise per Week: 5 days    Minutes of Exercise per Session: 40 min  Stress: Stress Concern Present (01/02/2023)   [employer] of Occupational Health - Occupational Stress Questionnaire    Feeling of Stress : To some extent  Social Connections: Socially Isolated (01/02/2023)   Social Connection and Isolation Panel [NHANES]    Frequency of Communication with Friends and Family: Never    Frequency of Social Gatherings with Friends and Family: Never    Attends Religious Services: 1 to 4 times per year    Active Member of [employer] or Organizations: No    Attends Banker Meetings: Never    Marital Status: Divorced   Outpatient Medications Prior to Visit  Medication Sig   acetaminophen (TYLENOL) 650 MG CR tablet Take 1,300 mg by mouth every 8 (eight) hours as needed for pain.   aspirin-acetaminophen-caffeine (EXCEDRIN MIGRAINE) 250-250-65 MG tablet Take 2 tablets by mouth every 6 (six) hours as needed for headache.   buPROPion (WELLBUTRIN XL) 150 MG 24 hr tablet Take 150 mg by mouth every morning.   buPROPion (WELLBUTRIN XL) 300 MG 24 hr tablet Take 450 mg by mouth daily. Takes with 150 mg   DULoxetine (CYMBALTA) 60 MG capsule Take 60 mg by mouth daily.   gabapentin (NEURONTIN) 800 MG tablet Take 1  tablet by mouth three times a day   hydrOXYzine (ATARAX) 25 MG tablet Take 25 mg by mouth as needed.   Melatonin 10 MG CAPS Take 10 mg by mouth See admin instructions. Take 10 mg at bedtime as needed for sleep, may take a second 10 mg dose during the night as needed   Oxycodone HCl 10 MG TABS Take 0.5-1 tablets (5-10 mg total) by mouth every 4 (four) hours as needed for pain   spironolactone (ALDACTONE)  50 MG tablet Take 50 mg by mouth daily.   tiZANidine (ZANAFLEX) 2 MG tablet Take 1 tablet by mouth four times a day.   valACYclovir (VALTREX) 1000 MG tablet Take 1,000 mg by mouth as needed.   [DISCONTINUED] DULoxetine (CYMBALTA) 60 MG capsule Take 2 capsules (120 mg total) by mouth at bedtime. (Patient taking differently: Take 120 mg by mouth every morning.)   [DISCONTINUED] omeprazole (PRILOSEC) 40 MG capsule Take 40 mg by mouth daily.   [DISCONTINUED] acetaZOLAMIDE (DIAMOX) 250 MG tablet Take 1 tablet (250 mg total) by mouth 2 (two) times daily. (Patient not taking: Reported on 01/02/2023)   [DISCONTINUED] ferrous sulfate 325 (65 FE) MG tablet Take 650 mg by mouth See admin instructions. Take 650 mg daily for the first 3 days of cycle (Patient not taking: Reported on 01/02/2023)   [DISCONTINUED] FLUoxetine (PROZAC) 20 MG capsule Take 20 mg by mouth daily. Take with 40 mg to equal 60 mg daily   [DISCONTINUED] FLUoxetine (PROZAC) 40 MG capsule Take 40 mg by mouth daily. Take with 20 mg to equal 60 mg daily   [DISCONTINUED] gabapentin (NEURONTIN) 800 MG tablet Take 800 mg by mouth 3 (three) times daily.   [DISCONTINUED] lamoTRIgine (LAMICTAL) 200 MG tablet Take 200 mg by mouth 2 (two) times daily.   [DISCONTINUED] lidocaine (XYLOCAINE) 2 % solution Use as directed 15 mLs in the mouth or throat every 4 (four) hours as needed for mouth pain. (Patient not taking: Reported on 01/02/2023)   [DISCONTINUED] meloxicam (MOBIC) 15 MG tablet Take 1 tablet (15 mg total) by mouth daily. (Patient not taking: Reported on  01/02/2023)   [DISCONTINUED] morphine (MS CONTIN) 15 MG 12 hr tablet Take 15 mg by mouth in the morning, at noon, and at bedtime.   [DISCONTINUED] mupirocin ointment (BACTROBAN) 2 % Apply 1 Application topically 2 (two) times daily. (Patient not taking: Reported on 01/02/2023)   [DISCONTINUED] neomycin-bacitracin-polymyxin (NEOSPORIN) ointment Apply 1 application topically as needed for wound care. (Patient not taking: Reported on 01/02/2023)   [DISCONTINUED] Oxycodone HCl 10 MG TABS Take 10 mg by mouth every 4 (four) hours as needed for breakthrough pain.   [DISCONTINUED] Oxycodone HCl 10 MG TABS Take 1/2-1 tablet by mouth every four hours as needed for pain   [DISCONTINUED] Oxycodone HCl 10 MG TABS Take 1/2 - 1 tablet (5 - 10 mg total) by mouth every 4 hours as needed for pain   [DISCONTINUED] Oxycodone HCl 10 MG TABS Take 1/2-1 tablet by mouth every four hours as needed for pain   [DISCONTINUED] predniSONE (DELTASONE) 10 MG tablet 10 day taper. 5,5,4,4,3,3,2,2,1,1 (Patient not taking: Reported on 01/02/2023)   [DISCONTINUED] sucralfate (CARAFATE) 1 g tablet Take 1 tablet (1 g total) by mouth 4 (four) times daily.   [DISCONTINUED] tiZANidine (ZANAFLEX) 2 MG tablet Take 2-4 mg by mouth 4 (four) times daily as needed for muscle spasms. Max 4 tabs   No facility-administered medications prior to visit.   Allergies  Allergen Reactions   Codeine Hives   Phenergan [Promethazine Hcl]     convulsions   Seroquel [Quetiapine Fumarate]     Caused severe hypotension   Sulfa Antibiotics Hives     There is no immunization history on file for this patient.  Health Maintenance  Topic Date Due   Medicare Annual Wellness (AWV)  Never done   HIV Screening  Never done   MAMMOGRAM  Never done   Hepatitis C Screening  Never done   DTaP/Tdap/Td (1 - Tdap)  Never done   PAP SMEAR-Modifier  Never done   Colonoscopy  Never done   COVID-19 Vaccine (1) 01/18/2023 (Originally 12/01/1976)   INFLUENZA VACCINE   01/31/2023   HPV VACCINES  Aged Out    Patient Care Team: Alfredia Ferguson, PA-C as PCP - General (Physician Assistant) Verdon Cummins, MD as Referring Physician (Physical Medicine and Rehabilitation) Mikael Spray, NP as Nurse Practitioner (Gerontology) Oletta Darter, MD (Psychiatry) West Bali, MD (Inactive) as Consulting Physician (Gastroenterology)  Review of Systems  Constitutional:  Negative for fatigue and fever.  Respiratory:  Negative for cough and shortness of breath.   Cardiovascular:  Positive for leg swelling. Negative for chest pain.  Gastrointestinal:  Negative for abdominal pain.  Musculoskeletal:  Positive for arthralgias and back pain.  Neurological:  Positive for weakness and numbness. Negative for dizziness and headaches.     Objective    Blood pressure 124/87, pulse 71, resp. rate 16, height 5\' 1"  (1.549 m), weight 205 lb (93 kg), SpO2 98 %.   Physical Exam Constitutional:      General: She is awake.     Appearance: She is well-developed.  HENT:     Head: Normocephalic.  Eyes:     Conjunctiva/sclera: Conjunctivae normal.  Cardiovascular:     Rate and Rhythm: Normal rate and regular rhythm.     Heart sounds: Normal heart sounds.  Pulmonary:     Effort: Pulmonary effort is normal.     Breath sounds: Normal breath sounds.  Musculoskeletal:     Right lower leg: No edema.     Left lower leg: No edema.  Skin:    General: Skin is warm.  Neurological:     Mental Status: She is alert and oriented to person, place, and time.  Psychiatric:        Attention and Perception: Attention normal.        Mood and Affect: Mood normal.        Speech: Speech normal.        Behavior: Behavior is cooperative.     Depression Screen    01/02/2023    3:25 PM  PHQ 2/9 Scores  PHQ - 2 Score 0  PHQ- 9 Score 6   No results found for any visits on 01/02/23.  Assessment & Plan      Problem List Items Addressed This Visit       Digestive   Gastroesophageal  reflux disease    Gastric Bypass, GERD: History of gastric bypass surgery. Persistent GERD since. Manages with omeprazole 20 mg daily.      Relevant Medications   omeprazole (PRILOSEC) 40 MG capsule     Nervous and Auditory   Lumbar radiculopathy - Primary    Chronic Back Pain/peripheral neuropathy: History of lumbar fusion (L4-S1) less than two years ago. Currently on Gabapentin 800mg , transitioning to 600mg . Takes muscle relaxant, oxy. follows with pain management.  Peripheral Neuropathy: Reports of numbness in left foot, extending to right foot, with sensations of sharp pain and itching. -Refer to neurologist for further evaluation.      Relevant Medications   hydrOXYzine (ATARAX) 25 MG tablet   buPROPion (WELLBUTRIN XL) 300 MG 24 hr tablet   DULoxetine (CYMBALTA) 60 MG capsule   Peripheral neuropathy   Relevant Medications   hydrOXYzine (ATARAX) 25 MG tablet   buPROPion (WELLBUTRIN XL) 300 MG 24 hr tablet   DULoxetine (CYMBALTA) 60 MG capsule   Other Relevant Orders   Ambulatory referral to Neurology   Bilateral  hearing loss    Hearing Loss: Reports of difficulty hearing in noisy environments and muffled sounds. -Refer to audiologist for formal hearing test.       Relevant Orders   Ambulatory referral to Audiology     Musculoskeletal and Integument   Acne vulgaris    Currently on Doxycycline 100mg  twice daily. -Reduce Doxycycline to 100mg  daily. Pt has been on daily abx therapy for ~ 1 year. Would prefer transition to topical treatment if possible.      Relevant Medications   doxycycline (VIBRA-TABS) 100 MG tablet     Other   Depression, major    Follows with psych Cymbalta 60 mg and wellbutrin 450 mg      Relevant Medications   hydrOXYzine (ATARAX) 25 MG tablet   buPROPion (WELLBUTRIN XL) 300 MG 24 hr tablet   DULoxetine (CYMBALTA) 60 MG capsule   Tobacco use    Encouraged smoking cessation      Other Visit Diagnoses     Moderate mixed hyperlipidemia  not requiring statin therapy       Relevant Medications   spironolactone (ALDACTONE) 50 MG tablet   Other Relevant Orders   Lipid Profile   Comprehensive Metabolic Panel (CMET)   Breast cancer screening by mammogram       Relevant Orders   MM 3D SCREENING MAMMOGRAM BILATERAL BREAST   Colon cancer screening       Relevant Orders   Ambulatory referral to Gastroenterology           Return in about 6 months (around 07/05/2023), or if symptoms worsen or fail to improve, for chronic conditions.     I, Alfredia Ferguson, PA-C have reviewed all documentation for this visit. The documentation on  01/02/23   for the exam, diagnosis, procedures, and orders are all accurate and complete.  Alfredia Ferguson, PA-C [facility] [address] [place], [place], [address] Office: [phone] Fax: [phone]   [facility]

## 2023-01-02 NOTE — Assessment & Plan Note (Signed)
Currently on Doxycycline 100mg  twice daily. -Reduce Doxycycline to 100mg  daily. Pt has been on daily abx therapy for ~ 1 year. Would prefer transition to topical treatment if possible.

## 2023-01-14 ENCOUNTER — Encounter: Payer: Self-pay | Admitting: *Deleted

## 2023-01-17 NOTE — Progress Notes (Unsigned)
Angela Ferguson, PA-C   No chief complaint on file.   HPI:      Angela Massey is a 47 y.o. [phone] whose LMP was No LMP recorded. Patient is premenopausal., presents today for NP eval of AUB, referred by PCP.  Hx of PCOS, slight IDA on 3/23 CBC, improved on 1/24 CBC.  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   GAD (generalized anxiety disorder) 10/11/2015   Panic disorder without agoraphobia 10/11/2015   Insomnia 10/11/2015   Hyperprolactinemia (HCC) 05/26/2013   Depression, major 05/26/2013   Pituitary adenoma (HCC) 05/06/2013    Past Surgical History:  Procedure Laterality Date   Diagnostic Laparotomy     LAPAROSCOPIC GASTRIC SLEEVE RESECTION  10/31/2019   planter facitis     TUBAL LIGATION      Family History  Problem Relation Age of Onset   Cancer Mother        squamous cell   Asthma Mother    Depression Mother    Anxiety disorder Mother    Cancer Father        squamous cell; nonhodgkin's lymphoma   Alcohol abuse Father    Diabetes Maternal Grandmother    Emphysema Maternal Grandmother    Stroke Paternal Grandfather     Social History   Socioeconomic History   Marital status: Legally Separated    Spouse name: Not on file   Number of children: 6   Years of education: 14   Highest education level: Not on file  Occupational History   Occupation: Event organiser: [employer]  Tobacco Use   Smoking status: Some Days    Current packs/day: 0.25    Average packs/day: 0.3 packs/day for 10.0 years (2.5 ttl pk-yrs)    Types: Cigarettes   Smokeless tobacco: Never   Tobacco comments:     Start since 29 regularly, 4-5 cig daily, vapes as well   Vaping Use   Vaping status: Every Day   Substances: Nicotine  Substance and Sexual Activity   Alcohol use: Not Currently    Alcohol/week: 0.0 standard drinks of alcohol   Drug use: No   Sexual activity: Yes    Partners: Male    Birth control/protection: Surgical  Other Topics Concern   Not on file  Social History Narrative   Born in [place] and raised there until 10 by mom and step dad. Biological parents divorced when she was 29mo. She then moved to [place]. At 47yo she moved to [place] by herself for [employer] and then moved to [place] and lived there for 17ys. Pt has one older brother and one older sister. Pt has an associates degree in Surveyor, minerals. 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 Pt has been married 3x. Current marriage for 10 yrs and has 3 biological kids and 3 adopted kids.    Social Determinants of Health   Financial Resource Strain: Low Risk  (01/02/2023)   Overall Financial Resource Strain (CARDIA)    Difficulty of Paying Living Expenses: Not hard at all  Food Insecurity: No Food Insecurity (01/02/2023)   Hunger Vital Sign  Worried About Programme researcher, broadcasting/film/video in the Last Year: Never true    Ran Out of Food in the Last Year: Never true  Transportation Needs: No Transportation Needs (01/02/2023)   PRAPARE - Administrator, Civil Service (Medical): No    Lack of Transportation (Non-Medical): No  Physical Activity: Sufficiently Active (01/02/2023)   Exercise Vital Sign    Days of Exercise per Week: 5 days    Minutes of Exercise per Session: 40 min  Stress: Stress Concern Present (01/02/2023)   [employer] of Occupational Health - Occupational Stress Questionnaire    Feeling of Stress : To some extent  Social Connections: Socially Isolated (01/02/2023)   Social Connection and Isolation Panel [NHANES]    Frequency of Communication with Friends and Family: Never     Frequency of Social Gatherings with Friends and Family: Never    Attends Religious Services: 1 to 4 times per year    Active Member of [employer] or Organizations: No    Attends Banker Meetings: Never    Marital Status: Divorced  Catering manager Violence: Not At Risk (01/02/2023)   Humiliation, Afraid, Rape, and Kick questionnaire    Fear of Current or Ex-Partner: No    Emotionally Abused: No    Physically Abused: No    Sexually Abused: No    Outpatient Medications Prior to Visit  Medication Sig Dispense Refill   acetaminophen (TYLENOL) 650 MG CR tablet Take 1,300 mg by mouth every 8 (eight) hours as needed for pain.     aspirin-acetaminophen-caffeine (EXCEDRIN MIGRAINE) 250-250-65 MG tablet Take 2 tablets by mouth every 6 (six) hours as needed for headache.     buPROPion (WELLBUTRIN XL) 150 MG 24 hr tablet Take 150 mg by mouth every morning.     buPROPion (WELLBUTRIN XL) 300 MG 24 hr tablet Take 450 mg by mouth daily. Takes with 150 mg     doxycycline (VIBRA-TABS) 100 MG tablet Take 1 tablet (100 mg total) by mouth daily. 90 tablet 1   DULoxetine (CYMBALTA) 60 MG capsule Take 60 mg by mouth daily.     gabapentin (NEURONTIN) 800 MG tablet Take 1 tablet by mouth three times a day 270 tablet 0   hydrOXYzine (ATARAX) 25 MG tablet Take 25 mg by mouth as needed.     Melatonin 10 MG CAPS Take 10 mg by mouth See admin instructions. Take 10 mg at bedtime as needed for sleep, may take a second 10 mg dose during the night as needed     omeprazole (PRILOSEC) 40 MG capsule Take 1 capsule (40 mg total) by mouth daily. 90 capsule 3   Oxycodone HCl 10 MG TABS Take 0.5-1 tablets (5-10 mg total) by mouth every 4 (four) hours as needed for pain 180 tablet 0   spironolactone (ALDACTONE) 50 MG tablet Take 50 mg by mouth daily.     tiZANidine (ZANAFLEX) 2 MG tablet Take 1 tablet by mouth four times a day. 120 tablet 2   valACYclovir (VALTREX) 1000 MG tablet Take 1,000 mg by mouth as needed.     No  facility-administered medications prior to visit.      ROS:  Review of Systems BREAST: No symptoms   OBJECTIVE:   Vitals:  There were no vitals taken for this visit.  Physical Exam  Results: No results found for this or any previous visit (from the past 24 hour(s)).   Assessment/Plan: No diagnosis found.    No orders of the defined  types were placed in this encounter.     No follow-ups on file.  Bain Whichard B. Corwyn Vora, PA-C 01/17/2023 4:01 PM

## 2023-01-21 ENCOUNTER — Ambulatory Visit (INDEPENDENT_AMBULATORY_CARE_PROVIDER_SITE_OTHER): Payer: No Typology Code available for payment source | Admitting: Obstetrics and Gynecology

## 2023-01-21 ENCOUNTER — Other Ambulatory Visit (HOSPITAL_COMMUNITY): Payer: Self-pay

## 2023-01-21 ENCOUNTER — Encounter: Payer: Self-pay | Admitting: Obstetrics and Gynecology

## 2023-01-21 VITALS — BP 128/80 | Ht 61.0 in | Wt 206.0 lb

## 2023-01-21 DIAGNOSIS — Z30011 Encounter for initial prescription of contraceptive pills: Secondary | ICD-10-CM

## 2023-01-21 DIAGNOSIS — N951 Menopausal and female climacteric states: Secondary | ICD-10-CM

## 2023-01-21 DIAGNOSIS — Z1329 Encounter for screening for other suspected endocrine disorder: Secondary | ICD-10-CM

## 2023-01-21 DIAGNOSIS — N939 Abnormal uterine and vaginal bleeding, unspecified: Secondary | ICD-10-CM | POA: Diagnosis not present

## 2023-01-21 DIAGNOSIS — F3281 Premenstrual dysphoric disorder: Secondary | ICD-10-CM | POA: Diagnosis not present

## 2023-01-21 MED ORDER — MICROGESTIN 24 FE 1-20 MG-MCG PO TABS: 1.0000 | ORAL_TABLET | Freq: Every day | ORAL | 0 refills | Status: DC

## 2023-01-21 MED ORDER — OXYCODONE HCL 10 MG PO TABS
ORAL_TABLET | ORAL | 0 refills | Status: DC | PRN
  Filled 2023-01-23: qty 180, 30d supply, fill #0

## 2023-01-21 NOTE — Patient Instructions (Signed)
I value your feedback and you entrusting us with your care. If you get a [facility] patient survey, I would appreciate you taking the time to let us know about your experience today. Thank you! ? ? ?

## 2023-01-22 ENCOUNTER — Telehealth: Payer: Self-pay | Admitting: Obstetrics and Gynecology

## 2023-01-22 LAB — TSH+FREE T4
Free T4: 0.9 ng/dL (ref 0.82–1.77)
TSH: 1.8 u[IU]/mL (ref 0.450–4.500)

## 2023-01-22 MED ORDER — NORETHINDRONE ACETATE 5 MG PO TABS: 5.0000 mg | ORAL_TABLET | Freq: Every day | ORAL | 0 refills | Status: DC

## 2023-01-22 NOTE — Addendum Note (Signed)
Addended by: Althea Grimmer B on: 01/22/2023 04:55 PM   Modules accepted: Orders

## 2023-01-22 NOTE — Telephone Encounter (Signed)
Pt returning ABC's call about medication she wanted to talk to me about before I p/u.  [phone]

## 2023-01-22 NOTE — Telephone Encounter (Signed)
Done. Spoke with pt

## 2023-01-22 NOTE — Telephone Encounter (Signed)
LM for pt to call back to clarify tob use for OCP Rx.

## 2023-01-22 NOTE — Telephone Encounter (Signed)
LMTRC

## 2023-01-23 ENCOUNTER — Other Ambulatory Visit (HOSPITAL_COMMUNITY): Payer: Self-pay

## 2023-02-12 ENCOUNTER — Ambulatory Visit (INDEPENDENT_AMBULATORY_CARE_PROVIDER_SITE_OTHER): Payer: Medicare Other | Admitting: Physician Assistant

## 2023-02-12 ENCOUNTER — Encounter: Payer: Self-pay | Admitting: Physician Assistant

## 2023-02-12 VITALS — BP 128/86 | HR 74 | Temp 98.4°F | Wt 198.4 lb

## 2023-02-12 DIAGNOSIS — R110 Nausea: Secondary | ICD-10-CM

## 2023-02-12 DIAGNOSIS — K219 Gastro-esophageal reflux disease without esophagitis: Secondary | ICD-10-CM

## 2023-02-12 MED ORDER — ONDANSETRON HCL 4 MG PO TABS: 4.0000 mg | ORAL_TABLET | Freq: Three times a day (TID) | ORAL | 0 refills | Status: DC | PRN

## 2023-02-12 NOTE — Progress Notes (Unsigned)
Established patient visit  Patient: Angela Massey   DOB: August 15, 1975   47 y.o. Female  MRN: [id number] Visit Date: 02/12/2023  Today's healthcare provider: Debera Lat, PA-C   Chief Complaint  Patient presents with   Medical Management of Chronic Issues    Patient started feeling ill Tuesday morning, symptoms were real bad nausea, vomiting, can't keep anything down    Subjective     Discussed the use of AI scribe software for clinical note transcription with the patient, who gave verbal consent to proceed.  History of Present Illness   The patient, with a history of gastric bypass surgery and gastroesophageal reflux disease (GERD), presents with new onset nausea and vomiting. She describes the vomitus as yellow and acidy, and denies any recent changes in diet or exposure to others with similar symptoms. She also reports a decrease in food and fluid intake due to heat intolerance, and suspects that she may be dehydrated. She has been experiencing weakness, shakiness, and chills, and has had difficulty tolerating even sips of water. She also reports new onset shortness of breath since the start of her gastrointestinal symptoms. She denies any changes in bowel movements or urination. She has a history of lower back reconstruction and is limited to lifting no more than 10 pounds. 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           01/02/2023    3:25 PM  Depression screen PHQ 2/9  Decreased Interest 0  Down, Depressed, Hopeless 0  PHQ - 2 Score 0  Altered sleeping 3  Tired, decreased energy 0  Change in appetite 3  Feeling bad or failure about yourself  0  Trouble concentrating 0  Moving slowly or fidgety/restless 0  Suicidal thoughts 0  PHQ-9 Score 6       No data to display          Medications: Outpatient Medications Prior to Visit  Medication Sig   acetaminophen (TYLENOL) 650 MG CR tablet Take 1,300 mg by mouth every 8 (eight) hours as needed for pain.    aspirin-acetaminophen-caffeine (EXCEDRIN MIGRAINE) 250-250-65 MG tablet Take 2 tablets by mouth every 6 (six) hours as needed for headache.   buPROPion (WELLBUTRIN XL) 150 MG 24 hr tablet Take 150 mg by mouth every morning.   buPROPion (WELLBUTRIN XL) 300 MG 24 hr tablet Take 450 mg by mouth daily. Takes with 150 mg   doxycycline (VIBRA-TABS) 100 MG tablet Take 1 tablet (100 mg total) by mouth daily.   DULoxetine (CYMBALTA) 60 MG capsule Take 60 mg by mouth daily.   gabapentin (NEURONTIN) 800 MG tablet Take 1 tablet by mouth three times a day (Patient taking differently: Taking 600 mg three times daily.)   hydrOXYzine (ATARAX) 25 MG tablet Take 25 mg by mouth as needed.   Melatonin 10 MG CAPS Take 10 mg by mouth See admin instructions. Take 10 mg at bedtime as needed for sleep, may take a second 10 mg dose during the night as needed   norethindrone (AYGESTIN) 5 MG tablet Take 1 tablet (5 mg total) by mouth daily.   omeprazole (PRILOSEC) 40 MG capsule Take 1 capsule (40 mg total) by mouth daily.   Oxycodone HCl 10 MG TABS Take 1/2 - 1 tablet by mouth every 4 hours as needed for pain.   spironolactone (ALDACTONE) 50 MG tablet Take 50 mg by mouth daily.   tiZANidine (ZANAFLEX) 2 MG tablet Take 1 tablet by mouth four times a day.  valACYclovir (VALTREX) 1000 MG tablet Take 1,000 mg by mouth as needed.   No facility-administered medications prior to visit.    Review of Systems  All other systems reviewed and are negative.  Except see HPI   {Insert previous labs (optional):23779} {See past labs  Heme  Chem  Endocrine  Serology  Results Review (optional):1}   Objective    BP 128/86 (BP Location: Left Arm, Patient Position: Sitting, Cuff Size: Normal)   Pulse 74   Temp 98.4 F (36.9 C) (Oral)   Wt 198 lb 6.4 oz (90 kg)   LMP 01/20/2023 (Exact Date)   SpO2 100%   BMI 37.49 kg/m  {Insert last BP/Wt (optional):23777}{See vitals history (optional):1}   Physical Exam Constitutional:       General: She is not in acute distress.    Appearance: Normal appearance.  HENT:     Head: Normocephalic.  Eyes:     Extraocular Movements: Extraocular movements intact.     Pupils: Pupils are equal, round, and reactive to light.  Pulmonary:     Effort: Pulmonary effort is normal. No respiratory distress.  Abdominal:     General: Abdomen is flat. Bowel sounds are normal. There is no distension.     Palpations: Abdomen is soft. There is no mass.     Tenderness: There is abdominal tenderness (lower abdomen). There is no right CVA tenderness, left CVA tenderness, guarding or rebound.     Hernia: No hernia is present.  Neurological:     Mental Status: She is alert and oriented to person, place, and time. Mental status is at baseline.  Psychiatric:        Behavior: Behavior normal.        Thought Content: Thought content normal.        Judgment: Judgment normal.      No results found for any visits on 02/12/23.  Assessment & Plan        Acute Gastroenteritis New onset of nausea, vomiting, and abdominal pain. No known exposure to sick contacts. No recent changes in diet or medication. History of gastric bypass surgery. No fever or changes in bowel movements or urination. -Prescribe Zofran for nausea and vomiting. -Advise to drink Pedialyte for rehydration.  Gastroesophageal Reflux Disease Chronic condition managed with medication. No exacerbation of symptoms. -Continue current medication regimen.  Work Restrictions Patient has physical job and is concerned about missing work due to illness. Already has restrictions in place due to lower back reconstruction. -Advise patient to return to work when feeling better. If not feeling well, advise to return home.  Follow-up Monitor symptoms and return to clinic if condition worsens or does not improve.     No follow-ups on file.     The patient was advised to call back or seek an in-person evaluation if the symptoms worsen or if  the condition fails to improve as anticipated.  I discussed the assessment and treatment plan with the patient. The patient was provided an opportunity to ask questions and all were answered. The patient agreed with the plan and demonstrated an understanding of the instructions.  I, Debera Lat, PA-C have reviewed all documentation for this visit. The documentation on  02/12/23  for the exam, diagnosis, procedures, and orders are all accurate and complete.  Debera Lat, [facility], MMS [facility] [phone] (phone) [phone] (fax)  [facility]

## 2023-02-18 ENCOUNTER — Other Ambulatory Visit (HOSPITAL_COMMUNITY): Payer: Self-pay

## 2023-02-18 ENCOUNTER — Encounter: Payer: Self-pay | Admitting: *Deleted

## 2023-02-18 MED ORDER — OXYCODONE HCL 10 MG PO TABS
5.0000 mg | ORAL_TABLET | ORAL | 0 refills | Status: DC | PRN
  Filled 2023-03-22: qty 180, 30d supply, fill #0

## 2023-02-18 MED ORDER — OXYCODONE HCL 10 MG PO TABS
5.0000 mg | ORAL_TABLET | ORAL | 0 refills | Status: DC | PRN
  Filled 2023-02-19: qty 180, 30d supply, fill #0

## 2023-02-19 ENCOUNTER — Other Ambulatory Visit (HOSPITAL_COMMUNITY): Payer: Self-pay

## 2023-02-20 ENCOUNTER — Other Ambulatory Visit: Payer: Self-pay

## 2023-02-20 ENCOUNTER — Other Ambulatory Visit (HOSPITAL_COMMUNITY): Payer: Self-pay

## 2023-03-07 ENCOUNTER — Ambulatory Visit (INDEPENDENT_AMBULATORY_CARE_PROVIDER_SITE_OTHER): Payer: Medicare Other | Admitting: Physician Assistant

## 2023-03-07 ENCOUNTER — Encounter: Payer: Self-pay | Admitting: Physician Assistant

## 2023-03-07 VITALS — BP 120/76 | HR 75 | Ht 61.0 in | Wt 194.7 lb

## 2023-03-07 DIAGNOSIS — R5383 Other fatigue: Secondary | ICD-10-CM

## 2023-03-07 DIAGNOSIS — F419 Anxiety disorder, unspecified: Secondary | ICD-10-CM

## 2023-03-07 DIAGNOSIS — R110 Nausea: Secondary | ICD-10-CM

## 2023-03-07 DIAGNOSIS — M542 Cervicalgia: Secondary | ICD-10-CM | POA: Diagnosis not present

## 2023-03-07 DIAGNOSIS — K5909 Other constipation: Secondary | ICD-10-CM

## 2023-03-07 MED ORDER — ONDANSETRON HCL 4 MG PO TABS: 4.0000 mg | ORAL_TABLET | Freq: Three times a day (TID) | ORAL | 0 refills | Status: DC | PRN

## 2023-03-07 NOTE — Progress Notes (Unsigned)
Established patient visit  Patient: Angela Massey   DOB: 1975-09-17   47 y.o. Female  MRN: [id number] Visit Date: 03/07/2023  Today's healthcare provider: Debera Lat, PA-C   Chief Complaint  Patient presents with   Medical Management of Chronic Issues    Has increases nausea, weakness, dizziness, and fatigue, has been a problem for awhile but has become worse in the last 2 weeks , working from 7 am-3:30 pm. Works at [employer] and [employer]. body stiffness that she would like to see a neurologist for, stiffness in neck   Subjective     Discussed the use of AI scribe software for clinical note transcription with the patient, who gave verbal consent to proceed.  History of Present Illness   The patient, with a past medical history of gastric bypass surgery, presents with increased nausea, weakness, dizziness, and fatigue that have worsened over the past two weeks. The patient reports that these symptoms have been a problem for a while but have become more severe recently. The patient also mentions experiencing body stiffness, particularly in the neck, which has been ongoing for three weeks. The patient describes the neck pain as a tight knot and has tried various remedies such as stretching, heating pads, and ice packs with no relief.  The patient also reports issues with constipation, stating that bowel movements have been infrequent and sometimes require the use of a laxative. The patient mentions that this has been an issue since her gastric bypass surgery three years ago. The patient also notes that certain foods, particularly yogurt, seem to exacerbate the nausea.  The patient is currently working full-time and is a single mother with nine children, three of whom are still living at home. The patient expresses that the fatigue and other symptoms have been affecting her ability to work and manage her busy life.           03/07/2023    4:26 PM 01/02/2023    3:25 PM   Depression screen PHQ 2/9  Decreased Interest 2 0  Down, Depressed, Hopeless 2 0  PHQ - 2 Score 4 0  Altered sleeping 3 3  Tired, decreased energy 3 0  Change in appetite 3 3  Feeling bad or failure about yourself  0 0  Trouble concentrating 1 0  Moving slowly or fidgety/restless 0 0  Suicidal thoughts 0 0  PHQ-9 Score 14 6      03/07/2023    4:26 PM  GAD 7 : Generalized Anxiety Score  Nervous, Anxious, on Edge 3  Control/stop worrying 3  Worry too much - different things 3  Trouble relaxing 3  Restless 1  Easily annoyed or irritable 3  Afraid - awful might happen 3  Total GAD 7 Score 19    Medications: Outpatient Medications Prior to Visit  Medication Sig   acetaminophen (TYLENOL) 650 MG CR tablet Take 1,300 mg by mouth every 8 (eight) hours as needed for pain.   aspirin-acetaminophen-caffeine (EXCEDRIN MIGRAINE) 250-250-65 MG tablet Take 2 tablets by mouth every 6 (six) hours as needed for headache.   buPROPion (WELLBUTRIN XL) 150 MG 24 hr tablet Take 150 mg by mouth every morning.   buPROPion (WELLBUTRIN XL) 300 MG 24 hr tablet Take 450 mg by mouth daily. Takes with 150 mg   doxycycline (VIBRA-TABS) 100 MG tablet Take 1 tablet (100 mg total) by mouth daily.   DULoxetine (CYMBALTA) 60 MG capsule Take 60 mg by mouth daily.   gabapentin (  NEURONTIN) 800 MG tablet Take 1 tablet by mouth three times a day (Patient taking differently: Taking 600 mg three times daily.)   hydrOXYzine (ATARAX) 25 MG tablet Take 25 mg by mouth as needed.   Melatonin 10 MG CAPS Take 10 mg by mouth See admin instructions. Take 10 mg at bedtime as needed for sleep, may take a second 10 mg dose during the night as needed   norethindrone (AYGESTIN) 5 MG tablet Take 1 tablet (5 mg total) by mouth daily.   omeprazole (PRILOSEC) 40 MG capsule Take 1 capsule (40 mg total) by mouth daily.   Oxycodone HCl 10 MG TABS Take 1/2 - 1 tablet by mouth every 4 hours as needed for pain.   Oxycodone HCl 10 MG TABS Take  0.5-1 tablets (5-10 mg total) by mouth every 4 (four) hours as needed for pain.   [START ON 03/19/2023] Oxycodone HCl 10 MG TABS Take 0.5-1 tablets (5-10 mg total) by mouth every 4 (four) hours as needed for pain.   spironolactone (ALDACTONE) 50 MG tablet Take 50 mg by mouth daily.   tiZANidine (ZANAFLEX) 2 MG tablet Take 1 tablet by mouth four times a day.   valACYclovir (VALTREX) 1000 MG tablet Take 1,000 mg by mouth as needed.   [DISCONTINUED] ondansetron (ZOFRAN) 4 MG tablet Take 1 tablet (4 mg total) by mouth every 8 (eight) hours as needed for nausea or vomiting.   No facility-administered medications prior to visit.    Review of Systems  All other systems reviewed and are negative.  Except see HPI   {Insert previous labs (optional):23779} {See past labs  Heme  Chem  Endocrine  Serology  Results Review (optional):1}   Objective    BP 120/76 (BP Location: Left Arm, Patient Position: Sitting, Cuff Size: Normal)   Pulse 75   Ht 5\' 1"  (1.549 m)   Wt 194 lb 11.2 oz (88.3 kg)   SpO2 99%   BMI 36.79 kg/m  {Insert last BP/Wt (optional):23777}{See vitals history (optional):1}   Physical Exam Vitals reviewed.  Constitutional:      General: She is not in acute distress.    Appearance: Normal appearance. She is well-developed. She is not diaphoretic.  HENT:     Head: Normocephalic and atraumatic.  Eyes:     General: No scleral icterus.    Conjunctiva/sclera: Conjunctivae normal.  Neck:     Thyroid: No thyromegaly.  Cardiovascular:     Rate and Rhythm: Normal rate and regular rhythm.     Pulses: Normal pulses.     Heart sounds: Normal heart sounds. No murmur heard. Pulmonary:     Effort: Pulmonary effort is normal. No respiratory distress.     Breath sounds: Normal breath sounds. No wheezing, rhonchi or rales.  Musculoskeletal:     Cervical back: Neck supple.     Right lower leg: No edema.     Left lower leg: No edema.  Lymphadenopathy:     Cervical: No cervical  adenopathy.  Skin:    General: Skin is warm and dry.     Findings: No rash.  Neurological:     Mental Status: She is alert and oriented to person, place, and time. Mental status is at baseline.  Psychiatric:        Mood and Affect: Mood normal.        Behavior: Behavior normal.      No results found for any visits on 03/07/23.  Assessment & Plan     Nausea/constipation Post-Gastric  Bypass Complications Chronic problems Persistent nausea, vomiting, and constipation for the past 3 years, worsening over the past month. Recent intolerance to yogurt. Possible correlation with anxiety. Currently on oxycodone for chronic pain -Continue Zofran as needed for nausea. Advised eating small doses, avoid sugary drinks and snacks -Refer to Gastroenterology for further evaluation. -Consider non-dairy substitutes for yogurt and increase intake of prune juice and apricot for constipation if tolerated. Last labs were done on 07/10/22? Did not do labs on 01/02/23 Needs updated labs  Neck pain Muscle Stiffness Complaints of body stiffness, particularly in the neck, with a palpable knot causing discomfort and difficulty sleeping. History of arthritis in the neck. In the setting of lumbar radiculopathy with chronic back pain/peripheral neuropathy. Hx of lumbar fusion 2 years ago. -Refer to Neurology for further evaluation. Advised to continue with gabapentin 600mg , muscle relaxant, oxy, heat/ice, massage. Follows with clinic Pt has been on oxycodone for pain   Anxiety Chronic Scoring high on GAD7 Reports of increased anxiety attacks, currently managed by [facility]. Continue with [facility]. -Continue current mental health management with Cymbalta 60mg  and wellbutrin 450mg  Atarax 25 mg  General Health Maintenance -Check urine pregnancy test due to ongoing nausea and vomiting.      No follow-ups on file. Update her medication list at her next appt    The patient was advised to call  back or seek an in-person evaluation if the symptoms worsen or if the condition fails to improve as anticipated.  I discussed the assessment and treatment plan with the patient. The patient was provided an opportunity to ask questions and all were answered. The patient agreed with the plan and demonstrated an understanding of the instructions.  I, Debera Lat, PA-C have reviewed all documentation for this visit. The documentation on  03/07/23  for the exam, diagnosis, procedures, and orders are all accurate and complete.  Debera Lat, [facility], MMS [facility] [phone] (phone) [phone] (fax)  [facility]

## 2023-03-08 ENCOUNTER — Telehealth: Payer: Self-pay | Admitting: Physician Assistant

## 2023-03-09 LAB — POCT URINE PREGNANCY: Preg Test, Ur: NEGATIVE

## 2023-03-22 ENCOUNTER — Other Ambulatory Visit (HOSPITAL_COMMUNITY): Payer: Self-pay

## 2023-03-26 ENCOUNTER — Ambulatory Visit: Payer: Medicare Other | Admitting: Obstetrics and Gynecology

## 2023-03-26 NOTE — Progress Notes (Deleted)
Angela Ferguson, PA-C   No chief complaint on file.   HPI:      [name] is a 47 y.o. [phone] whose LMP was No LMP recorded. Patient is premenopausal., presents today for NP eval of AUB, referred by PCP. Menses have been irregular for over a year. Were mostly about Q3-5 wks, then QOwk for a few months recently, then Q5 wks this past cycle. Bleeding lasts 4-7 days, mod flow, with 1/2 dollar + sized clots, and mod to severe dysmen. Pt also with significant fatigue and PMDD sx before menses. Is having vasomotor sx. Started period yesterday and having heavy flow today with dysmen. Hx of PCOS, slight IDA on 3/23 CBC, improved on 1/24 CBC. No thyroid labs done, no GYN u/s. No recent pap. No hx of HTN, DVTs, migraines with aura but does use tobacco. Did OCPs and Mirena in distant past.  Pt is sexually active, s/p TL. Has some dryness improved with lubricants.    Patient Active Problem List   Diagnosis Date Noted  . Peripheral neuropathy 01/02/2023  . Gastroesophageal reflux disease 01/02/2023  . Acne vulgaris 01/02/2023  . Bilateral hearing loss 01/02/2023  . Tobacco use 01/02/2023  . Lumbar radiculopathy 09/20/2021  . Degenerative spondylolisthesis 03/24/2021  . Cervical lymphadenopathy 09/06/2020  . Pharyngeal mass 09/06/2020  . GAD (generalized anxiety disorder) 10/11/2015  . Panic disorder without agoraphobia 10/11/2015  . Insomnia 10/11/2015  . Hyperprolactinemia (HCC) 05/26/2013  . Depression, major 05/26/2013  . Pituitary adenoma (HCC) 05/06/2013    Past Surgical History:  Procedure Laterality Date  . Diagnostic Laparotomy    . LAPAROSCOPIC GASTRIC SLEEVE RESECTION  10/31/2019  . planter facitis    . TUBAL LIGATION      Family History  Problem Relation Age of Onset  . Cancer Mother        squamous cell  . Asthma Mother   . Depression Mother   . Anxiety disorder Mother   . Cancer Father        squamous cell; nonhodgkin's lymphoma  . Alcohol abuse Father    . Diabetes Maternal Grandmother   . Emphysema Maternal Grandmother   . Stroke Paternal Grandfather     Social History   Socioeconomic History  . Marital status: Divorced    Spouse name: Not on file  . Number of children: 6  . Years of education: 27  . Highest education level: Associate degree: academic program  Occupational History  . Occupation: Event organiser: [employer]  Tobacco Use  . Smoking status: Some Days    Current packs/day: 0.25    Average packs/day: 0.3 packs/day for 10.0 years (2.5 ttl pk-yrs)    Types: Cigarettes  . Smokeless tobacco: Never  . Tobacco comments:    Start since 29 regularly, 4-5 cig daily, vapes as well   Vaping Use  . Vaping status: Every Day  . Substances: Nicotine  Substance and Sexual Activity  . Alcohol use: Not Currently    Alcohol/week: 0.0 standard drinks of alcohol  . Drug use: No  . Sexual activity: Yes    Partners: Male    Birth control/protection: Surgical    Comment: Tubal Ligation  Other Topics Concern  . Not on file  Social History Narrative   Born in [place] and raised there until 10 by mom and step dad. Biological parents divorced when she was 41mo. She then moved to [place] states. At 47yo she moved to [facility] by herself for  [employer] and then moved to [place] and lived there for 17ys. Pt has one older brother and one older sister. Pt has an associates degree in Surveyor, minerals. 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 Pt has been married 3x. Current marriage for 10 yrs and has 3 biological kids and 3 adopted kids.    Social Determinants of Health   Financial Resource Strain: Low Risk  (02/12/2023)   Overall Financial Resource Strain (CARDIA)   . Difficulty of Paying Living Expenses: Not very hard  Food Insecurity: No Food Insecurity (02/12/2023)   Hunger Vital Sign   . Worried About Programme researcher, broadcasting/film/video in the Last Year: Never true   . Ran Out of Food in the Last Year: Never true   Transportation Needs: No Transportation Needs (02/12/2023)   PRAPARE - Transportation   . Lack of Transportation (Medical): No   . Lack of Transportation (Non-Medical): No  Physical Activity: Insufficiently Active (02/12/2023)   Exercise Vital Sign   . Days of Exercise per Week: 2 days   . Minutes of Exercise per Session: 30 min  Stress: Stress Concern Present (02/12/2023)   [employer] of Occupational Health - Occupational Stress Questionnaire   . Feeling of Stress : To some extent  Social Connections: Moderately Isolated (02/12/2023)   Social Connection and Isolation Panel [NHANES]   . Frequency of Communication with Friends and Family: Twice a week   . Frequency of Social Gatherings with Friends and Family: Once a week   . Attends Religious Services: More than 4 times per year   . Active Member of Clubs or Organizations: No   . Attends Banker Meetings: Never   . Marital Status: Divorced  Catering manager Violence: Not At Risk (01/02/2023)   Humiliation, Afraid, Rape, and Kick questionnaire   . Fear of Current or Ex-Partner: No   . Emotionally Abused: No   . Physically Abused: No   . Sexually Abused: No    Outpatient Medications Prior to Visit  Medication Sig Dispense Refill  . acetaminophen (TYLENOL) 650 MG CR tablet Take 1,300 mg by mouth every 8 (eight) hours as needed for pain.    Marland Kitchen aspirin-acetaminophen-caffeine (EXCEDRIN MIGRAINE) 250-250-65 MG tablet Take 2 tablets by mouth every 6 (six) hours as needed for headache.    Marland Kitchen buPROPion (WELLBUTRIN XL) 150 MG 24 hr tablet Take 150 mg by mouth every morning.    Marland Kitchen buPROPion (WELLBUTRIN XL) 300 MG 24 hr tablet Take 450 mg by mouth daily. Takes with 150 mg    . doxycycline (VIBRA-TABS) 100 MG tablet Take 1 tablet (100 mg total) by mouth daily. 90 tablet 1  . DULoxetine (CYMBALTA) 60 MG capsule Take 60 mg by mouth daily.    Marland Kitchen gabapentin (NEURONTIN) 800 MG tablet Take 1 tablet by mouth three times a day (Patient  taking differently: Taking 600 mg three times daily.) 270 tablet 0  . hydrOXYzine (ATARAX) 25 MG tablet Take 25 mg by mouth as needed.    . Melatonin 10 MG CAPS Take 10 mg by mouth See admin instructions. Take 10 mg at bedtime as needed for sleep, may take a second 10 mg dose during the night as needed    . norethindrone (AYGESTIN) 5 MG tablet Take 1 tablet (5 mg total) by mouth daily. 90 tablet 0  . omeprazole (PRILOSEC) 40 MG capsule Take 1 capsule (40 mg total) by mouth daily. 90 capsule 3  . ondansetron (ZOFRAN)  4 MG tablet Take 1 tablet (4 mg total) by mouth every 8 (eight) hours as needed for nausea or vomiting. 20 tablet 0  . Oxycodone HCl 10 MG TABS Take 1/2 - 1 tablet by mouth every 4 hours as needed for pain. 180 tablet 0  . Oxycodone HCl 10 MG TABS Take 0.5-1 tablets (5-10 mg total) by mouth every 4 (four) hours as needed for pain. 180 tablet 0  . Oxycodone HCl 10 MG TABS Take 0.5-1 tablets (5-10 mg total) by mouth every 4 (four) hours as needed for pain. 180 tablet 0  . spironolactone (ALDACTONE) 50 MG tablet Take 50 mg by mouth daily.    Marland Kitchen tiZANidine (ZANAFLEX) 2 MG tablet Take 1 tablet by mouth four times a day. 120 tablet 2  . valACYclovir (VALTREX) 1000 MG tablet Take 1,000 mg by mouth as needed.     No facility-administered medications prior to visit.      ROS:  Review of Systems  Constitutional:  Positive for fatigue. Negative for fever.  Gastrointestinal:  Negative for blood in stool, constipation, diarrhea, nausea and vomiting.  Genitourinary:  Positive for menstrual problem and pelvic pain. Negative for dyspareunia, dysuria, flank pain, frequency, hematuria, urgency, vaginal bleeding, vaginal discharge and vaginal pain.  Musculoskeletal:  Positive for arthralgias. Negative for back pain.  Skin:  Negative for rash.   BREAST: No symptoms   OBJECTIVE:   Vitals:  There were no vitals taken for this visit.  Physical Exam Vitals reviewed.  Constitutional:       Appearance: She is well-developed.  Pulmonary:     Effort: Pulmonary effort is normal.  Musculoskeletal:        General: Normal range of motion.     Cervical back: Normal range of motion.  Skin:    General: Skin is warm and dry.  Neurological:     General: No focal deficit present.     Mental Status: She is alert and oriented to person, place, and time.     Cranial Nerves: No cranial nerve deficit.  Psychiatric:        Mood and Affect: Mood normal.        Behavior: Behavior normal.        Thought Content: Thought content normal.        Judgment: Judgment normal.    Assessment/Plan: Abnormal uterine bleeding (AUB) - Plan: TSH + free T4, norethindrone (AYGESTIN) 5 MG tablet; sx most likely due to perimenopause, rule out thyroid. Few months of AUB resolved. Discussed hormones, IUD, ablation. Given mood sx that are very bothersome to pt, will try POPs for cycle and mood control. Rx eRxd. Pt to [facility] in 2-3 months for f/u and exam.   Thyroid disorder screening - Plan: TSH + free T4  Encounter for initial prescription of contraceptive pills - Plan: norethindrone (AYGESTIN) 5 MG tablet  PMDD (premenstrual dysphoric disorder) - Plan: norethindrone (AYGESTIN) 5 MG tablet, try POPs to stop cycling.   Perimenopause - Plan: norethindrone (AYGESTIN) 5 MG tablet  Will do pap smear at f/u appt. Pt bleeding very heavy today and declines exam.   Meds ordered this encounter                             . norethindrone (AYGESTIN) 5 MG tablet    Sig: Take 1 tablet (5 mg total) by mouth daily.    Dispense:  90 tablet    Refill:  0  USE THIS RX!! Pls discontinue the [facility] Rx from 01/21/23. Thank you.    Order Specific Question:   Supervising Provider    Answer:   Hildred Laser [[id number]]      No follow-ups on file.  Creta Dorame B. Selene Peltzer, PA-C 03/26/2023 9:00 AM

## 2023-04-03 ENCOUNTER — Other Ambulatory Visit: Payer: Self-pay

## 2023-04-05 ENCOUNTER — Other Ambulatory Visit: Payer: Self-pay | Admitting: Obstetrics and Gynecology

## 2023-04-05 DIAGNOSIS — N939 Abnormal uterine and vaginal bleeding, unspecified: Secondary | ICD-10-CM

## 2023-04-05 DIAGNOSIS — Z30011 Encounter for initial prescription of contraceptive pills: Secondary | ICD-10-CM

## 2023-04-05 DIAGNOSIS — F3281 Premenstrual dysphoric disorder: Secondary | ICD-10-CM

## 2023-04-05 DIAGNOSIS — N951 Menopausal and female climacteric states: Secondary | ICD-10-CM

## 2023-04-08 NOTE — Telephone Encounter (Signed)
err

## 2023-04-10 ENCOUNTER — Emergency Department: Payer: No Typology Code available for payment source

## 2023-04-10 ENCOUNTER — Other Ambulatory Visit: Payer: Self-pay

## 2023-04-10 ENCOUNTER — Emergency Department
Admission: EM | Admit: 2023-04-10 | Discharge: 2023-04-10 | Disposition: A | Discharge status: Home / Self Care | Payer: No Typology Code available for payment source | Attending: Emergency Medicine | Admitting: Emergency Medicine

## 2023-04-10 ENCOUNTER — Encounter: Payer: Self-pay | Admitting: *Deleted

## 2023-04-10 DIAGNOSIS — M25551 Pain in right hip: Secondary | ICD-10-CM | POA: Insufficient documentation

## 2023-04-10 LAB — CBC WITH DIFFERENTIAL/PLATELET
Abs Immature Granulocytes: 0.02 10*3/uL (ref 0.00–0.07)
Basophils Absolute: 0.1 10*3/uL (ref 0.0–0.1)
Basophils Relative: 1 %
Eosinophils Absolute: 0.1 10*3/uL (ref 0.0–0.5)
Eosinophils Relative: 1 %
HCT: 36.7 % (ref 36.0–46.0)
Hemoglobin: 12 g/dL (ref 12.0–15.0)
Immature Granulocytes: 0 %
Lymphocytes Relative: 39 %
Lymphs Abs: 3.5 10*3/uL (ref 0.7–4.0)
MCH: 26.9 pg (ref 26.0–34.0)
MCHC: 32.7 g/dL (ref 30.0–36.0)
MCV: 82.3 fL (ref 80.0–100.0)
Monocytes Absolute: 0.5 10*3/uL (ref 0.1–1.0)
Monocytes Relative: 6 %
Neutro Abs: 4.7 10*3/uL (ref 1.7–7.7)
Neutrophils Relative %: 53 %
Platelets: 336 10*3/uL (ref 150–400)
RBC: 4.46 MIL/uL (ref 3.87–5.11)
RDW: 14.8 % (ref 11.5–15.5)
WBC: 8.9 10*3/uL (ref 4.0–10.5)
nRBC: 0 % (ref 0.0–0.2)

## 2023-04-10 LAB — BASIC METABOLIC PANEL
Anion gap: 8 (ref 5–15)
BUN: 15 mg/dL (ref 6–20)
CO2: 24 mmol/L (ref 22–32)
Calcium: 8.5 mg/dL — ABNORMAL LOW (ref 8.9–10.3)
Chloride: 105 mmol/L (ref 98–111)
Creatinine, Ser: 0.71 mg/dL (ref 0.44–1.00)
GFR, Estimated: >60 mL/min (ref >60)
Glucose, Bld: 83 mg/dL (ref 70–99)
Potassium: 3.9 mmol/L (ref 3.5–5.1)
Sodium: 137 mmol/L (ref 135–145)

## 2023-04-10 NOTE — ED Triage Notes (Signed)
Pt with chronic pain is here for worsening right hip and neck pain.  She describes this like "muscles are freezing up".  Pt is in pain management and has an appointment Monday but pain is too severe to wait.  No recent trauma.

## 2023-04-10 NOTE — Discharge Instructions (Signed)
Please seek medical attention for any high fevers, chest pain, shortness of breath, change in behavior, persistent vomiting, bloody stool or any other new or concerning symptoms.  

## 2023-04-10 NOTE — ED Provider Notes (Signed)
[facility] Provider Note    Event Date/Time   First MD Initiated Contact with Patient 04/10/23 1938     (approximate)   History   Pain   HPI  Angela Massey is a 47 y.o. female who presents to the emergency department today because of concerns for severe right hip pain.  The patient states that she has a history of chronic pain.  However this is primarily related to her neck and back.  Over the past few months however she started developing pain deep in her right hip.  She denies any trauma to her hip.  The pain has become more severe over the past couple of weeks.  She is on a home pain regimen of oxycodone, Tylenol and gabapentin.  While this has continued to help with the spinal pain it is not helping with the right hip pain.     Physical Exam   Triage Vital Signs: ED Triage Vitals  Encounter Vitals Group     BP 04/10/23 1754 (!) 153/91     Systolic BP Percentile --      Diastolic BP Percentile --      Pulse Rate 04/10/23 1754 70     Resp 04/10/23 1754 18     Temp 04/10/23 1754 98.3 F (36.8 C)     Temp src --      SpO2 04/10/23 1754 99 %     Weight --      Height --      Head Circumference --      Peak Flow --      Pain Score 04/10/23 1753 10     Pain Loc --      Pain Education --      Exclude from Growth Chart --     Most recent vital signs: Vitals:   04/10/23 1754  BP: (!) 153/91  Pulse: 70  Resp: 18  Temp: 98.3 F (36.8 C)  SpO2: 99%   General: Awake, alert, oriented. CV:  Good peripheral perfusion.  Resp:  Normal effort.  Abd:  No distention.  Other:  Right hip with tenderness to palpation over the trochanter and some tenderness with manipulation. DP 2+.   ED Results / Procedures / Treatments   Labs (all labs ordered are listed, but only abnormal results are displayed) Labs Reviewed  BASIC METABOLIC PANEL - Abnormal; Notable for the following components:      Result Value   Calcium 8.5 (*)    All other components  within normal limits  CBC WITH DIFFERENTIAL/PLATELET  C-REACTIVE PROTEIN     EKG  None   RADIOLOGY I independently interpreted and visualized the right hip. My interpretation: No fracture Radiology interpretation:  IMPRESSION:  No acute osseous abnormalities.     PROCEDURES:  Critical Care performed: No  MEDICATIONS ORDERED IN ED: Medications - No data to display   IMPRESSION / MDM / ASSESSMENT AND PLAN / ED COURSE  I reviewed the triage vital signs and the nursing notes.                              Differential diagnosis includes, but is not limited to, fracture, dislocation, infection, inflammation  Patient's presentation is most consistent with acute presentation with potential threat to life or bodily function.   Patient presented to the emergency department today because of concerns for severe right hip pain.  Has been going on for months.  Chest x-ray without any concerning osseous abnormality.  At this time think it be reasonable for patient to be discharged to follow-up with her pain doctor.  Additionally will give patient orthopedic follow-up information.      FINAL CLINICAL IMPRESSION(S) / ED DIAGNOSES   Final diagnoses:  Right hip pain    Note:  This document was prepared using Dragon voice recognition software and may include unintentional dictation errors.    Phineas Semen, MD 04/10/23 2242

## 2023-04-10 NOTE — ED Notes (Signed)
Patient transported to X-ray 

## 2023-04-11 LAB — C-REACTIVE PROTEIN: CRP: 0.7 mg/dL (ref <1.0)

## 2023-04-15 ENCOUNTER — Other Ambulatory Visit (HOSPITAL_COMMUNITY): Payer: Self-pay

## 2023-04-15 MED ORDER — OXYCODONE HCL 10 MG PO TABS
5.0000 mg | ORAL_TABLET | ORAL | 0 refills | Status: DC | PRN
  Filled 2023-04-15 – 2023-04-19 (×2): qty 180, 30d supply, fill #0

## 2023-04-17 ENCOUNTER — Ambulatory Visit: Payer: No Typology Code available for payment source | Admitting: Gastroenterology

## 2023-04-18 ENCOUNTER — Other Ambulatory Visit (HOSPITAL_COMMUNITY): Payer: Self-pay

## 2023-04-19 ENCOUNTER — Other Ambulatory Visit (HOSPITAL_COMMUNITY): Payer: Self-pay

## 2023-04-23 ENCOUNTER — Ambulatory Visit: Payer: Medicare Other | Admitting: Obstetrics and Gynecology

## 2023-05-22 NOTE — Progress Notes (Unsigned)
Alfredia Ferguson, PA-C   No chief complaint on file.   HPI:      [name] is a 47 y.o. [phone] whose LMP was No LMP recorded. Patient is premenopausal., presents today for AUB f/u from 7/24, started on aygestin 5 mg. Also due for pap    Patient Active Problem List   Diagnosis Date Noted   Peripheral neuropathy 01/02/2023   Gastroesophageal reflux disease 01/02/2023   Acne vulgaris 01/02/2023   Bilateral hearing loss 01/02/2023   Tobacco use 01/02/2023   Lumbar radiculopathy 09/20/2021   Degenerative spondylolisthesis 03/24/2021   Cervical lymphadenopathy 09/06/2020   Pharyngeal mass 09/06/2020   GAD (generalized anxiety disorder) 10/11/2015   Panic disorder without agoraphobia 10/11/2015   Insomnia 10/11/2015   Hyperprolactinemia (HCC) 05/26/2013   Depression, major 05/26/2013   Pituitary adenoma (HCC) 05/06/2013    Past Surgical History:  Procedure Laterality Date   Diagnostic Laparotomy     LAPAROSCOPIC GASTRIC SLEEVE RESECTION  10/31/2019   planter facitis     TUBAL LIGATION      Family History  Problem Relation Age of Onset   Cancer Mother        squamous cell   Asthma Mother    Depression Mother    Anxiety disorder Mother    Cancer Father        squamous cell; nonhodgkin's lymphoma   Alcohol abuse Father    Diabetes Maternal Grandmother    Emphysema Maternal Grandmother    Stroke Paternal Grandfather     Social History   Socioeconomic History   Marital status: Divorced    Spouse name: Not on file   Number of children: 6   Years of education: 14   Highest education level: Associate degree: academic program  Occupational History   Occupation: Event organiser: [employer]  Tobacco Use   Smoking status: Some Days    Current packs/day: 0.25    Average packs/day: 0.3 packs/day for 10.0 years (2.5 ttl pk-yrs)    Types: Cigarettes   Smokeless tobacco: Never   Tobacco comments:    Start since 29 regularly, 4-5 cig daily, vapes as  well   Vaping Use   Vaping status: Every Day   Substances: Nicotine  Substance and Sexual Activity   Alcohol use: Not Currently    Alcohol/week: 0.0 standard drinks of alcohol   Drug use: No   Sexual activity: Yes    Partners: Male    Birth control/protection: Surgical    Comment: Tubal Ligation  Other Topics Concern   Not on file  Social History Narrative   Born in [place] and raised there until 10 by mom and step dad. Biological parents divorced when she was 61mo. She then moved to [place]. At 47yo she moved to [place] by herself for [employer] and then moved to [place] and lived there for 17ys. Pt has one older brother and one older sister. Pt has an associates degree in Surveyor, minerals. 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 Pt has been married 3x. Current marriage for 10 yrs and has 3 biological kids and 3 adopted kids.    Social Determinants of Health   Financial Resource Strain: Low Risk  (02/12/2023)   Overall Financial Resource Strain (CARDIA)    Difficulty of Paying Living Expenses: Not very hard  Food Insecurity: No Food Insecurity (02/12/2023)   Hunger Vital Sign    Worried About Running Out of Food  in the Last Year: Never true    Ran Out of Food in the Last Year: Never true  Transportation Needs: No Transportation Needs (02/12/2023)   PRAPARE - Administrator, Civil Service (Medical): No    Lack of Transportation (Non-Medical): No  Physical Activity: Insufficiently Active (02/12/2023)   Exercise Vital Sign    Days of Exercise per Week: 2 days    Minutes of Exercise per Session: 30 min  Stress: Stress Concern Present (02/12/2023)   [employer] of Occupational Health - Occupational Stress Questionnaire    Feeling of Stress : To some extent  Social Connections: Moderately Isolated (02/12/2023)   Social Connection and Isolation Panel [NHANES]    Frequency of Communication with Friends and Family: Twice a week    Frequency  of Social Gatherings with Friends and Family: Once a week    Attends Religious Services: More than 4 times per year    Active Member of [employer] or Organizations: No    Attends Banker Meetings: Never    Marital Status: Divorced  Catering manager Violence: Not At Risk (01/02/2023)   Humiliation, Afraid, Rape, and Kick questionnaire    Fear of Current or Ex-Partner: No    Emotionally Abused: No    Physically Abused: No    Sexually Abused: No    Outpatient Medications Prior to Visit  Medication Sig Dispense Refill   acetaminophen (TYLENOL) 650 MG CR tablet Take 1,300 mg by mouth every 8 (eight) hours as needed for pain.     aspirin-acetaminophen-caffeine (EXCEDRIN MIGRAINE) 250-250-65 MG tablet Take 2 tablets by mouth every 6 (six) hours as needed for headache.     buPROPion (WELLBUTRIN XL) 150 MG 24 hr tablet Take 150 mg by mouth every morning.     buPROPion (WELLBUTRIN XL) 300 MG 24 hr tablet Take 450 mg by mouth daily. Takes with 150 mg     doxycycline (VIBRA-TABS) 100 MG tablet Take 1 tablet (100 mg total) by mouth daily. 90 tablet 1   DULoxetine (CYMBALTA) 60 MG capsule Take 60 mg by mouth daily.     gabapentin (NEURONTIN) 800 MG tablet Take 1 tablet by mouth three times a day (Patient taking differently: Taking 600 mg three times daily.) 270 tablet 0   hydrOXYzine (ATARAX) 25 MG tablet Take 25 mg by mouth as needed.     Melatonin 10 MG CAPS Take 10 mg by mouth See admin instructions. Take 10 mg at bedtime as needed for sleep, may take a second 10 mg dose during the night as needed     norethindrone (AYGESTIN) 5 MG tablet Take 1 tablet (5 mg total) by mouth daily. 90 tablet 0   omeprazole (PRILOSEC) 40 MG capsule Take 1 capsule (40 mg total) by mouth daily. 90 capsule 3   ondansetron (ZOFRAN) 4 MG tablet Take 1 tablet (4 mg total) by mouth every 8 (eight) hours as needed for nausea or vomiting. 20 tablet 0   Oxycodone HCl 10 MG TABS Take 1/2 - 1 tablet by mouth every 4 hours as  needed for pain. 180 tablet 0   Oxycodone HCl 10 MG TABS Take 0.5-1 tablets (5-10 mg total) by mouth every 4 (four) hours as needed for pain. 180 tablet 0   Oxycodone HCl 10 MG TABS Take 0.5-1 tablets (5-10 mg total) by mouth every 4 (four) hours as needed for pain. 180 tablet 0   Oxycodone HCl 10 MG TABS Take 0.5-1 tablets (5-10 mg total) by mouth  every 4 (four) hours as needed for pain. 180 tablet 0   spironolactone (ALDACTONE) 50 MG tablet Take 50 mg by mouth daily.     tiZANidine (ZANAFLEX) 2 MG tablet Take 1 tablet by mouth four times a day. 120 tablet 2   valACYclovir (VALTREX) 1000 MG tablet Take 1,000 mg by mouth as needed.     No facility-administered medications prior to visit.      ROS:  Review of Systems BREAST: No symptoms   OBJECTIVE:   Vitals:  There were no vitals taken for this visit.  Physical Exam  Results: No results found for this or any previous visit (from the past 24 hour(s)).   Assessment/Plan: No diagnosis found.    No orders of the defined types were placed in this encounter.     No follow-ups on file.  Jayce Boyko B. Ellinor Test, PA-C 05/22/2023 4:39 PM

## 2023-05-23 ENCOUNTER — Other Ambulatory Visit (HOSPITAL_COMMUNITY): Payer: Self-pay

## 2023-05-23 ENCOUNTER — Ambulatory Visit (INDEPENDENT_AMBULATORY_CARE_PROVIDER_SITE_OTHER): Payer: Medicare Other | Admitting: Obstetrics and Gynecology

## 2023-05-23 ENCOUNTER — Other Ambulatory Visit (HOSPITAL_COMMUNITY)
Admission: RE | Admit: 2023-05-23 | Discharge: 2023-05-23 | Disposition: A | Discharge status: Home / Self Care | Payer: Self-pay | Source: Ambulatory Visit | Attending: Obstetrics and Gynecology | Admitting: Obstetrics and Gynecology

## 2023-05-23 ENCOUNTER — Ambulatory Visit (INDEPENDENT_AMBULATORY_CARE_PROVIDER_SITE_OTHER): Payer: Medicare Other | Admitting: Gastroenterology

## 2023-05-23 ENCOUNTER — Encounter: Payer: Self-pay | Admitting: Obstetrics and Gynecology

## 2023-05-23 VITALS — BP 137/83 | HR 80 | Ht 61.0 in | Wt 182.0 lb

## 2023-05-23 DIAGNOSIS — N951 Menopausal and female climacteric states: Secondary | ICD-10-CM | POA: Diagnosis not present

## 2023-05-23 DIAGNOSIS — Z124 Encounter for screening for malignant neoplasm of cervix: Secondary | ICD-10-CM | POA: Insufficient documentation

## 2023-05-23 DIAGNOSIS — Z01419 Encounter for gynecological examination (general) (routine) without abnormal findings: Secondary | ICD-10-CM | POA: Diagnosis not present

## 2023-05-23 DIAGNOSIS — Z3041 Encounter for surveillance of contraceptive pills: Secondary | ICD-10-CM | POA: Diagnosis not present

## 2023-05-23 DIAGNOSIS — K219 Gastro-esophageal reflux disease without esophagitis: Secondary | ICD-10-CM

## 2023-05-23 DIAGNOSIS — E65 Localized adiposity: Secondary | ICD-10-CM

## 2023-05-23 DIAGNOSIS — F3281 Premenstrual dysphoric disorder: Secondary | ICD-10-CM

## 2023-05-23 DIAGNOSIS — Z9884 Bariatric surgery status: Secondary | ICD-10-CM

## 2023-05-23 DIAGNOSIS — R112 Nausea with vomiting, unspecified: Secondary | ICD-10-CM

## 2023-05-23 DIAGNOSIS — Z1151 Encounter for screening for human papillomavirus (HPV): Secondary | ICD-10-CM | POA: Insufficient documentation

## 2023-05-23 DIAGNOSIS — N939 Abnormal uterine and vaginal bleeding, unspecified: Secondary | ICD-10-CM

## 2023-05-23 MED ORDER — NORETHINDRONE ACETATE 5 MG PO TABS: 5.0000 mg | ORAL_TABLET | Freq: Every day | ORAL | 3 refills | Status: AC

## 2023-05-23 MED ORDER — OXYCODONE HCL 10 MG PO TABS
5.0000 mg | ORAL_TABLET | ORAL | 0 refills | Status: DC | PRN
  Filled 2023-05-23: qty 180, 30d supply, fill #0

## 2023-05-23 NOTE — Patient Instructions (Signed)
I value your feedback and you entrusting us with your care. If you get a [facility] patient survey, I would appreciate you taking the time to let us know about your experience today. Thank you! ? ? ?

## 2023-05-23 NOTE — Progress Notes (Signed)
Gastroenterology Consultation  Referring Provider:     Alfredia Ferguson, PA-C Primary Care Physician:  Alfredia Ferguson, PA-C Primary Gastroenterologist:  Dr. Servando Snare     Reason for Consultation:     Nausea        HPI:   Angela Massey is a 47 y.o. y/o female referred for consultation & management of nausea by Dr. Alfredia Ferguson, PA-C.  This patient comes in today with a history of nausea.  She states has been going on for some time but has been better on Zofran.  The patient states that she usually has the nausea in the morning usually approximately 1 hour after she eats.  She states that it has been accompanied by dry heaving.  The patient is status post sleeve gastrectomy and she states that she has lost significant amounts of weight.  The patient denies any symptoms in the evening or afternoon.  The patient had her gastric bypass surgery in [place] and she reports that at that time she was told that she should be on a PPI.  The patient has been on 40 mg of omeprazole every morning.  There is no report of any black stools or bloody stools.  She also denies any hematemesis.  There is no report of any right upper quadrant pain or radiation to her right shoulder.  She also denies that this is any better or worse with greasy or fatty foods.  Past Medical History:  Diagnosis Date   Anemia    Anxiety    Arthritis    back and ankles   Depression    Fatty liver    Fibromyalgia    GERD (gastroesophageal reflux disease)    Headache    Hypertriglyceridemia    Ovarian abscess    PCOS (polycystic ovarian syndrome)    PONV (postoperative nausea and vomiting)    Pseudotumor    Sciatica     Past Surgical History:  Procedure Laterality Date   Diagnostic Laparotomy     LAPAROSCOPIC GASTRIC SLEEVE RESECTION  10/31/2019   planter facitis     TUBAL LIGATION      Prior to Admission medications   Medication Sig Start Date End Date Taking? Authorizing Provider  acetaminophen (TYLENOL) 650 MG CR  tablet Take 1,300 mg by mouth every 8 (eight) hours as needed for pain.   Yes [provider]  aspirin-acetaminophen-caffeine (EXCEDRIN MIGRAINE) [phone] MG tablet Take 2 tablets by mouth every 6 (six) hours as needed for headache.   Yes [provider]  buPROPion (WELLBUTRIN XL) 150 MG 24 hr tablet Take 150 mg by mouth every morning. 12/06/21  Yes [provider]  buPROPion (WELLBUTRIN XL) 300 MG 24 hr tablet Take 450 mg by mouth daily. Takes with 150 mg   Yes [provider]  doxycycline (ADOXA) 100 MG tablet Take 100 mg by mouth 2 (two) times daily. 05/02/23  Yes [provider]  doxycycline (VIBRA-TABS) 100 MG tablet Take 1 tablet (100 mg total) by mouth daily. 01/02/23  Yes Alfredia Ferguson, PA-C  DULoxetine (CYMBALTA) 60 MG capsule Take 60 mg by mouth daily.   Yes [provider]  gabapentin (NEURONTIN) 600 MG tablet Take 600 mg by mouth 3 (three) times daily. 03/10/23  Yes [provider]  hydrOXYzine (ATARAX) 25 MG tablet Take 25 mg by mouth as needed. 12/04/22  Yes [provider]  Melatonin 10 MG CAPS Take 10 mg by mouth See admin instructions. Take 10 mg at bedtime as  needed for sleep, may take a second 10 mg dose during the night as needed   Yes [provider]  norethindrone (AYGESTIN) 5 MG tablet Take 1 tablet (5 mg total) by mouth daily. 05/23/23  Yes Copland, Ilona Sorrel, PA-C  omeprazole (PRILOSEC) 40 MG capsule Take 1 capsule (40 mg total) by mouth daily. 01/02/23  Yes Drubel, Lillia Abed, PA-C  ondansetron (ZOFRAN) 4 MG tablet Take 1 tablet (4 mg total) by mouth every 8 (eight) hours as needed for nausea or vomiting. 03/07/23  Yes Ostwalt, Edmon Crape, PA-C  Oxycodone HCl 10 MG TABS Take 0.5-1 tablets (5-10 mg total) by mouth every 4 (four) hours as needed for pain 05/23/23  Yes   spironolactone (ALDACTONE) 50 MG tablet Take 50 mg by mouth daily. 11/27/22  Yes [provider]  tiZANidine (ZANAFLEX) 2 MG tablet Take 1  tablet by mouth four times a day. 02/21/22  Yes   valACYclovir (VALTREX) 1000 MG tablet Take 1,000 mg by mouth as needed. 12/20/22  Yes [provider]    Family History  Problem Relation Age of Onset   Cancer Mother        squamous cell   Asthma Mother    Depression Mother    Anxiety disorder Mother    Cancer Father        squamous cell; nonhodgkin's lymphoma   Alcohol abuse Father    Diabetes Maternal Grandmother    Emphysema Maternal Grandmother    Stroke Paternal Grandfather      Social History   Tobacco Use   Smoking status: Some Days    Current packs/day: 0.25    Average packs/day: 0.3 packs/day for 10.0 years (2.5 ttl pk-yrs)    Types: Cigarettes   Smokeless tobacco: Never   Tobacco comments:    Start since 29 regularly, 4-5 cig daily, vapes as well   Vaping Use   Vaping status: Every Day   Substances: Nicotine  Substance Use Topics   Alcohol use: Not Currently    Alcohol/week: 0.0 standard drinks of alcohol   Drug use: No    Allergies as of 05/23/2023 - Review Complete 05/23/2023  Allergen Reaction Noted   Codeine Hives 01/28/2012   Phenergan [promethazine hcl]  01/28/2012   Seroquel [quetiapine fumarate]  10/11/2015   Sulfa antibiotics Hives 11/03/2020    Review of Systems:    All systems reviewed and negative except where noted in HPI.   Physical Exam:  There were no vitals taken for this visit. No LMP recorded. Patient is premenopausal. General:   Alert,  Well-developed, well-nourished, pleasant and cooperative in NAD Head:  Normocephalic and atraumatic. Eyes:  Sclera clear, no icterus.   Conjunctiva pink. Ears:  Normal auditory acuity. Neck:  Supple; no masses or thyromegaly. Lungs:  Respirations even and unlabored.  Clear throughout to auscultation.   No wheezes, crackles, or rhonchi. No acute distress. Heart:  Regular rate and rhythm; no murmurs, clicks, rubs, or gallops. Abdomen:  Normal bowel sounds.  No bruits.  Soft, non-tender and  non-distended without masses, hepatosplenomegaly or hernias noted.  No guarding or rebound tenderness.  Negative Carnett sign.   Rectal:  Deferred.  Pulses:  Normal pulses noted. Extremities:  No clubbing or edema.  No cyanosis. Neurologic:  Alert and oriented x3;  grossly normal neurologically. Skin:  Intact without significant lesions or rashes.  No jaundice. Lymph Nodes:  No significant cervical adenopathy. Psych:  Alert and cooperative. Normal mood and affect.  Imaging Studies: No results found.  Assessment and Plan:   SHIRLA SAILORS is a 47 y.o. y/o female who comes in today with nausea.  The nausea is primarily in the morning.  The patient has been told to change her PPI to an evening dose because nocturnal acid reflux can be causing her to have her nausea in the morning.  The patient has also been told that she has not had a colonoscopy and she reported that her brother was found to have polyps and she was recommended to have a colonoscopy.  The patient was also told that since she has this sleeve gastrectomy with chronic nausea the patient should also undergo an EGD.  The patient states that she would like to set these up for at the beginning of the year because of her work.  The patient will switch the PPI to the evening and will have her EGD and colonoscopy after January 1.  The patient will remain on the Zofran to be taken as needed.  The patient has been explained the plan and agrees with it.    Midge Minium, MD. Clementeen Graham    Note: This dictation was prepared with Dragon dictation along with smaller phrase technology. Any transcriptional errors that result from this process are unintentional.

## 2023-05-28 ENCOUNTER — Telehealth: Payer: Self-pay

## 2023-05-28 LAB — CYTOLOGY - PAP
Comment: NEGATIVE
Diagnosis: NEGATIVE
High risk HPV: NEGATIVE

## 2023-05-28 NOTE — Telephone Encounter (Signed)
Pt calling; pharm hasn't recv'd rx for bc. Adv pt I would call the pharm, [facility], and call her back.  She states to leave a message as she works in a [employer]. [phone]  Female at [facility] pharm states they are getting that rx ready for her today; they will text her when it is ready for p/u; they had to order the medication.  Left detailed msg on pt's phone.

## 2023-06-24 ENCOUNTER — Other Ambulatory Visit (HOSPITAL_COMMUNITY): Payer: Self-pay

## 2023-06-24 ENCOUNTER — Ambulatory Visit: Payer: Medicare Other | Admitting: Physician Assistant

## 2023-06-24 MED ORDER — OXYCODONE HCL 10 MG PO TABS
5.0000 mg | ORAL_TABLET | ORAL | 0 refills | Status: DC | PRN
Start: 2023-06-24 — End: 2023-07-22
  Filled 2023-06-24: qty 180, 30d supply, fill #0

## 2023-07-09 ENCOUNTER — Other Ambulatory Visit: Payer: Self-pay | Admitting: Physician Assistant

## 2023-07-09 DIAGNOSIS — R110 Nausea: Secondary | ICD-10-CM

## 2023-07-09 NOTE — Telephone Encounter (Signed)
 Requested medication (s) are due for refill today - unsure  Requested medication (s) are on the active medication list -yes  Future visit scheduled -no  Last refill: 03/07/23 #20  Notes to clinic: duplicate request- non delegated Rx  Requested Prescriptions  Pending Prescriptions Disp Refills   ondansetron  (ZOFRAN ) 4 MG tablet 20 tablet 0    Sig: Take 1 tablet (4 mg total) by mouth every 8 (eight) hours as needed for nausea or vomiting.     Not Delegated - Gastroenterology: Antiemetics - ondansetron  Failed - 07/09/2023 12:10 PM      Failed - This refill cannot be delegated      Failed - AST in normal range and within 360 days    AST  Date Value Ref Range Status  07/10/2022 20 15 - 41 U/L Final         Failed - ALT in normal range and within 360 days    ALT  Date Value Ref Range Status  07/10/2022 19 0 - 44 U/L Final         Passed - Valid encounter within last 6 months    Recent Outpatient Visits           4 months ago Neck pain   [place] [place], [place], PA-C   4 months ago Nausea   [facility] [place], [place], PA-C   6 months ago Lumbar radiculopathy   [facility] Cyndi Shaver, [place]                 Requested Prescriptions  Pending Prescriptions Disp Refills   ondansetron  (ZOFRAN ) 4 MG tablet 20 tablet 0    Sig: Take 1 tablet (4 mg total) by mouth every 8 (eight) hours as needed for nausea or vomiting.     Not Delegated - Gastroenterology: Antiemetics - ondansetron  Failed - 07/09/2023 12:10 PM      Failed - This refill cannot be delegated      Failed - AST in normal range and within 360 days    AST  Date Value Ref Range Status  07/10/2022 20 15 - 41 U/L Final         Failed - ALT in normal range and within 360 days    ALT  Date Value Ref Range Status  07/10/2022 19 0 - 44 U/L Final         Passed - Valid encounter within last 6 months    Recent Outpatient Visits            4 months ago Neck pain   [facility] [place], [place], PA-C   4 months ago Nausea   [place] [facility] [place], [place], [place]   6 months ago Lumbar radiculopathy   [facility] Cyndi Shaver, PA-C

## 2023-07-09 NOTE — Telephone Encounter (Signed)
 Requested medication (s) are due for refill today - unsure  Requested medication (s) are on the active medication list -yes  Future visit scheduled -no  Last refill: 03/07/23 #20  Notes to clinic: non delegated Rx  Requested Prescriptions  Pending Prescriptions Disp Refills   ondansetron  (ZOFRAN ) 4 MG tablet [Pharmacy Med Name: ONDANSETRON  4 MG TAB] 20 tablet 0    Sig: TAKE ONE TABLET BY MOUTH EVERY 8 HOURS AS NEEDED FOR NAUSEA OR VOMITING     Not Delegated - Gastroenterology: Antiemetics - ondansetron  Failed - 07/09/2023 12:08 PM      Failed - This refill cannot be delegated      Failed - AST in normal range and within 360 days    AST  Date Value Ref Range Status  07/10/2022 20 15 - 41 U/L Final         Failed - ALT in normal range and within 360 days    ALT  Date Value Ref Range Status  07/10/2022 19 0 - 44 U/L Final         Passed - Valid encounter within last 6 months    Recent Outpatient Visits           4 months ago Neck pain   [place] [facility] [place], [place], PA-C   4 months ago Nausea   [place] [facility] [place], [place], PA-C   6 months ago Lumbar radiculopathy   [facility] Cyndi Shaver, [place]                 Requested Prescriptions  Pending Prescriptions Disp Refills   ondansetron  (ZOFRAN ) 4 MG tablet [Pharmacy Med Name: ONDANSETRON  4 MG TAB] 20 tablet 0    Sig: TAKE ONE TABLET BY MOUTH EVERY 8 HOURS AS NEEDED FOR NAUSEA OR VOMITING     Not Delegated - Gastroenterology: Antiemetics - ondansetron  Failed - 07/09/2023 12:08 PM      Failed - This refill cannot be delegated      Failed - AST in normal range and within 360 days    AST  Date Value Ref Range Status  07/10/2022 20 15 - 41 U/L Final         Failed - ALT in normal range and within 360 days    ALT  Date Value Ref Range Status  07/10/2022 19 0 - 44 U/L Final         Passed - Valid encounter within last 6 months     Recent Outpatient Visits           4 months ago Neck pain   [place] [facility] [place], [place], PA-C   4 months ago Nausea   [facility] [place], [place], PA-C   6 months ago Lumbar radiculopathy   [facility] Cyndi Shaver, PA-C

## 2023-07-09 NOTE — Telephone Encounter (Signed)
 Medication Refill -  Most Recent Primary Care Visit:  Provider: OSTWALT, JANNA  Department: [facility]  Visit Type: OFFICE VISIT  Date: 03/07/2023  Medication: ondansetron  (ZOFRAN ) 4 MG tablet   Pharmacy called to request refill.  Please advise.   Has the patient contacted their pharmacy? Yes  (Agent: If yes, when and what did the pharmacy advise?)  Is this the correct pharmacy for this prescription? Yes If no, delete pharmacy and type the correct one.  This is the patient's preferred pharmacy:  [facility] [address] - [place], [place] - [address] [facility] [address] [place] [address] Phone: [phone] Fax: [phone]   Has the prescription been filled recently? Yes  Is the patient out of the medication? Yes  Has the patient been seen for an appointment in the last year OR does the patient have an upcoming appointment? No  Can we respond through MyChart? Yes  Agent: Please be advised that Rx refills may take up to 3 business days. We ask that you follow-up with your pharmacy.

## 2023-07-15 ENCOUNTER — Institutional Professional Consult (permissible substitution): Payer: Medicare Other | Admitting: Plastic Surgery

## 2023-07-22 ENCOUNTER — Other Ambulatory Visit (HOSPITAL_COMMUNITY): Payer: Self-pay

## 2023-07-22 MED ORDER — OXYCODONE HCL 10 MG PO TABS
5.0000 mg | ORAL_TABLET | ORAL | 0 refills | Status: DC | PRN
  Filled 2023-07-22: qty 180, 30d supply, fill #0

## 2023-07-24 ENCOUNTER — Institutional Professional Consult (permissible substitution): Payer: Medicare Other | Admitting: Plastic Surgery

## 2023-08-19 ENCOUNTER — Other Ambulatory Visit (HOSPITAL_COMMUNITY): Payer: Self-pay

## 2023-08-19 MED ORDER — OXYCODONE HCL 10 MG PO TABS
5.0000 mg | ORAL_TABLET | ORAL | 0 refills | Status: DC | PRN
  Filled 2023-08-19: qty 180, 30d supply, fill #0

## 2023-08-20 ENCOUNTER — Other Ambulatory Visit (HOSPITAL_COMMUNITY): Payer: Self-pay

## 2023-09-10 ENCOUNTER — Other Ambulatory Visit: Payer: Self-pay

## 2023-09-10 ENCOUNTER — Emergency Department
Admission: EM | Admit: 2023-09-10 | Discharge: 2023-09-10 | Disposition: A | Discharge status: Home / Self Care | Attending: Emergency Medicine | Admitting: Emergency Medicine

## 2023-09-10 DIAGNOSIS — M5416 Radiculopathy, lumbar region: Secondary | ICD-10-CM

## 2023-09-10 DIAGNOSIS — M545 Low back pain, unspecified: Secondary | ICD-10-CM

## 2023-09-10 MED ORDER — PREDNISONE 20 MG PO TABS: 40.0000 mg | ORAL_TABLET | Freq: Every day | ORAL | 0 refills | Status: AC

## 2023-09-10 MED ORDER — KETOROLAC TROMETHAMINE 30 MG/ML IJ SOLN
30.0000 mg | Freq: Once | INTRAMUSCULAR | Status: AC
  Administered 2023-09-10: 30 mg via INTRAMUSCULAR
  Filled 2023-09-10: qty 1

## 2023-09-10 NOTE — Discharge Instructions (Addendum)
 Your exam is overall reassuring.  No signs of a serious neuromuscular deficit.  You may be experiencing some symptoms related to some nerve irritation in the lumbar spine.  Take prescription meds as directed.  Consider increasing your tizanidine to 3 times a day dosing.  Follow-up with your primary provider, pain management specialist, spine surgeon, or neurologist as scheduled.  Return to the ED if needed.

## 2023-09-10 NOTE — ED Notes (Signed)
 See triage notes. Patient c/o lower back pain that radiates to the hip and leg. Patient has a history of sciatica.

## 2023-09-10 NOTE — ED Triage Notes (Signed)
 Pt to ED via Pov from home. Pt reports right lower back pain and radiates to hip and leg. Pt with hx of sciatica. Pt has had recontrsuction fusion from L1 - S1.

## 2023-09-10 NOTE — ED Provider Notes (Signed)
 [facility] Emergency Department Provider Note     Event Date/Time   First MD Initiated Contact with Patient 09/10/23 1548     (approximate)   History   Back Pain   HPI  Angela Massey is a 48 y.o. female with a history of anxiety, depression, fibromyalgia, sciatica, GERD, and status post gastric sleeve and L4 to S1 lumbar fusion due to DDD, presents to the ED endorsing 3 to 4 days of persistent lower back pain.  Patient would deny any recent injury, trauma, fall.  She describes pain on the right leg that refers from the sacral junction to the right hip laterally.  She denies any referred pain to the groin or crotch.  She also denies any distal paresthesias.  Pain is aggravated by ambulation.  No bladder or bowel incontinence, foot drop, or saddle anesthesia is reported.  She takes oxycodone (10 mg) every 4 hours as well as tizanidine 2 mg as needed for her chronic pain.  She presents to the ED somewhat tearful and anxious, noted that the symptoms are familiar to her when she had severe sciatica on the left which ultimately led to her lumbar surgery.  She is under the care of pain management and expect to see neurology at [facility] next month.  Physical Exam   Triage Vital Signs: ED Triage Vitals  Encounter Vitals Group     BP 09/10/23 1407 105/67     Systolic BP Percentile --      Diastolic BP Percentile --      Pulse Rate 09/10/23 1407 66     Resp 09/10/23 1407 17     Temp 09/10/23 1407 98.9 F (37.2 C)     Temp Source 09/10/23 1407 Oral     SpO2 09/10/23 1407 100 %     Weight 09/10/23 1408 142 lb (64.4 kg)     Height 09/10/23 1408 5\' 1"  (1.549 m)     Head Circumference --      Peak Flow --      Pain Score 09/10/23 1413 10     Pain Loc --      Pain Education --      Exclude from Growth Chart --     Most recent vital signs: Vitals:   09/10/23 1407  BP: 105/67  Pulse: 66  Resp: 17  Temp: 98.9 F (37.2 C)  SpO2: 100%     General Awake, no distress. NAD HEENT NCAT. PERRL. EOMI. No rhinorrhea. Mucous membranes are moist.  CV:  Good peripheral perfusion. RRR RESP:  Normal effort. CTA ABD:  No distention.  MSK:  Normal spinal alignment without midline tenderness, spasm, deformity, or step-off.  Mildly tender to palpation to the right SI joint region.  Some mild muscle tenderness over the lateral gluteal region and lateral thigh.  Patient transitions from sit to stand without assistance.  She is able demonstrate normal hip flexion extension range in seated position. NEURO: Cranial nerves II to XII grossly intact.  Normal LE DTRs bilaterally.  Normal toe dorsiflexion and foot eversion on exam.  Negative seated straight leg raise bilaterally.   ED Results / Procedures / Treatments   Labs (all labs ordered are listed, but only abnormal results are displayed) Labs Reviewed - No data to display   EKG    RADIOLOGY  No results found.   PROCEDURES:  Critical Care performed: No  Procedures   MEDICATIONS ORDERED IN ED: Medications  ketorolac (TORADOL) 30 MG/ML  injection 30 mg (30 mg Intramuscular Given 09/10/23 1642)     IMPRESSION / MDM / ASSESSMENT AND PLAN / ED COURSE  I reviewed the triage vital signs and the nursing notes.                              Differential diagnosis includes, but is not limited to, lumbar strain, lumbar radiculopathy, sciatica  Patient's presentation is most consistent with acute presentation with potential threat to life or bodily function.  Patient's diagnosis is consistent with acute right-sided low back pain with lumbar radiculopathy.  Patient with history of lumbar radiculopathy and DDD, presents to the ED with 3 to 4 days of low back pain and referral to the right buttocks and hip.  She denies any injury or trauma preceding the onset.  Exam is overall reassuring.  No red flags on exam.  No acute neuromuscular deficits.  No indication for emergent imaging at  this presentation.  Patient is given a IM dose of Toradol.  Patient will be discharged home with prescriptions for prednisone.  She is also encouraged to up dose her tizanidine for the next few days while she is away from work.  Patient is to follow up with her specialty team including neurology, pain management as discussed, as needed or otherwise directed. Patient is given ED precautions to return to the ED for any worsening or new symptoms.  FINAL CLINICAL IMPRESSION(S) / ED DIAGNOSES   Final diagnoses:  Acute right-sided low back pain without sciatica  Lumbar radiculopathy     Rx / DC Orders   ED Discharge Orders          Ordered    predniSONE (DELTASONE) 20 MG tablet  Daily with breakfast        09/10/23 1711             Note:  This document was prepared using Dragon voice recognition software and may include unintentional dictation errors.    Lissa Hoard, PA-C 09/10/23 1739    Dionne Bucy, MD 09/10/23 2203

## 2023-09-19 ENCOUNTER — Other Ambulatory Visit (HOSPITAL_COMMUNITY): Payer: Self-pay

## 2023-09-19 MED ORDER — OXYCODONE HCL 10 MG PO TABS
5.0000 mg | ORAL_TABLET | ORAL | 0 refills | Status: AC | PRN
  Filled 2023-09-19: qty 180, 30d supply, fill #0

## 2023-10-21 ENCOUNTER — Emergency Department

## 2023-10-21 ENCOUNTER — Other Ambulatory Visit: Payer: Self-pay

## 2023-10-21 DIAGNOSIS — N390 Urinary tract infection, site not specified: Secondary | ICD-10-CM | POA: Diagnosis not present

## 2023-10-21 DIAGNOSIS — Z7982 Long term (current) use of aspirin: Secondary | ICD-10-CM | POA: Diagnosis not present

## 2023-10-21 DIAGNOSIS — Z720 Tobacco use: Secondary | ICD-10-CM | POA: Insufficient documentation

## 2023-10-21 DIAGNOSIS — R319 Hematuria, unspecified: Secondary | ICD-10-CM | POA: Diagnosis present

## 2023-10-21 LAB — COMPREHENSIVE METABOLIC PANEL WITH GFR
ALT: 15 U/L (ref 0–44)
AST: 18 U/L (ref 15–41)
Albumin: 3.9 g/dL (ref 3.5–5.0)
Alkaline Phosphatase: 48 U/L (ref 38–126)
Anion gap: 10 (ref 5–15)
BUN: 18 mg/dL (ref 6–20)
CO2: 25 mmol/L (ref 22–32)
Calcium: 8.9 mg/dL (ref 8.9–10.3)
Chloride: 105 mmol/L (ref 98–111)
Creatinine, Ser: 1.75 mg/dL — ABNORMAL HIGH (ref 0.44–1.00)
GFR, Estimated: 35 mL/min — ABNORMAL LOW (ref >60)
Glucose, Bld: 95 mg/dL (ref 70–99)
Potassium: 3.9 mmol/L (ref 3.5–5.1)
Sodium: 140 mmol/L (ref 135–145)
Total Bilirubin: 0.6 mg/dL (ref 0.0–1.2)
Total Protein: 6.3 g/dL — ABNORMAL LOW (ref 6.5–8.1)

## 2023-10-21 LAB — URINALYSIS, ROUTINE W REFLEX MICROSCOPIC

## 2023-10-21 LAB — CBC
HCT: 38.7 % (ref 36.0–46.0)
Hemoglobin: 12.7 g/dL (ref 12.0–15.0)
MCH: 28.9 pg (ref 26.0–34.0)
MCHC: 32.8 g/dL (ref 30.0–36.0)
MCV: 88 fL (ref 80.0–100.0)
Platelets: 323 10*3/uL (ref 150–400)
RBC: 4.4 MIL/uL (ref 3.87–5.11)
RDW: 13.4 % (ref 11.5–15.5)
WBC: 9.4 10*3/uL (ref 4.0–10.5)
nRBC: 0 % (ref 0.0–0.2)

## 2023-10-21 LAB — POC URINE PREG, ED: Preg Test, Ur: NEGATIVE

## 2023-10-21 LAB — LIPASE, BLOOD: Lipase: 29 U/L (ref 11–51)

## 2023-10-21 NOTE — ED Triage Notes (Signed)
 Pt to ED via POV c/o lower abd pain, dysuria, and hematuria. Has been going on for about 2 weeks. Has been taking Azo but has not gotten better. Lower abd pain and back pain stared 4 days ago.

## 2023-10-22 ENCOUNTER — Emergency Department
Admission: EM | Admit: 2023-10-22 | Discharge: 2023-10-22 | Disposition: A | Discharge status: Home / Self Care | Attending: Emergency Medicine | Admitting: Emergency Medicine

## 2023-10-22 DIAGNOSIS — N390 Urinary tract infection, site not specified: Secondary | ICD-10-CM

## 2023-10-22 DIAGNOSIS — R319 Hematuria, unspecified: Secondary | ICD-10-CM

## 2023-10-22 MED ORDER — CEPHALEXIN 500 MG PO CAPS
500.0000 mg | ORAL_CAPSULE | Freq: Once | ORAL | Status: AC
  Administered 2023-10-22: 500 mg via ORAL
  Filled 2023-10-22: qty 1

## 2023-10-22 MED ORDER — DIAZEPAM 2 MG PO TABS
2.0000 mg | ORAL_TABLET | Freq: Once | ORAL | Status: AC
  Administered 2023-10-22: 2 mg via ORAL
  Filled 2023-10-22: qty 1

## 2023-10-22 MED ORDER — CEPHALEXIN 500 MG PO CAPS: 500.0000 mg | ORAL_CAPSULE | Freq: Three times a day (TID) | ORAL | 0 refills | Status: AC

## 2023-10-22 MED ORDER — KETOROLAC TROMETHAMINE 30 MG/ML IJ SOLN
15.0000 mg | Freq: Once | INTRAMUSCULAR | Status: AC
  Administered 2023-10-22: 15 mg via INTRAVENOUS
  Filled 2023-10-22: qty 1

## 2023-10-22 MED ORDER — ONDANSETRON HCL 4 MG/2ML IJ SOLN
4.0000 mg | Freq: Once | INTRAMUSCULAR | Status: DC
  Filled 2023-10-22: qty 2

## 2023-10-22 MED ORDER — SODIUM CHLORIDE 0.9 % IV BOLUS
1000.0000 mL | Freq: Once | INTRAVENOUS | Status: AC
  Administered 2023-10-22: 1000 mL via INTRAVENOUS

## 2023-10-22 MED ORDER — DIAZEPAM 2 MG PO TABS: 2.0000 mg | ORAL_TABLET | Freq: Three times a day (TID) | ORAL | 0 refills | Status: AC | PRN

## 2023-10-22 NOTE — Discharge Instructions (Signed)
 Take and finish antibiotic as prescribed.  For the next few days, substitute Valium  instead of Tizanidine  as this will help your bladder spasms.  Return to the ER for worsening symptoms, persistent vomiting, fever or other concerns.

## 2023-10-22 NOTE — ED Provider Notes (Signed)
 [facility] Provider Note    Event Date/Time   First MD Initiated Contact with Patient 10/22/23 0025     (approximate)   History   Dysuria and Abdominal Pain   HPI  Angela Massey is a 48 y.o. female who presents to the ED from home with a chief complaint of lower abdominal pain, dysuria and hematuria x 2 weeks.  Taking AZO but reports no improvement in symptoms.  Denies fever/chills, chest pain, shortness of breath, nausea, vomiting or diarrhea.     Past Medical History   Past Medical History:  Diagnosis Date   Anemia    Anxiety    Arthritis    back and ankles   Depression    Fatty liver    Fibromyalgia    GERD (gastroesophageal reflux disease)    Headache    Hypertriglyceridemia    Ovarian abscess    PCOS (polycystic ovarian syndrome)    PONV (postoperative nausea and vomiting)    Pseudotumor    Sciatica      Active Problem List   Patient Active Problem List   Diagnosis Date Noted   Peripheral neuropathy 01/02/2023   Gastroesophageal reflux disease 01/02/2023   Acne vulgaris 01/02/2023   Bilateral hearing loss 01/02/2023   Tobacco use 01/02/2023   Lumbar radiculopathy 09/20/2021   Degenerative spondylolisthesis 03/24/2021   Cervical lymphadenopathy 09/06/2020   Pharyngeal mass 09/06/2020   GAD (generalized anxiety disorder) 10/11/2015   Panic disorder without agoraphobia 10/11/2015   Insomnia 10/11/2015   Hyperprolactinemia (HCC) 05/26/2013   Depression, major 05/26/2013   Pituitary adenoma (HCC) 05/06/2013     Past Surgical History   Past Surgical History:  Procedure Laterality Date   Diagnostic Laparotomy     LAPAROSCOPIC GASTRIC SLEEVE RESECTION  10/31/2019   planter facitis     TUBAL LIGATION       Home Medications   Prior to Admission medications   Medication Sig Start Date End Date Taking? Authorizing Provider  cephALEXin  (KEFLEX ) 500 MG capsule Take 1 capsule (500 mg total) by mouth 3 (three) times daily.  10/22/23  Yes Norlene Beavers, MD  diazepam  (VALIUM ) 2 MG tablet Take 1 tablet (2 mg total) by mouth every 8 (eight) hours as needed for muscle spasms. 10/22/23  Yes Norlene Beavers, MD  acetaminophen  (TYLENOL ) 650 MG CR tablet Take 1,300 mg by mouth every 8 (eight) hours as needed for pain.    [provider]  aspirin-acetaminophen -caffeine (EXCEDRIN MIGRAINE) 250-250-65 MG tablet Take 2 tablets by mouth every 6 (six) hours as needed for headache.    [provider]  buPROPion  (WELLBUTRIN  XL) 150 MG 24 hr tablet Take 150 mg by mouth every morning. 12/06/21   [provider]  buPROPion  (WELLBUTRIN  XL) 300 MG 24 hr tablet Take 450 mg by mouth daily. Takes with 150 mg    [provider]  doxycycline  (ADOXA) 100 MG tablet Take 100 mg by mouth 2 (two) times daily. 05/02/23   [provider]  doxycycline  (VIBRA -TABS) 100 MG tablet Take 1 tablet (100 mg total) by mouth daily. 01/02/23   Trenton Frock, PA-C  DULoxetine  (CYMBALTA ) 60 MG capsule Take 60 mg by mouth daily.    [provider]  gabapentin  (NEURONTIN ) 600 MG tablet Take 600 mg by mouth 3 (three) times daily. 03/10/23   [provider]  hydrOXYzine  (ATARAX ) 25 MG tablet Take 25 mg by mouth as needed. 12/04/22   [provider]  Melatonin 10  MG CAPS Take 10 mg by mouth See admin instructions. Take 10 mg at bedtime as needed for sleep, may take a second 10 mg dose during the night as needed    [provider]  norethindrone  (AYGESTIN ) 5 MG tablet Take 1 tablet (5 mg total) by mouth daily. 05/23/23   Copland, Alicia B, PA-C  omeprazole  (PRILOSEC) 40 MG capsule Take 1 capsule (40 mg total) by mouth daily. 01/02/23   Drubel, Heidi Llamas, PA-C  ondansetron  (ZOFRAN ) 4 MG tablet TAKE ONE TABLET BY MOUTH EVERY 8 HOURS AS NEEDED FOR NAUSEA OR VOMITING 07/10/23   Ostwalt, Janna, PA-C  Oxycodone  HCl 10 MG TABS Take 0.5-1 tablets (5-10 mg total) by mouth every 4 (four) hours as needed for pain. 09/19/23      spironolactone (ALDACTONE) 50 MG tablet Take 50 mg by mouth daily. 11/27/22   [provider]  tiZANidine  (ZANAFLEX ) 2 MG tablet Take 1 tablet by mouth four times a day. 02/21/22     valACYclovir  (VALTREX ) 1000 MG tablet Take 1,000 mg by mouth as needed. 12/20/22   [provider]     Allergies  Codeine, Phenergan [promethazine hcl], Seroquel [quetiapine fumarate], and Sulfa  antibiotics   Family History   Family History  Problem Relation Age of Onset   Cancer Mother        squamous cell   Asthma Mother    Depression Mother    Anxiety disorder Mother    Cancer Father        squamous cell; nonhodgkin's lymphoma   Alcohol abuse Father    Diabetes Maternal Grandmother    Emphysema Maternal Grandmother    Stroke Paternal Grandfather      Physical Exam  Triage Vital Signs: ED Triage Vitals  Encounter Vitals Group     BP 10/21/23 2114 107/80     Systolic BP Percentile --      Diastolic BP Percentile --      Pulse Rate 10/21/23 2114 72     Resp 10/21/23 2114 18     Temp 10/21/23 2114 98.1 F (36.7 C)     Temp Source 10/21/23 2114 Oral     SpO2 10/21/23 2114 99 %     Weight 10/21/23 2112 133 lb (60.3 kg)     Height 10/21/23 2112 5\' 1"  (1.549 m)     Head Circumference --      Peak Flow --      Pain Score 10/21/23 2112 6     Pain Loc --      Pain Education --      Exclude from Growth Chart --     Updated Vital Signs: BP 109/60 (BP Location: Right Arm)   Pulse 78   Temp 98.1 F (36.7 C) (Oral)   Resp 16   Ht 5\' 1"  (1.549 m)   Wt 60.3 kg   LMP 10/13/2023 (Exact Date)   SpO2 98%   BMI 25.13 kg/m    General: Awake, mild distress.  CV:  RRR.  Good peripheral perfusion.  Resp:  Normal effort.  CTAB. Abd:  Mild suprapubic tenderness to palpation without rebound or guarding.  No CVAT.  No distention.  Other:  No truncal vesicles.   ED Results / Procedures / Treatments  Labs (all labs ordered are listed, but only abnormal results are  displayed) Labs Reviewed  COMPREHENSIVE METABOLIC PANEL WITH GFR - Abnormal; Notable for the following components:      Result Value   Creatinine, Ser 1.75 (*)  Total Protein 6.3 (*)    GFR, Estimated 35 (*)    All other components within normal limits  URINALYSIS, ROUTINE W REFLEX MICROSCOPIC - Abnormal; Notable for the following components:   Color, Urine RED (*)    APPearance TURBID (*)    Glucose, UA   (*)    Value: TEST NOT REPORTED DUE TO COLOR INTERFERENCE OF URINE PIGMENT   Hgb urine dipstick   (*)    Value: TEST NOT REPORTED DUE TO COLOR INTERFERENCE OF URINE PIGMENT   Bilirubin Urine   (*)    Value: TEST NOT REPORTED DUE TO COLOR INTERFERENCE OF URINE PIGMENT   Ketones, ur   (*)    Value: TEST NOT REPORTED DUE TO COLOR INTERFERENCE OF URINE PIGMENT   Protein, ur   (*)    Value: TEST NOT REPORTED DUE TO COLOR INTERFERENCE OF URINE PIGMENT   Nitrite   (*)    Value: TEST NOT REPORTED DUE TO COLOR INTERFERENCE OF URINE PIGMENT   Leukocytes,Ua   (*)    Value: TEST NOT REPORTED DUE TO COLOR INTERFERENCE OF URINE PIGMENT   Bacteria, UA FEW (*)    All other components within normal limits  POC URINE PREG, ED - Normal  URINE CULTURE  LIPASE, BLOOD  CBC     EKG  None   RADIOLOGY I have independently visualized and interpreted patient's imaging study as well as noted the radiology interpretation:  CT renal stone study: Small hiatal hernia, no acute process  Official radiology report(s): CT Renal Stone Study Result Date: 10/22/2023 CLINICAL DATA:  Flank pain. EXAM: CT ABDOMEN AND PELVIS WITHOUT CONTRAST TECHNIQUE: Multidetector CT imaging of the abdomen and pelvis was performed following the standard protocol without IV contrast. RADIATION DOSE REDUCTION: This exam was performed according to the departmental dose-optimization program which includes automated exposure control, adjustment of the mA and/or kV according to patient size and/or use of iterative reconstruction  technique. COMPARISON:  July 29, 2018 FINDINGS: Lower chest: No acute abnormality. Hepatobiliary: No focal liver abnormality is seen. No gallstones, gallbladder wall thickening, or biliary dilatation. Pancreas: Unremarkable. No pancreatic ductal dilatation or surrounding inflammatory changes. Spleen: Normal in size without focal abnormality. Adrenals/Urinary Tract: Adrenal glands are unremarkable. Kidneys are normal, without renal calculi, focal lesion, or hydronephrosis. The urinary bladder is poorly distended and subsequently limited in evaluation. Stomach/Bowel: There is a small hiatal hernia. Surgical sutures are seen throughout the gastric region. Appendix appears normal. No evidence of bowel wall thickening, distention, or inflammatory changes. Vascular/Lymphatic: No significant vascular findings are present. No enlarged abdominal or pelvic lymph nodes. Reproductive: A 3.0 cm simple cyst is seen within the right adnexa. The uterus and left adnexa are unremarkable. Other: No abdominal wall hernia or abnormality. No abdominopelvic ascites. Musculoskeletal: Postoperative changes are seen within the lower lumbar spine. IMPRESSION: 1. Small hiatal hernia. 2. Findings consistent with the patient's history of laparoscopic gastric sleeve resection. 3. Postoperative changes within the lower lumbar spine. 4. No evidence of renal calculi. Electronically Signed   By: Virgle Grime M.D.   On: 10/22/2023 04:17     PROCEDURES:  Critical Care performed: No  Procedures   MEDICATIONS ORDERED IN ED: Medications  ondansetron  (ZOFRAN ) injection 4 mg (4 mg Intravenous Not Given 10/22/23 0106)  sodium chloride  0.9 % bolus 1,000 mL (0 mLs Intravenous Stopped 10/22/23 0200)  ketorolac  (TORADOL ) 30 MG/ML injection 15 mg (15 mg Intravenous Given 10/22/23 0059)  cephALEXin  (KEFLEX ) capsule 500 mg (500 mg Oral  Given 10/22/23 0448)  diazepam  (VALIUM ) tablet 2 mg (2 mg Oral Given 10/22/23 0502)     IMPRESSION / MDM /  ASSESSMENT AND PLAN / ED COURSE  I reviewed the triage vital signs and the nursing notes.                             48 year old female presenting with urinary symptoms and suprapubic pain. Differential diagnosis includes, but is not limited to, ovarian cyst, ovarian torsion, acute appendicitis, diverticulitis, urinary tract infection/pyelonephritis, endometriosis, bowel obstruction, colitis, renal colic, gastroenteritis, hernia, fibroids, endometriosis, pregnancy related pain including ectopic pregnancy, etc. I personally reviewed patient's records and note last ED visit from 09/10/2023 for low back pain and lumbar radiculopathy.  Patient's presentation is most consistent with acute complicated illness / injury requiring diagnostic workup.  Laboratory results demonstrate normal WBC 9.4, AKI with creatinine 1.75 which is increased from prior, UA unable to be resulted due to color interference from AZO but does show 11-20 WBC.  Initiate IV fluid hydration, Ketorolac , Zofran .  Awaiting CT results and reassessment.  Clinical Course as of 10/22/23 0615  Tue Oct 22, 2023  0424 Delay due to etiology.  CT unremarkable for acute process.  Will discharge home on empiric antibiotics, analgesia and patient will follow-up closely with her PCP.  Strict return precautions given.  Patient and spouse verbalized understanding and agree with plan of care. [JS]  [id number] Appreciate pharmacy input on cross-reactivity between Seroquel and Valium .  Patient follows at the pain clinic.  Have asked her to stop her Zanaflex  and instead take Valium  as needed for bladder spasms.  She has an upcoming appointment with her doctor soon.  Strict return precautions given.  Patient and spouse verbalized understanding and agree with plan of care. [JS]    Clinical Course User Index [JS] Norlene Beavers, MD     FINAL CLINICAL IMPRESSION(S) / ED DIAGNOSES   Final diagnoses:  Hematuria, unspecified type  Lower urinary tract infectious  disease     Rx / DC Orders   ED Discharge Orders          Ordered    cephALEXin  (KEFLEX ) 500 MG capsule  3 times daily        10/22/23 0453    diazepam  (VALIUM ) 2 MG tablet  Every 8 hours PRN        10/22/23 0453             Note:  This document was prepared using Dragon voice recognition software and may include unintentional dictation errors.   Norlene Beavers, MD 10/22/23 [id number]

## 2023-10-23 ENCOUNTER — Other Ambulatory Visit (HOSPITAL_COMMUNITY): Payer: Self-pay

## 2023-10-23 MED ORDER — OXYCODONE HCL 10 MG PO TABS
ORAL_TABLET | ORAL | 0 refills | Status: AC
  Filled 2023-10-23: qty 180, 30d supply, fill #0

## 2023-10-24 ENCOUNTER — Other Ambulatory Visit (HOSPITAL_COMMUNITY): Payer: Self-pay

## 2023-10-24 LAB — URINE CULTURE: Culture: >=100000 — AB

## 2023-10-28 ENCOUNTER — Telehealth: Admitting: Physician Assistant

## 2023-10-28 DIAGNOSIS — R39198 Other difficulties with micturition: Secondary | ICD-10-CM

## 2023-10-28 DIAGNOSIS — N179 Acute kidney failure, unspecified: Secondary | ICD-10-CM

## 2023-10-28 DIAGNOSIS — N390 Urinary tract infection, site not specified: Secondary | ICD-10-CM

## 2023-10-28 NOTE — Progress Notes (Signed)
  Because you are having difficulty urinating in the setting a recent Urinary Tract Infection and also recent mild acute kidney injury, I feel your condition warrants further evaluation and I recommend that you be seen in a face-to-face visit.   NOTE: There will be NO CHARGE for this E-Visit   If you are having a true medical emergency, please call 911.     For an urgent face to face visit, [place] has multiple urgent care centers for your convenience.  Click the link below for the full list of locations and hours, walk-in wait times, appointment scheduling options and driving directions:  Urgent Care - [place], [place], [place], [place], [place], [place]  [place]     Your MyChart E-visit questionnaire answers were reviewed by a board certified advanced clinical practitioner to complete your personal care plan based on your specific symptoms.    Thank you for using e-Visits.     I have spent 5 minutes in review of e-visit questionnaire, review and updating patient chart, medical decision making and response to patient.   Angelia Kelp, PA-C

## 2023-11-07 NOTE — Procedures (Signed)
 Test Date:  11/04/2023  Patient: Angela Massey DOB: 1975/11/07 Physician: Dr. Jannett Fairly  Chart#: [id number] Sex: Female Ref Phys: Dr. Jannett Fairly   Patient History: Patient is a 48 year-old female who presents with bilateral leg and foot numbness, pain, Back pain that radiates into both legs.  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  Past medical history is significant for back surgery.   EMG & NCV Findings: All nerve conduction studies (as indicated in the following tables) were within normal limits.    EMG   Side Muscle Nerve Root Ins Act Fibs Psw Amp Dur Poly Recrt Int Bruna Comment  Right Gastroc Tibial S1-2 Nml Nml Nml Nml Nml 0 Nml Nml   Right AntTibialis Dp Br Peron L4-5 Nml Nml Nml Nml Nml 0 Nml Nml   Right Peroneus Long Sup Br Peron L5-S1 Nml Nml Nml Nml Nml 0 Nml Nml   Right VastusLat Femoral L2-4 Nml Nml Nml Nml Nml 0 Nml Nml   Right Lumbo Parasp Up Rami L1-2 Incr 1+ 1+        Right Lumbo Parasp Mid Rami L3-4 Nml Nml Nml        Right Lumbo Parasp Low Rami L5-S1 Nml Nml Nml        Left Gastroc Tibial S1-2 Nml Nml Nml Nml Nml 0 Nml Nml   Left AntTibialis Dp Br Peron L4-5 Nml Nml Nml Nml Nml 0 Nml Nml   Left Peroneus Long Sup Br Peron L5-S1 Nml Nml Nml Nml Nml 0 Nml Nml   Left VastusLat Femoral L2-4 Nml Nml Nml Nml Nml 0 Nml Nml   Left Lumbo Parasp Up Rami L1-2 Incr 1+ 1+        Left Lumbo Parasp Mid Rami L3-4 Nml Nml Nml        Left Lumbo Parasp Low Rami L5-S1 Nml Nml Nml          Impression: This is a normal electrodiagnostic study of both lower limbs.  L-PS showed some post surgical changes.   Thank you for the referral of this patient. It was our privilege to participate in care of your patient.  Feel free to contact us  with any further questions.   _____________________________ Jannett Fairly, M.D.  Nerve Conduction Studies Anti Sensory Summary Table   Stim Site NR Peak (ms) Norm Peak (ms) P-T Amp (V) Norm P-T Amp Site1 Site2 Dist (cm) Vel  (m/s) Norm Vel (m/s)  Left Sup Peron Anti Sensory (Ant Lat Mall)  14 cm    3.5 <4.4 10.4 >5.0 14 cm Ant Lat Mall 14.0 40 >32      14.8  2.4        Right Sup Peron Anti Sensory (Ant Lat Mall)  14 cm    3.3 <4.4 41.9 >5.0 14 cm Ant Lat Mall 14.0 42 >32  Left Sural Anti Sensory (Lat Mall)  Calf    3.1 <4.0 42.2 >5.0 Calf Lat Mall 14.0 45 >35  Right Sural Anti Sensory (Lat Mall)  Calf    3.6 <4.0 22.2 >5.0 Calf Lat Mall 14.0 39 >35   Motor Summary Table   Stim Site NR Onset (ms) Norm Onset (ms) O-P Amp (mV) Norm O-P Amp Site1 Site2 Dist (cm) Vel (m/s) Norm Vel (m/s)  Left Peroneal Motor (Ext Dig Brev)  Ankle    4.2 <6.6 3.1 >2.0 B Fib Ankle 32.0 51 >38  B Fib    10.5  2.3  Poplt B Fib 8.5  121 >40  Poplt    11.2  3.5        Right Peroneal Motor (Ext Dig Brev)  Ankle    4.5 <6.6 4.1 >2.0 B Fib Ankle 30.5 48 >38  B Fib    10.9  3.3  Poplt B Fib 8.5 65 >40  Poplt    12.2  3.2        Left Tibial Motor (Abd Hall Brev)  Ankle    4.2 <6.6 3.8 >2.0 Knee Ankle 34.5 46 >42  Knee    11.7  3.8        Right Tibial Motor (Abd Hall Brev)  Ankle    3.9 <6.6 5.0 >2.0 Knee Ankle 36.0 42 >42  Knee    12.5  3.6          Waveforms:

## 2023-11-21 ENCOUNTER — Other Ambulatory Visit (HOSPITAL_COMMUNITY): Payer: Self-pay

## 2023-11-21 MED ORDER — OXYCODONE HCL 10 MG PO TABS
5.0000 mg | ORAL_TABLET | ORAL | 0 refills | Status: DC | PRN
Start: 2023-11-21 — End: 2023-12-23
  Filled 2023-11-21: qty 180, 30d supply, fill #0

## 2023-12-23 ENCOUNTER — Other Ambulatory Visit: Payer: Self-pay

## 2023-12-23 ENCOUNTER — Other Ambulatory Visit (HOSPITAL_COMMUNITY): Payer: Self-pay

## 2023-12-23 MED ORDER — OXYCODONE HCL 10 MG PO TABS
5.0000 mg | ORAL_TABLET | ORAL | 0 refills | Status: DC | PRN
  Filled 2023-12-23: qty 180, 30d supply, fill #0

## 2024-01-05 ENCOUNTER — Other Ambulatory Visit: Payer: Self-pay

## 2024-01-05 ENCOUNTER — Emergency Department

## 2024-01-05 ENCOUNTER — Emergency Department
Admission: EM | Admit: 2024-01-05 | Discharge: 2024-01-05 | Disposition: A | Discharge status: Home / Self Care | Attending: Emergency Medicine | Admitting: Emergency Medicine

## 2024-01-05 DIAGNOSIS — R1032 Left lower quadrant pain: Secondary | ICD-10-CM | POA: Diagnosis present

## 2024-01-05 DIAGNOSIS — N390 Urinary tract infection, site not specified: Secondary | ICD-10-CM | POA: Diagnosis not present

## 2024-01-05 HISTORY — DX: Urinary tract infection, site not specified: N39.0

## 2024-01-05 HISTORY — DX: Tubulo-interstitial nephritis, not specified as acute or chronic: N12

## 2024-01-05 LAB — COMPREHENSIVE METABOLIC PANEL WITH GFR
ALT: 19 U/L (ref 0–44)
AST: 18 U/L (ref 15–41)
Albumin: 3.6 g/dL (ref 3.5–5.0)
Alkaline Phosphatase: 49 U/L (ref 38–126)
Anion gap: 8 (ref 5–15)
BUN: 9 mg/dL (ref 6–20)
CO2: 28 mmol/L (ref 22–32)
Calcium: 9 mg/dL (ref 8.9–10.3)
Chloride: 106 mmol/L (ref 98–111)
Creatinine, Ser: 0.61 mg/dL (ref 0.44–1.00)
GFR, Estimated: >60 mL/min (ref >60)
Glucose, Bld: 80 mg/dL (ref 70–99)
Potassium: 4 mmol/L (ref 3.5–5.1)
Sodium: 142 mmol/L (ref 135–145)
Total Bilirubin: 0.2 mg/dL (ref 0.0–1.2)
Total Protein: 6.1 g/dL — ABNORMAL LOW (ref 6.5–8.1)

## 2024-01-05 LAB — CBC
HCT: 36.9 % (ref 36.0–46.0)
Hemoglobin: 12.1 g/dL (ref 12.0–15.0)
MCH: 28.9 pg (ref 26.0–34.0)
MCHC: 32.8 g/dL (ref 30.0–36.0)
MCV: 88.3 fL (ref 80.0–100.0)
Platelets: 287 K/uL (ref 150–400)
RBC: 4.18 MIL/uL (ref 3.87–5.11)
RDW: 12.6 % (ref 11.5–15.5)
WBC: 7.2 K/uL (ref 4.0–10.5)
nRBC: 0 % (ref 0.0–0.2)

## 2024-01-05 LAB — URINALYSIS, ROUTINE W REFLEX MICROSCOPIC
Bilirubin Urine: NEGATIVE
Glucose, UA: NEGATIVE mg/dL
Ketones, ur: NEGATIVE mg/dL
Nitrite: NEGATIVE
Protein, ur: NEGATIVE mg/dL
Specific Gravity, Urine: 1.019 (ref 1.005–1.030)
pH: 6 (ref 5.0–8.0)

## 2024-01-05 LAB — POC URINE PREG, ED: Preg Test, Ur: NEGATIVE

## 2024-01-05 LAB — LIPASE, BLOOD: Lipase: 34 U/L (ref 11–51)

## 2024-01-05 MED ORDER — ONDANSETRON 4 MG PO TBDP: 4.0000 mg | ORAL_TABLET | Freq: Three times a day (TID) | ORAL | 0 refills | Status: AC | PRN

## 2024-01-05 MED ORDER — CEPHALEXIN 500 MG PO CAPS
500.0000 mg | ORAL_CAPSULE | Freq: Once | ORAL | Status: AC
  Administered 2024-01-05: 500 mg via ORAL
  Filled 2024-01-05: qty 1

## 2024-01-05 MED ORDER — IOHEXOL 300 MG/ML  SOLN
100.0000 mL | Freq: Once | INTRAMUSCULAR | Status: AC | PRN
  Administered 2024-01-05: 100 mL via INTRAVENOUS

## 2024-01-05 MED ORDER — CEFDINIR 300 MG PO CAPS: 300.0000 mg | ORAL_CAPSULE | Freq: Two times a day (BID) | ORAL | 0 refills | Status: AC

## 2024-01-05 NOTE — Discharge Instructions (Signed)
 Take the antibiotic as prescribed and finish the full 7-day course.  You may use the Zofran  as needed for nausea.  Return to the ER for new, worsening, or persistent severe abdominal pain, vomiting, fever, weakness, or any other new or worsening symptoms that concern you.

## 2024-01-05 NOTE — ED Triage Notes (Signed)
 Pt to ED for LUQ and LLQ abdominal pain since 2.5 weeks. Pain is worse while eating. Pain is sharp, cramping and like pressure. Hx gastric sleeve surgery. Also has been constipated for 1 year and takes a lot of laxatives to have BM. Having daily BM's that are small. Pt says she feels and looks bloated and that L abdomen is tender to touch.

## 2024-01-05 NOTE — ED Provider Notes (Signed)
 [facility] Provider Note    Event Date/Time   First MD Initiated Contact with Patient 01/05/24 1649     (approximate)   History   Abdominal Pain   HPI  Angela Massey is a 48 y.o. female status post gastric sleeve and with history of GERD, fibromyalgia, sciatica, anxiety, depression who presents with left-sided abdominal pain for the last 2 weeks, intermittent course, worse after she eats or drinks anything.  She states that it is both in the upper and lower abdomen.  Sometimes the pain will be worse when she urinates, although she denies any actual dysuria or frequency.  She reports chronic constipation and has to take laxatives.  She reports some nausea and intermittent vomiting.  The last vomiting episode was 2 days ago.  She has no fever or chills.  I reviewed the past medical records.  The patient's most recent outpatient encounter was with neurology at [facility] on 5/5 for bilateral foot numbness.  She has no recent hospitalizations.   Physical Exam   Triage Vital Signs: ED Triage Vitals  Encounter Vitals Group     BP 01/05/24 1603 114/67     Girls Systolic BP Percentile --      Girls Diastolic BP Percentile --      Boys Systolic BP Percentile --      Boys Diastolic BP Percentile --      Pulse Rate 01/05/24 1603 64     Resp 01/05/24 1603 18     Temp 01/05/24 1603 98.3 F (36.8 C)     Temp Source 01/05/24 1603 Oral     SpO2 01/05/24 1603 100 %     Weight 01/05/24 1604 125 lb (56.7 kg)     Height 01/05/24 1604 5' 1 (1.549 m)     Head Circumference --      Peak Flow --      Pain Score 01/05/24 1600 8     Pain Loc --      Pain Education --      Exclude from Growth Chart --     Most recent vital signs: Vitals:   01/05/24 1725 01/05/24 1830  BP: 105/69 (!) 97/58  Pulse: (!) 58 61  Resp: 17 17  Temp:    SpO2: 100% 100%     General: Alert, relatively well-appearing, no distress.  CV:  Good peripheral perfusion.  Resp:  Normal effort.   Abd:  Mild to moderate left upper and lower quadrant tenderness.  No peritoneal signs.  No distention.  Other:  No jaundice or scleral icterus.   ED Results / Procedures / Treatments   Labs (all labs ordered are listed, but only abnormal results are displayed) Labs Reviewed  COMPREHENSIVE METABOLIC PANEL WITH GFR - Abnormal; Notable for the following components:      Result Value   Total Protein 6.1 (*)    All other components within normal limits  URINALYSIS, ROUTINE W REFLEX MICROSCOPIC - Abnormal; Notable for the following components:   Color, Urine YELLOW (*)    APPearance CLOUDY (*)    Hgb urine dipstick SMALL (*)    Leukocytes,Ua TRACE (*)    Bacteria, UA RARE (*)    All other components within normal limits  LIPASE, BLOOD  CBC  POC URINE PREG, ED     EKG     RADIOLOGY  CT abdomen/pelvis: I independently viewed and interpreted the images;   IMPRESSION:  1. No acute findings in the abdomen pelvis.  2. No evidence of diverticulitis.  3. Post gastric sleeve bariatric surgery anatomy. Small hiatal  hernia.  4. Left Bosniak II benign renal cyst measuring 1.1 cm. No follow-up  imaging is recommended. JACR 2018 Feb; 264-273, Management of the  Incidental Renal Mass on CT, [employer] 2021; [phone], Bosniak  Classification of Cystic Renal Masses, Version 2019.    PROCEDURES:  Critical Care performed: No  Procedures   MEDICATIONS ORDERED IN ED: Medications  cephALEXin  (KEFLEX ) capsule 500 mg (has no administration in time range)  iohexol  (OMNIPAQUE ) 300 MG/ML solution 100 mL (100 mLs Intravenous Contrast Given 01/05/24 1752)     IMPRESSION / MDM / ASSESSMENT AND PLAN / ED COURSE  I reviewed the triage vital signs and the nursing notes.  48 year old female with PMH as noted above presents with left-sided abdominal pain over the last 2 weeks associated with chronic constipation as well as nausea and intermittent vomiting.  On exam her vital signs are  normal.  She has some tenderness throughout the left side of the abdomen.  Differential diagnosis includes, but is not limited to, diverticulitis, colitis, gastritis, PUD, constipation, UTI/pyelonephritis, less likely small bowel obstruction.  We will obtain lab workup, CT, and reassess.  Patient's presentation is most consistent with acute complicated illness / injury requiring diagnostic workup.  ----------------------------------------- 7:24 PM on 01/05/2024 -----------------------------------------  Lab workup is reassuring.  CMP shows no acute findings.  CBC shows no leukocytosis.  Urinalysis does show findings concerning for possible UTI with 11-20 WBCs.  CT is negative for acute abnormality.  Overall I suspect that the patient's pain is either due to chronic constipation or a UTI.  It is unlikely to be pyelonephritis.  She has no back or flank pain.  The patient feels comfortable and is stable for discharge home at this time.  I will treat her with a course of Omnicef  and have given a dose of Keflex  in the ED.  I counseled her on the results of the workup and plan of care.  I gave strict return precautions, and she expressed understanding.   FINAL CLINICAL IMPRESSION(S) / ED DIAGNOSES   Final diagnoses:  Left lower quadrant abdominal pain  Urinary tract infection without hematuria, site unspecified     Rx / DC Orders   ED Discharge Orders          Ordered    cefdinir  (OMNICEF ) 300 MG capsule  2 times daily        01/05/24 1923    ondansetron  (ZOFRAN -ODT) 4 MG disintegrating tablet  Every 8 hours PRN        01/05/24 1923             Note:  This document was prepared using Dragon voice recognition software and may include unintentional dictation errors.    Jacolyn Pae, MD 01/05/24 [phone]

## 2024-01-23 ENCOUNTER — Other Ambulatory Visit (HOSPITAL_COMMUNITY): Payer: Self-pay

## 2024-01-23 MED ORDER — OXYCODONE HCL 10 MG PO TABS
5.0000 mg | ORAL_TABLET | ORAL | 0 refills | Status: DC | PRN
  Filled 2024-01-23: qty 180, 30d supply, fill #0

## 2024-02-18 ENCOUNTER — Other Ambulatory Visit (HOSPITAL_COMMUNITY): Payer: Self-pay

## 2024-02-18 MED ORDER — OXYCODONE HCL 10 MG PO TABS
5.0000 mg | ORAL_TABLET | ORAL | 0 refills | Status: DC | PRN
  Filled 2024-02-18 – 2024-02-20 (×2): qty 180, 30d supply, fill #0

## 2024-02-19 ENCOUNTER — Other Ambulatory Visit (HOSPITAL_COMMUNITY): Payer: Self-pay

## 2024-02-20 ENCOUNTER — Other Ambulatory Visit: Payer: Self-pay

## 2024-02-20 ENCOUNTER — Other Ambulatory Visit (HOSPITAL_COMMUNITY): Payer: Self-pay

## 2024-03-16 ENCOUNTER — Other Ambulatory Visit (HOSPITAL_COMMUNITY): Payer: Self-pay

## 2024-03-16 MED ORDER — OXYCODONE HCL 10 MG PO TABS
5.0000 mg | ORAL_TABLET | ORAL | 0 refills | Status: AC | PRN
  Filled 2024-03-16 – 2024-03-20 (×2): qty 180, 30d supply, fill #0

## 2024-03-16 MED ORDER — GABAPENTIN 800 MG PO TABS
800.0000 mg | ORAL_TABLET | Freq: Three times a day (TID) | ORAL | 2 refills | Status: AC
  Filled 2024-03-16: qty 90, 30d supply, fill #0

## 2024-03-16 MED ORDER — OXYCODONE HCL 10 MG PO TABS
5.0000 mg | ORAL_TABLET | ORAL | 0 refills | Status: DC | PRN
  Filled 2024-04-18: qty 18, 3d supply, fill #0
  Filled 2024-04-18: qty 180, 30d supply, fill #0
  Filled 2024-04-18: qty 162, 27d supply, fill #0

## 2024-03-17 ENCOUNTER — Other Ambulatory Visit (HOSPITAL_COMMUNITY): Payer: Self-pay

## 2024-03-18 ENCOUNTER — Telehealth: Payer: Self-pay | Admitting: Physician Assistant

## 2024-03-18 ENCOUNTER — Other Ambulatory Visit (HOSPITAL_COMMUNITY): Payer: Self-pay

## 2024-03-18 NOTE — Telephone Encounter (Signed)
Pt needs to schedule an appointment.

## 2024-03-18 NOTE — Telephone Encounter (Signed)
 [facility] faxed refill request for the following medications:  omeprazole  (PRILOSEC) 40 MG capsule    Please advise.

## 2024-03-19 ENCOUNTER — Other Ambulatory Visit (HOSPITAL_COMMUNITY): Payer: Self-pay

## 2024-03-19 ENCOUNTER — Other Ambulatory Visit: Payer: Self-pay

## 2024-03-20 ENCOUNTER — Other Ambulatory Visit (HOSPITAL_BASED_OUTPATIENT_CLINIC_OR_DEPARTMENT_OTHER): Payer: Self-pay

## 2024-03-20 ENCOUNTER — Other Ambulatory Visit (HOSPITAL_COMMUNITY): Payer: Self-pay

## 2024-03-24 NOTE — Telephone Encounter (Signed)
 Called pt to schedule appt before refilling medication and the pt states that she is now being seen through [facility] and has an appt on Thursday to have medication refilled and she did not realize that the pharmacy would send us  the refill request.

## 2024-04-17 ENCOUNTER — Other Ambulatory Visit (HOSPITAL_COMMUNITY): Payer: Self-pay

## 2024-04-18 ENCOUNTER — Other Ambulatory Visit (HOSPITAL_COMMUNITY): Payer: Self-pay

## 2024-04-20 ENCOUNTER — Other Ambulatory Visit (HOSPITAL_COMMUNITY): Payer: Self-pay

## 2024-04-20 ENCOUNTER — Other Ambulatory Visit: Payer: Self-pay

## 2024-05-19 ENCOUNTER — Other Ambulatory Visit (HOSPITAL_COMMUNITY): Payer: Self-pay

## 2024-05-19 ENCOUNTER — Other Ambulatory Visit: Payer: Self-pay

## 2024-05-19 MED ORDER — OXYCODONE HCL 10 MG PO TABS
10.0000 mg | ORAL_TABLET | ORAL | 0 refills | Status: DC | PRN
  Filled 2024-05-19: qty 180, 30d supply, fill #0

## 2024-06-17 ENCOUNTER — Other Ambulatory Visit (HOSPITAL_COMMUNITY): Payer: Self-pay

## 2024-06-17 MED ORDER — OXYCODONE HCL 10 MG PO TABS
10.0000 mg | ORAL_TABLET | ORAL | 0 refills | Status: DC | PRN
  Filled 2024-06-17: qty 180, 30d supply, fill #0

## 2024-07-20 ENCOUNTER — Other Ambulatory Visit (HOSPITAL_COMMUNITY): Payer: Self-pay

## 2024-07-20 MED ORDER — OXYCODONE HCL 10 MG PO TABS
10.0000 mg | ORAL_TABLET | ORAL | 0 refills | Status: AC | PRN
  Filled 2024-07-20: qty 180, 30d supply, fill #0
